# Patient Record
Sex: Male | Born: 1980 | Race: Black or African American | Hispanic: No | Marital: Married | Smoking: Current some day smoker
Health system: Southern US, Community
[De-identification: ages and names within clinical notes are randomized; demographics above are authoritative.]

## PROBLEM LIST (undated history)

## (undated) DIAGNOSIS — F32A Depression, unspecified: Secondary | ICD-10-CM

## (undated) DIAGNOSIS — R55 Syncope and collapse: Secondary | ICD-10-CM

## (undated) DIAGNOSIS — E785 Hyperlipidemia, unspecified: Secondary | ICD-10-CM

## (undated) DIAGNOSIS — D860 Sarcoidosis of lung: Secondary | ICD-10-CM

## (undated) DIAGNOSIS — I10 Essential (primary) hypertension: Secondary | ICD-10-CM

## (undated) DIAGNOSIS — Z9109 Other allergy status, other than to drugs and biological substances: Secondary | ICD-10-CM

## (undated) HISTORY — DX: Depression, unspecified: F32.A

## (undated) HISTORY — DX: Sarcoidosis of lung: D86.0

## (undated) HISTORY — PX: LUNG BIOPSY: SHX232

## (undated) HISTORY — DX: Essential (primary) hypertension: I10

## (undated) HISTORY — DX: Hyperlipidemia, unspecified: E78.5

## (undated) HISTORY — DX: Syncope and collapse: R55

## (undated) HISTORY — PX: WISDOM TOOTH EXTRACTION: SHX21

## (undated) HISTORY — DX: Other allergy status, other than to drugs and biological substances: Z91.09

## (undated) HISTORY — PX: SHOULDER SURGERY: SHX246

---

## 2011-06-10 DIAGNOSIS — M25569 Pain in unspecified knee: Secondary | ICD-10-CM | POA: Insufficient documentation

## 2011-06-10 DIAGNOSIS — M1710 Unilateral primary osteoarthritis, unspecified knee: Secondary | ICD-10-CM | POA: Insufficient documentation

## 2011-07-25 DIAGNOSIS — J45909 Unspecified asthma, uncomplicated: Secondary | ICD-10-CM | POA: Insufficient documentation

## 2011-07-25 DIAGNOSIS — D869 Sarcoidosis, unspecified: Secondary | ICD-10-CM | POA: Insufficient documentation

## 2011-12-23 DIAGNOSIS — M722 Plantar fascial fibromatosis: Secondary | ICD-10-CM | POA: Insufficient documentation

## 2012-01-28 DIAGNOSIS — Z9109 Other allergy status, other than to drugs and biological substances: Secondary | ICD-10-CM | POA: Insufficient documentation

## 2012-01-29 DIAGNOSIS — F17200 Nicotine dependence, unspecified, uncomplicated: Secondary | ICD-10-CM | POA: Insufficient documentation

## 2014-01-01 ENCOUNTER — Emergency Department (HOSPITAL_COMMUNITY)
Admission: EM | Admit: 2014-01-01 | Discharge: 2014-01-02 | Disposition: A | Discharge status: Home / Self Care | Payer: BC Managed Care – PPO | Attending: Emergency Medicine | Admitting: Emergency Medicine

## 2014-01-01 ENCOUNTER — Encounter (HOSPITAL_COMMUNITY): Payer: Self-pay | Admitting: Emergency Medicine

## 2014-01-01 DIAGNOSIS — Z720 Tobacco use: Secondary | ICD-10-CM | POA: Insufficient documentation

## 2014-01-01 DIAGNOSIS — R51 Headache: Secondary | ICD-10-CM | POA: Diagnosis present

## 2014-01-01 DIAGNOSIS — R110 Nausea: Secondary | ICD-10-CM | POA: Diagnosis not present

## 2014-01-01 DIAGNOSIS — R519 Headache, unspecified: Secondary | ICD-10-CM

## 2014-01-01 LAB — CBC WITH DIFFERENTIAL/PLATELET
Basophils Absolute: 0 10*3/uL (ref 0.0–0.1)
Basophils Relative: 0 % (ref 0–1)
EOS ABS: 0.2 10*3/uL (ref 0.0–0.7)
Eosinophils Relative: 4 % (ref 0–5)
HCT: 43.1 % (ref 39.0–52.0)
HEMOGLOBIN: 15.1 g/dL (ref 13.0–17.0)
Lymphocytes Relative: 36 % (ref 12–46)
Lymphs Abs: 1.6 10*3/uL (ref 0.7–4.0)
MCH: 27.9 pg (ref 26.0–34.0)
MCHC: 35 g/dL (ref 30.0–36.0)
MCV: 79.5 fL (ref 78.0–100.0)
MONOS PCT: 8 % (ref 3–12)
Monocytes Absolute: 0.4 10*3/uL (ref 0.1–1.0)
NEUTROS PCT: 52 % (ref 43–77)
Neutro Abs: 2.3 10*3/uL (ref 1.7–7.7)
Platelets: 206 10*3/uL (ref 150–400)
RBC: 5.42 MIL/uL (ref 4.22–5.81)
RDW: 13.2 % (ref 11.5–15.5)
WBC: 4.5 10*3/uL (ref 4.0–10.5)

## 2014-01-01 MED ORDER — METOCLOPRAMIDE HCL 5 MG/ML IJ SOLN
10.0000 mg | Freq: Once | INTRAMUSCULAR | Status: AC
  Administered 2014-01-01: 10 mg via INTRAVENOUS
  Filled 2014-01-01: qty 2

## 2014-01-01 MED ORDER — SODIUM CHLORIDE 0.9 % IV BOLUS (SEPSIS)
1000.0000 mL | Freq: Once | INTRAVENOUS | Status: AC
  Administered 2014-01-01: 1000 mL via INTRAVENOUS

## 2014-01-01 MED ORDER — DIPHENHYDRAMINE HCL 50 MG/ML IJ SOLN
50.0000 mg | Freq: Once | INTRAMUSCULAR | Status: AC
Start: 2014-01-01 — End: 2014-01-01
  Administered 2014-01-01: 50 mg via INTRAVENOUS
  Filled 2014-01-01: qty 1

## 2014-01-01 MED ORDER — KETOROLAC TROMETHAMINE 30 MG/ML IJ SOLN
30.0000 mg | Freq: Once | INTRAMUSCULAR | Status: AC
  Administered 2014-01-01: 30 mg via INTRAVENOUS
  Filled 2014-01-01: qty 1

## 2014-01-01 MED ORDER — OXYCODONE-ACETAMINOPHEN 5-325 MG PO TABS
1.0000 | ORAL_TABLET | Freq: Once | ORAL | Status: AC
  Administered 2014-01-01: 1 via ORAL
  Filled 2014-01-01: qty 1

## 2014-01-01 NOTE — ED Provider Notes (Signed)
CSN: [id number]     Arrival date & time 01/01/14  1820 History   First MD Initiated Contact with Patient 01/01/14 2255     Chief Complaint  Patient presents with  . Headache  . Nausea     (Consider location/radiation/quality/duration/timing/severity/associated sxs/prior Treatment) HPI Dominic Joseph is a 33 y.o. male with no significant past medical history coming in with a headache. Patient states this occurred 4 days ago, and was sudden onset. He does not have a history of headaches and states it was at its worse 4 days ago. He has been off and on since then, feels goal with episodes of sharp pains. He describes left hand numbness and tingling black spots as well. One day while going to work, he states he got lost leaving from his house which never happened to him. He denies fevers but admits to chills. He states he feels weak all over. 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 He also has a positive family history of brain aneurysms. Patient also had lightheadedness when he stands. He denies any chest pain or shortness of breath. Patient used B.C. powder without any relief. Patient has no further complaints.  10 Systems reviewed and are negative for acute change except as noted in the HPI.     History reviewed. No pertinent past medical history. History reviewed. No pertinent past surgical history. No family history on file. History  Substance Use Topics  . Smoking status: Current Every Day Smoker -- 1.00 packs/day    Types: Cigarettes  . Smokeless tobacco: Not on file  . Alcohol Use: No    Review of Systems    Allergies  Review of patient's allergies indicates no known allergies.  Home Medications   Prior to Admission medications   Not on File   BP 123/70  Pulse 53  Temp(Src) 98.5 F (36.9 C) (Oral)  Resp 15  Ht 6\' 3"  (1.905 m)  Wt 253 lb 7 oz (114.958 kg)  BMI 31.68 kg/m2  SpO2 100% Physical Exam  Nursing note and vitals  reviewed. Constitutional: He is oriented to person, place, and time. Vital signs are normal. He appears well-developed and well-nourished.  Non-toxic appearance. He does not appear ill. No distress.  HENT:  Head: Normocephalic and atraumatic.  Nose: Nose normal.  Mouth/Throat: Oropharynx is clear and moist. No oropharyngeal exudate.  Eyes: Conjunctivae and EOM are normal. Pupils are equal, round, and reactive to light. No scleral icterus.  Neck: Normal range of motion. Neck supple. No tracheal deviation, no edema, no erythema and normal range of motion present. No mass and no thyromegaly present.  Cardiovascular: Normal rate, regular rhythm, S1 normal, S2 normal, normal heart sounds, intact distal pulses and normal pulses.  Exam reveals no gallop and no friction rub.   No murmur heard. Pulses:      Radial pulses are 2+ on the right side, and 2+ on the left side.       Dorsalis pedis pulses are 2+ on the right side, and 2+ on the left side.  Pulmonary/Chest: Effort normal and breath sounds normal. No respiratory distress. He has no wheezes. He has no rhonchi. He has no rales.  Abdominal: Soft. Normal appearance and bowel sounds are normal. He exhibits no distension, no ascites and no mass. There is no hepatosplenomegaly. There is no tenderness. There is no rebound, no guarding and no CVA tenderness.  Musculoskeletal: Normal range of motion. He exhibits no edema and no tenderness.  Lymphadenopathy:    He has no cervical adenopathy.  Neurological: He is alert and oriented to person, place, and time. He has normal strength. No cranial nerve deficit or sensory deficit. He exhibits normal muscle tone. Coordination normal. GCS eye subscore is 4. GCS verbal subscore is 5. GCS motor subscore is 6.  Normal sensation and 5 out of 5 strength x4 extremities. Normal cerebellar testing.  Skin: Skin is warm, dry and intact. No petechiae and no rash noted. He is not diaphoretic. No erythema. No pallor.   Psychiatric: He has a normal mood and affect. His behavior is normal. Judgment normal.    ED Course  LUMBAR PUNCTURE Date/Time: 01/02/2014 1:27 AM Performed by: Tomasita CrumbleNI, Annlee Glandon Authorized by: Tomasita CrumbleNI, Arwin Bisceglia Consent: Verbal consent obtained. written consent obtained. Risks and benefits: risks, benefits and alternatives were discussed Consent given by: patient Patient understanding: patient states understanding of the procedure being performed Patient consent: the patient's understanding of the procedure matches consent given Procedure consent: procedure consent matches procedure scheduled Relevant documents: relevant documents present and verified Test results: test results available and properly labeled Site marked: the operative site was marked Imaging studies: imaging studies available Patient identity confirmed: verbally with patient and hospital-assigned identification number Time out: Immediately prior to procedure a "time out" was called to verify the correct patient, procedure, equipment, support staff and site/side marked as required. Indications: evaluation for subarachnoid hemorrhage and evaluation for altered mental status Anesthesia: local infiltration Local anesthetic: lidocaine 1% with epinephrine Anesthetic total: 4 ml Patient sedated: no Preparation: Patient was prepped and draped in the usual sterile fashion. Lumbar space: L3-L4 interspace Patient's position: right lateral decubitus Needle gauge: 22 Needle type: spinal needle - Quincke tip Number of attempts: 2 Opening pressure: 25 cm H2O Fluid appearance: clear Tubes of fluid: 4 Total volume: 4 ml Post-procedure: site cleaned, pressure dressing applied and adhesive bandage applied Patient tolerance: Patient tolerated the procedure well with no immediate complications.   (including critical care time) Labs Review Labs Reviewed  BASIC METABOLIC PANEL - Abnormal; Notable for the following:    GFR calc non Af Amer  82 (*)    All other components within normal limits  CSF CELL COUNT WITH DIFFERENTIAL - Abnormal; Notable for the following:    Appearance, CSF CLEAR (*)    RBC Count, CSF 186 (*)    All other components within normal limits  CSF CELL COUNT WITH DIFFERENTIAL - Abnormal; Notable for the following:    Appearance, CSF CLEAR (*)    RBC Count, CSF 2 (*)    All other components within normal limits  GRAM STAIN  CSF CULTURE  CBC WITH DIFFERENTIAL  GLUCOSE, CSF  PROTEIN, CSF    Imaging Review Ct Head Wo Contrast  01/02/2014   CLINICAL DATA:  Headache on the left for 4 days. Nausea. Initial encounter.  EXAM: CT HEAD WITHOUT CONTRAST  TECHNIQUE: Contiguous axial images were obtained from the base of the skull through the vertex without intravenous contrast.  COMPARISON:  None.  FINDINGS: Skull and Sinuses:Negative for fracture or destructive process. There is patchy inflammatory mucosal thickening in bilateral ethmoid sinuses without sinus effusion.  Orbits: No acute abnormality.  Brain: No evidence of acute abnormality, such as acute infarction, hemorrhage, hydrocephalus, or mass lesion/mass effect.  IMPRESSION: 1. No acute intracranial findings. 2. Mild ethmoid sinusitis.   Electronically Signed   By: Tiburcio PeaJonathan  Watts M.D.   On: 01/02/2014 00:28     EKG Interpretation None  MDM   Final diagnoses:  None    Patient presents emergency department for headache, left hand numbness and seeing black spots.   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 Will obtain CT scan and perform lumbar puncture if negative.  Laboratory values and CT scan of head were negative. Lumbar puncture performed did not reveal any viral meningitis or subarachnoid hemorrhage. Patient's headache is improved after Reglan and Toradol. Will be  sent home with a prescription and advised to followup with primary care physician within 3 days for continued evaluation. His vital signs remain within his normal limits and is safe for discharge.   Tomasita CrumbleAdeleke Ulani Degrasse, MD 01/02/14 0300

## 2014-01-01 NOTE — ED Notes (Signed)
Pt presents for headache to the left side of head for the past 4 days. Woke up with the HA. Has some tingling down his left arm. Nauseated. No vomiting. Seeing spots. Sensitive to light and sounds. No hx of migraines.

## 2014-01-01 NOTE — ED Notes (Signed)
EDP at bedside  

## 2014-01-02 ENCOUNTER — Emergency Department (HOSPITAL_COMMUNITY): Payer: BC Managed Care – PPO

## 2014-01-02 DIAGNOSIS — R51 Headache: Secondary | ICD-10-CM | POA: Diagnosis not present

## 2014-01-02 LAB — GRAM STAIN: Gram Stain: NONE SEEN

## 2014-01-02 LAB — BASIC METABOLIC PANEL
Anion gap: 13 (ref 5–15)
BUN: 17 mg/dL (ref 6–23)
CALCIUM: 9 mg/dL (ref 8.4–10.5)
CO2: 22 mEq/L (ref 19–32)
Chloride: 105 mEq/L (ref 96–112)
Creatinine, Ser: 1.15 mg/dL (ref 0.50–1.35)
GFR calc Af Amer: >90 mL/min (ref >90)
GFR, EST NON AFRICAN AMERICAN: 82 mL/min — AB (ref >90)
GLUCOSE: 93 mg/dL (ref 70–99)
POTASSIUM: 4.1 meq/L (ref 3.7–5.3)
Sodium: 140 mEq/L (ref 137–147)

## 2014-01-02 LAB — CSF CELL COUNT WITH DIFFERENTIAL
RBC COUNT CSF: 186 /mm3 — AB
RBC Count, CSF: 2 /mm3 — ABNORMAL HIGH
TUBE #: 4
Tube #: 1
WBC CSF: 0 /mm3 (ref 0–5)
WBC, CSF: 0 /mm3 (ref 0–5)

## 2014-01-02 LAB — GLUCOSE, CSF: Glucose, CSF: 62 mg/dL (ref 43–76)

## 2014-01-02 LAB — PROTEIN, CSF: TOTAL PROTEIN, CSF: 29 mg/dL (ref 15–45)

## 2014-01-02 MED ORDER — LIDOCAINE-EPINEPHRINE 1 %-1:100000 IJ SOLN
10.0000 mL | Freq: Once | INTRAMUSCULAR | Status: AC
  Administered 2014-01-02: 10 mL via INTRADERMAL
  Filled 2014-01-02: qty 1

## 2014-01-02 MED ORDER — METOCLOPRAMIDE HCL 10 MG PO TABS: 10.0000 mg | ORAL_TABLET | Freq: Two times a day (BID) | ORAL | Status: DC | PRN

## 2014-01-02 NOTE — Discharge Instructions (Signed)
General Headache Without Cause Dominic Joseph, you were seen today for headache. Your labs, CT scan, and spinal tasks were all normal. Continue to take pain medication at home as needed and followup with your regular Dr. within 3 days for continued treatment of your headache. If any of her symptoms worsen or he develop numbness, muscle weakness, come back to the emergency department for repeat evaluation. Thank you. A headache is pain or discomfort felt around the head or neck area. The specific cause of a headache may not be found. There are many causes and types of headaches. A few common ones are:  Tension headaches.  Migraine headaches.  Cluster headaches.  Chronic daily headaches. HOME CARE INSTRUCTIONS   Keep all follow-up appointments with your caregiver or any specialist referral.  Only take over-the-counter or prescription medicines for pain or discomfort as directed by your caregiver.  Lie down in a dark, quiet room when you have a headache.  Keep a headache journal to find out what may trigger your migraine headaches. For example, write down:  What you eat and drink.  How much sleep you get.  Any change to your diet or medicines.  Try massage or other relaxation techniques.  Put ice packs or heat on the head and neck. Use these 3 to 4 times per day for 15 to 20 minutes each time, or as needed.  Limit stress.  Sit up straight, and do not tense your muscles.  Quit smoking if you smoke.  Limit alcohol use.  Decrease the amount of caffeine you drink, or stop drinking caffeine.  Eat and sleep on a regular schedule.  Get 7 to 9 hours of sleep, or as recommended by your caregiver.  Keep lights dim if bright lights bother you and make your headaches worse. SEEK MEDICAL CARE IF:   You have problems with the medicines you were prescribed.  Your medicines are not working.  You have a change from the usual headache.  You have nausea or vomiting. SEEK IMMEDIATE MEDICAL  CARE IF:   Your headache becomes severe.  You have a fever.  You have a stiff neck.  You have loss of vision.  You have muscular weakness or loss of muscle control.  You start losing your balance or have trouble walking.  You feel faint or pass out.  You have severe symptoms that are different from your first symptoms. MAKE SURE YOU:   Understand these instructions.  Will watch your condition.  Will get help right away if you are not doing well or get worse. Document Released: 03/10/2005 Document Revised: 06/02/2011 Document Reviewed: 03/26/2011 [facility] Patient Information 2015 [place], [place]. This information is not intended to replace advice given to you by your health care provider. Make sure you discuss any questions you have with your health care provider.  Emergency Department Resource Guide 1) Find a Doctor and Pay Out of Pocket Although you won't have to find out who is covered by your insurance plan, it is a good idea to ask around and get recommendations. You will then need to call the office and see if the doctor you have chosen will accept you as a new patient and what types of options they offer for patients who are self-pay. Some doctors offer discounts or will set up payment plans for their patients who do not have insurance, but you will need to ask so you aren't surprised when you get to your appointment.  2) Contact Your Local Health Department Not  all health departments have doctors that can see patients for sick visits, but many do, so it is worth a call to see if yours does. If you don't know where your local health department is, you can check in your phone book. The CDC also has a tool to help you locate your state's health department, and many state websites also have listings of all of their local health departments.  3) Find a Walk-in Clinic If your illness is not likely to be very severe or complicated, you may want to try a walk in clinic. These are  popping up all over the country in pharmacies, drugstores, and shopping centers. They're usually staffed by nurse practitioners or physician assistants that have been trained to treat common illnesses and complaints. They're usually fairly quick and inexpensive. However, if you have serious medical issues or chronic medical problems, these are probably not your best option.  No Primary Care Doctor: - Call Health Connect at  [phone] - they can help you locate a primary care doctor that  accepts your insurance, provides certain services, etc. - Physician Referral Service- [phone]  Chronic Pain Problems: Organization         Address  Phone   Notes  [facility]  [phone] Patients need to be referred by their primary care doctor.   Medication Assistance: Organization         Address  Phone   Notes  [facility] Medication [facility] [address] [place] [place], [place] [address] [phone] --Must be a resident of [facility] -- Must have NO insurance coverage whatsoever (no Medicaid/ Medicare, etc.) -- The pt. MUST have a primary care doctor that directs their care regularly and follows them in the community   [facility]ist  [phone]   [employer]  [phone]    Agencies that provide inexpensive medical care: Organization         Address  Phone   Notes  [facility]  [phone]   [facility]    [phone]   [facility] [address] [place], [place] [address] [phone]   [facility] [address] [place], [place] [phone]   Planned Parenthood    [phone]   [facility]    [phone]   Community Health and [facility]  [address], [place] Phone:  [phone], Fax:  [phone] Hours of Operation:  9 am - 6 pm, M-F.  Also accepts Medicaid/Medicare and self-pay.  [facility]  [address], [place] Phone: [phone], Fax: [phone]. Hours of Operation:  8:30 am - 5:30 pm, M-F.  Also accepts Medicaid and self-pay.  [facility] [place] [address], [place] Phone: [phone]   [facility] [address] Natasha Bence [place], [place] [phone], Ext. [phone] Mondays & Thursdays: 7-9 AM.  First 15 patients are seen on a first come, first serve basis.    Medicaid-accepting [facility] Providers:  Organization         Address  Phone   Notes  [facility] [address], [place] [phone] Also accepts self-pay patients.  [facility] [address] Laurell Josephs [place], [place]  [phone]   [facility] [address], [place] [phone]   [facility] [address], [place] [phone]   Renaye Rakers [address], [place]   [phone] Only accepts [place] patients after they have their name applied to their card.   Self-Pay (no insurance) in [facility]:  Organization         Address  Phone   Notes  Sickle Cell Patients, [facility] [address] [place], [place] [phone]   [facility] [address] [place], [place] [phone]   [facility]  [address], [place] [phone]   [facility]/Dr. Osei-Bonsu  [address] [place] or [address], [place] [phone] Phone number for both [place] and [place] locations is the same.  [facility] [address] [place] [phone]   [facility] [address], [place] or [address] [phone]   [facility] [address], [place] [phone], phone; [phone], fax Sees patients 1st and 3rd Saturday  of every month.  Must not qualify for public or private insurance (i.e. Medicaid, Medicare, [place] Health Choice, Veterans' Benefits)  Household income should be no more than 200% of the poverty level The clinic cannot treat you if you are pregnant or think you are pregnant  Sexually transmitted diseases are not treated at the clinic.    Dental Care: Organization         Address  Phone  Notes  [facility] Department of [facility] [address] [place], [place] [phone] Accepts children up to age 61 who are enrolled in [place] or [place] Health Choice; pregnant women with a Medicaid card; and children who have applied for Medicaid or [place] Health Choice, but were declined, whose parents can pay a reduced fee at time of service.  [facility] Department of [facility]  [address], [place] [phone] Accepts children up to age 67 who are enrolled in [place] or [place] Health Choice; pregnant women with a Medicaid card; and children who have applied for Medicaid or [place] Health Choice, but were declined, whose parents can pay a reduced fee at time of service.  [facility]  [address] [place], [place] [phone] Patients are seen by appointment only. Walk-ins are not accepted. [facility] will see patients 22 years of age and older. Monday - Tuesday (8am-5pm) Most Wednesdays (8:30-5pm) $30 per visit, cash only  [facility]  [address], [facility] [phone] Patients are seen by appointment only. Walk-ins are not accepted. [facility] will see patients 9 years of age and older. One Wednesday Evening (Monthly: Volunteer Based).  $30 per visit, cash only  [employer]  [phone] for adults; Children under age 60, call Graduate Pediatric Dentistry at [phone]. Children aged 63-14, please call [phone] to request a pediatric application.   Dental services are provided in all areas of dental care including fillings, crowns and bridges, complete and partial dentures, implants, gum treatment, root canals, and extractions. Preventive care is also provided. Treatment is provided to both adults and children. Patients are selected via a lottery and there is often a waiting list.   [facility] [address] [place]  [phone] [email or web address]  [facility] [address], [place], [place] [phone], Ext. [phone] Second and Fourth Thursday of each month, opens at 6:30 AM; Clinic ends at 9 AM.  Patients are seen on a first-come first-served basis, and a limited number are seen during each clinic.   [facility]  [address], [place], [place] [phone]) [phone]   Eligibility Requirements You must have lived in [place], [place], or [place] counties for at least the last three months.   You cannot be eligible for state or federal sponsored National Cityhealthcare insurance, including CIG[employer]eterans Administration, [place]icaid, or [employer]icare.   You generally cannot be eligible for healthcare insurance through your employer.    How to apply: Eligibility screenings are held every Tuesday and Wednesday afternoon from 1:00 pm until 4:00 pm. You do not need an appointment for the interview!  [facility] [address], [place], [place] [phone]   [facility]  [phone]   [facility]  [phone]   [facility]  [phone]    Behavioral Health Resources in the Community: Intensive Outpatient Programs Organization         Address  Phone  Notes  [facility] [address], [place], [place] [phone]   [facility] [address], [place], [place] [phone]   [facility] [address], [place], [place]  [phone]   [facility] [address],  [place], [place] [phone] or [phone]   Substance Abuse Resources Organization         Address  Phone  Notes  [facility]  [phone]   [facility]  [phone]   [facility]  [phone]   [facility]  [phone]   Residential & Outpatient Substance Abuse Program  [phone]   Psychological Services Organization         Address  Phone  Notes  [facility]  [phone]- [phone]   [facility]  [phone]- [phone]   [facility] [address], [place] [phone] or [phone]    Mobile Crisis Teams Organization         Address  Phone  Notes  [facility], Mobile Crisis Care Unit  [phone]   [facility]  [address] [place], [place] [phone]   Doristine LocksSharon DeEsch [address] [place] [phone]    Self-Help/Support Groups Organization         Address  Phone             Notes  Mental Health Assoc. of Agency - variety of support groups  336- [phone] Call for more information  Narcotics Anonymous (NA), [facility] [address], [place]  2 meetings at this location   Statisticianesidential Treatment Programs Organization         Address  Phone  Notes  [facility] [address],    [place]  [phone]   [facility]  [address], [place] [phone], [place], [place] [phone]   [facility] [address], [place] [phone] Admissions: 8am-3pm M-F  [facility] [address]    [place], [place] [phone]   [facility] [address], [place], [place] [phone]   [facility] [address]  [place], [place] [phone]   [facility] - Intensive Outpatient [phone]  [address], [place], [place] [phone]   [facility] ([facility] [address]  [place], [place]  [phone] or [phone]   [facility] ([facility]) [address] [place], [place] [phone] Accepts Medicaid  [facility] [address]  [place] [phone] Substance Abuse/Addiction Treatment   [facility] Resources Organization         Address  Phone  Notes  [facility]  [phone]   Angie FavaJulie Brannon, PhD [address], [address] [place], [place]   [phone] or [phone]   [facility]   [address] [place], [place] [phone](336) [phone]   [facility] [address], [place], [place] [phone] Insurance/Medicaid/sponsorship through [facility]  [facility] [address]                                    [place], [place] [phone] Therapy/tele-psych/case  [facility] [address]   [place], [place] [phone]) [phone]    Dr. Lolly MustacheArfeen  [phone](336) [phone]   [facility]  [employer] [facility] 1) [address], [place] 2) [address], [place] 3)  [address] [address], [place] [phone]  [phone]   [facility] Child Abuse Hotline [phone]) [phone] or [phone] (After Hours)

## 2014-01-02 NOTE — ED Notes (Signed)
Patient transported to CT 

## 2014-01-05 LAB — CSF CULTURE
CULTURE: NO GROWTH
GRAM STAIN: NONE SEEN

## 2014-01-05 LAB — CSF CULTURE W GRAM STAIN

## 2014-10-24 ENCOUNTER — Other Ambulatory Visit: Payer: Self-pay | Admitting: Physician Assistant

## 2014-10-24 DIAGNOSIS — M25562 Pain in left knee: Secondary | ICD-10-CM

## 2014-10-26 ENCOUNTER — Ambulatory Visit
Admission: RE | Admit: 2014-10-26 | Discharge: 2014-10-26 | Disposition: A | Discharge status: Home / Self Care | Payer: BLUE CROSS/BLUE SHIELD | Source: Ambulatory Visit | Attending: Physician Assistant | Admitting: Physician Assistant

## 2014-10-26 DIAGNOSIS — M25562 Pain in left knee: Secondary | ICD-10-CM

## 2015-03-02 ENCOUNTER — Other Ambulatory Visit: Payer: Self-pay | Admitting: Family Medicine

## 2015-03-02 ENCOUNTER — Ambulatory Visit
Admission: RE | Admit: 2015-03-02 | Discharge: 2015-03-02 | Disposition: A | Discharge status: Home / Self Care | Payer: BLUE CROSS/BLUE SHIELD | Source: Ambulatory Visit | Attending: Family Medicine | Admitting: Family Medicine

## 2015-03-02 DIAGNOSIS — R05 Cough: Secondary | ICD-10-CM

## 2015-03-02 DIAGNOSIS — R059 Cough, unspecified: Secondary | ICD-10-CM

## 2015-09-03 ENCOUNTER — Other Ambulatory Visit: Payer: Self-pay | Admitting: Family Medicine

## 2015-09-03 DIAGNOSIS — R519 Headache, unspecified: Secondary | ICD-10-CM

## 2015-09-03 DIAGNOSIS — R51 Headache: Principal | ICD-10-CM

## 2015-09-10 ENCOUNTER — Ambulatory Visit (HOSPITAL_COMMUNITY)
Admission: RE | Admit: 2015-09-10 | Discharge: 2015-09-10 | Disposition: A | Discharge status: Home / Self Care | Payer: BLUE CROSS/BLUE SHIELD | Source: Ambulatory Visit | Attending: Family Medicine | Admitting: Family Medicine

## 2015-09-10 DIAGNOSIS — R51 Headache: Secondary | ICD-10-CM | POA: Insufficient documentation

## 2015-09-10 DIAGNOSIS — R519 Headache, unspecified: Secondary | ICD-10-CM

## 2016-08-13 ENCOUNTER — Emergency Department (HOSPITAL_COMMUNITY): Payer: BLUE CROSS/BLUE SHIELD

## 2016-08-13 ENCOUNTER — Encounter (HOSPITAL_COMMUNITY): Payer: Self-pay | Admitting: Emergency Medicine

## 2016-08-13 ENCOUNTER — Emergency Department (HOSPITAL_COMMUNITY)
Admission: EM | Admit: 2016-08-13 | Discharge: 2016-08-13 | Disposition: A | Discharge status: Home / Self Care | Payer: BLUE CROSS/BLUE SHIELD | Attending: Emergency Medicine | Admitting: Emergency Medicine

## 2016-08-13 DIAGNOSIS — Y93B9 Activity, other involving muscle strengthening exercises: Secondary | ICD-10-CM | POA: Diagnosis not present

## 2016-08-13 DIAGNOSIS — S2222XA Fracture of body of sternum, initial encounter for closed fracture: Secondary | ICD-10-CM

## 2016-08-13 DIAGNOSIS — S298XXA Other specified injuries of thorax, initial encounter: Secondary | ICD-10-CM

## 2016-08-13 DIAGNOSIS — W228XXA Striking against or struck by other objects, initial encounter: Secondary | ICD-10-CM | POA: Insufficient documentation

## 2016-08-13 DIAGNOSIS — Y999 Unspecified external cause status: Secondary | ICD-10-CM | POA: Insufficient documentation

## 2016-08-13 DIAGNOSIS — F17210 Nicotine dependence, cigarettes, uncomplicated: Secondary | ICD-10-CM | POA: Insufficient documentation

## 2016-08-13 DIAGNOSIS — Y9239 Other specified sports and athletic area as the place of occurrence of the external cause: Secondary | ICD-10-CM | POA: Diagnosis not present

## 2016-08-13 DIAGNOSIS — S299XXA Unspecified injury of thorax, initial encounter: Secondary | ICD-10-CM | POA: Diagnosis present

## 2016-08-13 LAB — I-STAT CHEM 8, ED
BUN: 10 mg/dL (ref 6–20)
CALCIUM ION: 1.18 mmol/L (ref 1.15–1.40)
CREATININE: 1.1 mg/dL (ref 0.61–1.24)
Chloride: 104 mmol/L (ref 101–111)
GLUCOSE: 99 mg/dL (ref 65–99)
HCT: 47 % (ref 39.0–52.0)
Hemoglobin: 16 g/dL (ref 13.0–17.0)
POTASSIUM: 4 mmol/L (ref 3.5–5.1)
Sodium: 142 mmol/L (ref 135–145)
TCO2: 28 mmol/L (ref 0–100)

## 2016-08-13 MED ORDER — HYDROMORPHONE HCL 1 MG/ML IJ SOLN
1.0000 mg | Freq: Once | INTRAMUSCULAR | Status: AC
  Administered 2016-08-13: 1 mg via INTRAVENOUS
  Filled 2016-08-13: qty 1

## 2016-08-13 MED ORDER — KETOROLAC TROMETHAMINE 30 MG/ML IJ SOLN
30.0000 mg | Freq: Once | INTRAMUSCULAR | Status: AC
  Administered 2016-08-13: 30 mg via INTRAVENOUS
  Filled 2016-08-13: qty 1

## 2016-08-13 MED ORDER — OXYCODONE-ACETAMINOPHEN 5-325 MG PO TABS
2.0000 | ORAL_TABLET | Freq: Once | ORAL | Status: AC
  Administered 2016-08-13: 2 via ORAL
  Filled 2016-08-13: qty 2

## 2016-08-13 MED ORDER — IOPAMIDOL (ISOVUE-300) INJECTION 61%
INTRAVENOUS | Status: AC
  Administered 2016-08-13: 75 mL via INTRAVENOUS
  Filled 2016-08-13: qty 75

## 2016-08-13 MED ORDER — IBUPROFEN 800 MG PO TABS: 800.0000 mg | ORAL_TABLET | Freq: Three times a day (TID) | ORAL | 0 refills | Status: DC

## 2016-08-13 MED ORDER — OXYCODONE-ACETAMINOPHEN 5-325 MG PO TABS: 1.0000 | ORAL_TABLET | ORAL | 0 refills | Status: DC | PRN

## 2016-08-13 MED ORDER — MORPHINE SULFATE (PF) 4 MG/ML IV SOLN
2.0000 mg | Freq: Once | INTRAVENOUS | Status: AC
Start: 2016-08-13 — End: 2016-08-13
  Administered 2016-08-13: 2 mg via INTRAVENOUS
  Filled 2016-08-13: qty 1

## 2016-08-13 NOTE — ED Triage Notes (Signed)
Pt. reported that a heavy barbell slipped from his left hand and hit his mid chest while at the gym this morning , respirations unlabored , pain increases with movement/palpation .

## 2016-08-13 NOTE — ED Provider Notes (Signed)
[facility] Provider Note   CSN: [id number] Arrival date & time: 08/13/16  0500    History   Chief Complaint Chief Complaint  Patient presents with  . Chest Injury    HPI Dominic Joseph is a 36 y.o. male.  36 year old male presents to the emergency department for evaluation of chest pain which began 30 minutes prior to arrival. Pain began after patient dropped a 225 pound barbell on his chest while bench pressing. Patient denies loss of consciousness. He has had constant chest pain which is nonradiating and worse with movement as well as deep breathing. No medications taken prior to arrival.   The history is provided by the patient. No language interpreter was used.    History reviewed. No pertinent past medical history.  There are no active problems to display for this patient.   History reviewed. No pertinent surgical history.     Home Medications    Prior to Admission medications   Medication Sig Start Date End Date Taking? Authorizing Provider  metoCLOPramide (REGLAN) 10 MG tablet Take 1 tablet (10 mg total) by mouth 2 (two) times daily as needed (headache). Patient not taking: Reported on 08/13/2016 01/02/14   Tomasita Crumble, MD    Family History No family history on file.  Social History Social History  Substance Use Topics  . Smoking status: Current Every Day Smoker    Packs/day: 1.00    Types: Cigarettes  . Smokeless tobacco: Never Used  . Alcohol use No     Allergies   Patient has no known allergies.   Review of Systems Review of Systems Ten systems reviewed and are negative for acute change, except as noted in the HPI.    Physical Exam Updated Vital Signs BP 124/79   Pulse 64   Temp 98.1 F (36.7 C) (Oral)   Resp 14   SpO2 96%   Physical Exam  Constitutional: He is oriented to person, place, and time. He appears well-developed and well-nourished. No distress.  Nontoxic and in NAD  HENT:  Head: Normocephalic and atraumatic.    Eyes: Conjunctivae and EOM are normal. No scleral icterus.  Neck: Normal range of motion.  Cardiovascular: Normal rate, regular rhythm and intact distal pulses.   Pulmonary/Chest: Effort normal. No respiratory distress. He has no wheezes. He has no rales.  Lungs grossly clear. Chest expansion symmetric. Diffuse tenderness to palpation across the anterior chest. No crepitus or deformity.  Musculoskeletal: Normal range of motion.  Neurological: He is alert and oriented to person, place, and time. He exhibits normal muscle tone. Coordination normal.  Ambulatory with steady gait.  Skin: Skin is warm and dry. No rash noted. He is not diaphoretic. No erythema. No pallor.  Psychiatric: He has a normal mood and affect. His behavior is normal.  Nursing note and vitals reviewed.    ED Treatments / Results  Labs (all labs ordered are listed, but only abnormal results are displayed) Labs Reviewed  I-STAT CHEM 8, ED    EKG  EKG Interpretation None       Radiology Dg Chest 2 View  Result Date: 08/13/2016 CLINICAL DATA:  Sternal chest pain and shortness of breath this morning. EXAM: CHEST  2 VIEW COMPARISON:  03/02/2015 FINDINGS: Shallow inspiration. Cardiac enlargement without vascular congestion. Probable atelectasis in the lung bases. No focal consolidation. No blunting of costophrenic angles. No pneumothorax. Mediastinal contours appear intact. IMPRESSION: Shallow inspiration. Cardiac enlargement. Atelectasis in the lung bases. No focal consolidation. Electronically Signed  By: Burman NievesWilliam  Stevens M.D.   On: 08/13/2016 06:32    Procedures Procedures (including critical care time)  Medications Ordered in ED Medications  ketorolac (TORADOL) 30 MG/ML injection 30 mg (not administered)  morphine 4 MG/ML injection 2 mg (not administered)  oxyCODONE-acetaminophen (PERCOCET/ROXICET) 5-325 MG per tablet 2 tablet (2 tablets Oral Given 08/13/16 [id number])     Initial Impression / Assessment and  Plan / ED Course  I have reviewed the triage vital signs and the nursing notes.  Pertinent labs & imaging results that were available during my care of the patient were reviewed by me and considered in my medical decision making (see chart for details).     36 year old male presents to the emergency department for evaluation of chest pain after he dropped a 225 pound barbell on his chest while then she pressing 30 minutes PTA. Chest x-ray suboptimal given difficulty with deep breathing. No obvious deformities such as pneumothorax, sternal fracture, or rib fracture. Plan to better characterized with CT chest.  Patient signed out to Sharilyn SitesLisa Sanders, PA-C at change of shift with imaging pending. Allyne GeeSanders, PA-C to disposition appropriately. Toradol and morphine ordered for pain control.   Final Clinical Impressions(s) / ED Diagnoses   Final diagnoses:  Blunt chest trauma, initial encounter    New Prescriptions New Prescriptions   No medications on file     Antony MaduraHumes, Dorismar Chay, Cordelia Poche-C 08/13/16 [id number]    Ward, Layla MawKristen N, DO 08/13/16 [phone]

## 2016-08-13 NOTE — ED Provider Notes (Signed)
Assumed care from PA Humes at shift change.  See her note for full H&P.  Briefly, 36 y.o. M here with chest wall injury after dropping barbell on his chest early this morning.  CXR suboptimal study.  Has been given pain control.  Plan:  FU chest CT.  If negative, can discharge home with outpatient follow-up.  Results for orders placed or performed during the hospital encounter of 08/13/16  I-stat chem 8, ed  Result Value Ref Range   Sodium 142 135 - 145 mmol/L   Potassium 4.0 3.5 - 5.1 mmol/L   Chloride 104 101 - 111 mmol/L   BUN 10 6 - 20 mg/dL   Creatinine, Ser 8.651.10 0.61 - 1.24 mg/dL   Glucose, Bld 99 65 - 99 mg/dL   Calcium, Ion 7.841.18 6.961.15 - 1.40 mmol/L   TCO2 28 0 - 100 mmol/L   Hemoglobin 16.0 13.0 - 17.0 g/dL   HCT 29.547.0 28.439.0 - 13.252.0 %   Dg Chest 2 View  Result Date: 08/13/2016 CLINICAL DATA:  Sternal chest pain and shortness of breath this morning. EXAM: CHEST  2 VIEW COMPARISON:  03/02/2015 FINDINGS: Shallow inspiration. Cardiac enlargement without vascular congestion. Probable atelectasis in the lung bases. No focal consolidation. No blunting of costophrenic angles. No pneumothorax. Mediastinal contours appear intact. IMPRESSION: Shallow inspiration. Cardiac enlargement. Atelectasis in the lung bases. No focal consolidation. Electronically Signed   By: Burman NievesWilliam  Stevens M.D.   On: 08/13/2016 06:32   Ct Chest W Contrast  Result Date: 08/13/2016 CLINICAL DATA:  Bar fell onto chest this morning while lifting weights. Shortness of breath when moving. Initial encounter. EXAM: CT CHEST WITH CONTRAST TECHNIQUE: Multidetector CT imaging of the chest was performed during intravenous contrast administration. CONTRAST:  75 cc Isovue 300 intravenous COMPARISON:  None. FINDINGS: Cardiovascular: Normal heart size. No pericardial effusion. No evidence of great vessel injury. Mediastinum/Nodes: Negative for hematoma or pneumomediastinum. Lungs/Pleura: Low volume chest with atelectasis. No hemothorax,  pneumothorax, or lung contusion. Upper Abdomen: No evidence of injury. Musculoskeletal: Nondisplaced mid sternal body fracture. No noted rib fracture. No detectable muscular injury. IMPRESSION: 1. Nondisplaced sternal body fracture. 2. No evidence of intrathoracic injury. 3. Low volume chest with subsegmental atelectasis. Electronically Signed   By: Marnee SpringJonathon  Watts M.D.   On: 08/13/2016 09:22    CT scan chest with nondisplaced sternal body fracture. No signs of pneumothorax or other pulmonary injuries. Patient's vitals remained stable on room air. Oxygen saturations are 96% during exam. After additional Dilaudid, pain has improved. At this time given isolated sternal fracture without complicating features, feel patient is stable for discharge. We'll have him follow up with CT surgery. Will discharge home on pain medication, incentive spirometer. I personally discussed his CT results and plan of care with his boss as he will need some time off from work. Have also provided with a work note.  Discussed plan with patient, he acknowledged understanding and agreed with plan of care.  Return precautions given for new or worsening symptoms.  Case discussed with attending physician, Dr. Freida BusmanAllen, who agrees with care plan.   Garlon HatchetSanders, Delmi Fulfer M, PA-C 08/13/16 1049    Lorre NickAllen, Anthony, MD 08/13/16 [phone]

## 2016-08-13 NOTE — ED Notes (Signed)
Pt in CT.

## 2016-08-13 NOTE — Discharge Instructions (Signed)
Take the prescribed medication as directed for pain.  Make sure you are taking good, deep breaths to prevent formation of pneumonia or other pulmonary issues.  Do not drive while taking pain medication, it can make you drowsy. Follow-up with the cardiothoracic group-- call to make an appt. Return to the ED for new or worsening symptoms.

## 2016-08-15 ENCOUNTER — Other Ambulatory Visit: Payer: Self-pay | Admitting: Cardiothoracic Surgery

## 2016-08-15 DIAGNOSIS — S2220XK Unspecified fracture of sternum, subsequent encounter for fracture with nonunion: Secondary | ICD-10-CM

## 2016-08-19 ENCOUNTER — Encounter: Payer: Self-pay | Admitting: Cardiothoracic Surgery

## 2016-08-19 ENCOUNTER — Institutional Professional Consult (permissible substitution) (INDEPENDENT_AMBULATORY_CARE_PROVIDER_SITE_OTHER): Payer: BLUE CROSS/BLUE SHIELD | Admitting: Cardiothoracic Surgery

## 2016-08-19 VITALS — BP 153/100 | HR 58 | Resp 20 | Ht 75.0 in | Wt 270.0 lb

## 2016-08-19 DIAGNOSIS — S2220XA Unspecified fracture of sternum, initial encounter for closed fracture: Secondary | ICD-10-CM

## 2016-08-19 NOTE — Progress Notes (Signed)
PCP is Gilles Chiquitoabinowitz, Joseph H, MD Referring Provider is Lorre NickAllen, Anthony, MD  Chief Complaint  Patient presents with  . 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 This occurred a week ago when he was lifting weights-bench press and the 225 pound bar slipped out of his left hand and fell on his chest. He was evaluated in the ED. CT scan of the chest showed a non-displaced transverse fracture of the mid sternum. There is no vascular or soft tissue injury. The patient was given a prescription for oxycodone and told to use that with ibuprofen. The patient still has considerable pain. He is not driving. He has difficulty getting up from a supine position. He is almost out of his oxycodone prescription.  The patient denies shortness of breath Patient denies hemoptysis The patient denies difficulty swallowing He does complain of symptoms probably related to constipation with abdominal bloating. The patient took a dose of magnesium citrate with positive results.  Patient is currently on leave. 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  At this point I think he should stay out of work, avoid driving if possible and avoid lifting pushing pulling any significant weight.  The sternum is well aligned and I see no indication for plating or fixation.  Past Medical History:  Diagnosis Date  . Environmental allergies     No past surgical history on file.  Family History  Problem Relation Age of Onset  . Hypertension Mother   . Hyperlipidemia Maternal Grandmother   . Hypertension Maternal Grandmother   . Stroke Maternal Grandmother   . Stroke Maternal Grandfather     Social History Social History  Substance Use Topics  . Smoking status: Former Smoker    Packs/day: 1.00   Types: Cigarettes    Quit date: 04/21/2013  . Smokeless tobacco: Never Used  . Alcohol use No    Current Outpatient Prescriptions  Medication Sig Dispense Refill  . ibuprofen (ADVIL,MOTRIN) 800 MG tablet Take 1 tablet (800 mg total) by mouth 3 (three) times daily. 21 tablet 0  . metoCLOPramide (REGLAN) 10 MG tablet Take 1 tablet (10 mg total) by mouth 2 (two) times daily as needed (headache). 12 tablet 0  . oxyCODONE-acetaminophen (PERCOCET) 5-325 MG tablet Take 1 tablet by mouth every 4 (four) hours as needed. 20 tablet 0   No current facility-administered medications for this visit.     No Known Allergies  Review of Systems         Review of Systems :  [ y ] = yes, [  ] = no        General :  Weight gain [   ]    Weight loss  [   ]  Fatigue [  ]  Fever [  ]  Chills  [  ]                                Weakness  [ upper body weakness after injury ]           HEENT    Headache [  ]  Dizziness [  ]  Blurred vision [  ] Glaucoma  [  ]  Nosebleeds [  ] Painful or loose teeth [  ]        Cardiac :  Chest pain/ pressure [ point tenderness over the sternal fracture ]  Resting SOB [  ] exertional SOB [  ]                        Orthopnea [  ]  Pedal edema  [  ]  Palpitations [  ] Syncope/presyncope [ ]                         Paroxysmal nocturnal dyspnea [  ]         Pulmonary : cough [  ]  wheezing [  ]  Hemoptysis [  ] Sputum [  ] Snoring [  ]                              Pneumothorax [  ]  Sleep apnea [  ]        GI : Vomiting [  ]  Dysphagia [  ]  Melena  [  ]  Abdominal pain [  ] BRBPR [  ]              Heart burn [  ]  Constipation Mahler.Beck ] Diarrhea  [  ] Colonoscopy [   ]        GU : Hematuria [  ]  Dysuria [  ]  Nocturia [  ] UTI's [  ]        Vascular : Claudication [  ]  Rest pain [  ]  DVT [  ] Vein stripping [  ] leg ulcers [  ]                          TIA [  ] Stroke [  ]  Varicose veins [  ]        NEURO :  Headaches  [  ] Seizures [  ] Vision  changes [  ] Paresthesias [  ]                                       Seizures [  ]        Musculoskeletal :  Arthritis [  ] Gout  [  ]  Back pain [  ]  Joint pain [  ] pain of midsternum, some clicking sensation when he uses his arms to support his body        Skin :  Rash [  ]  Melanoma [  ] Sores [  ]        Heme : Bleeding problems [  ]Clotting Disorders [  ] Anemia [  ]Blood Transfusion [ ]         Endocrine : Diabetes [  ] Heat or Cold intolerance [  ] Polyuria [  ]excessive thirst [ ]         Psych : Depression [  ]  Anxiety [  ]  Psych hospitalizations [  ] Memory change [  ]  BP (!) 153/100   Pulse (!) 58   Resp 20   Ht 6\' 3"  (1.905 m)   Wt 270 lb (122.5 kg)   SpO2 98%   BMI 33.75 kg/m  Physical Exam      Physical Exam  General: Well-developed 36 year old AA male  In no distress  HEENT: Normocephalic pupils equal , dentition adequate Neck: Supple without JVD, adenopathy, or bruit Chest: Clear to auscultation, symmetrical breath sounds, no rhonchi, point tenderness  without  instability             or deformity of the sternum  Cardiovascular: Regular rate and rhythm, no murmur, no gallop, peripheral pulses             palpable in all extremities Abdomen:  Soft, nontender, no palpable mass or organomegaly Extremities: Warm, well-perfused, no clubbing cyanosis edema or tenderness,              no venous stasis changes of the legs Rectal/GU: Deferred Neuro: Grossly non--focal and symmetrical throughout Skin: Clean and dry without rash or ulceration   Diagnostic Tests: Chest CT scan images personally reviewed and discussed with patient Nondisplaced transverse midsternal fracture without fragmentation  Impression: Pain control and rest are indicated to allow healing I would not recommend sternal plating as there is no displacement or fragmentation  Plan:Refill for oxycodone 5mg -325 tablets #40 was provided. He  understands that his next prescription will be tramadol. The patient should not return to work until he is examined again on June 13 with chest x-ray. He was given a note of disability from work until his next exam. He was given strategies about how to do his activities of daily living without putting stress on his sternum. To my knowledge there is no external compression device that would be helpful to this patient. It should be completely healed in early August.  Mikey Bussing, MD [facility] [phone]

## 2016-09-03 ENCOUNTER — Ambulatory Visit (INDEPENDENT_AMBULATORY_CARE_PROVIDER_SITE_OTHER): Payer: BLUE CROSS/BLUE SHIELD | Admitting: Surgical

## 2016-09-03 ENCOUNTER — Ambulatory Visit
Admission: RE | Admit: 2016-09-03 | Discharge: 2016-09-03 | Disposition: A | Discharge status: Home / Self Care | Payer: BLUE CROSS/BLUE SHIELD | Source: Ambulatory Visit | Attending: Cardiothoracic Surgery | Admitting: Cardiothoracic Surgery

## 2016-09-03 ENCOUNTER — Encounter: Payer: BLUE CROSS/BLUE SHIELD | Admitting: Cardiothoracic Surgery

## 2016-09-03 VITALS — BP 138/90 | HR 63 | Resp 16 | Ht 75.0 in | Wt 270.0 lb

## 2016-09-03 DIAGNOSIS — S2220XD Unspecified fracture of sternum, subsequent encounter for fracture with routine healing: Secondary | ICD-10-CM | POA: Diagnosis not present

## 2016-09-03 DIAGNOSIS — S2220XK Unspecified fracture of sternum, subsequent encounter for fracture with nonunion: Secondary | ICD-10-CM

## 2016-09-03 NOTE — Patient Instructions (Signed)
F/u prn Gradual increase in activities at gym

## 2016-09-03 NOTE — Progress Notes (Signed)
[address]       [place] [id number]             [phone]                  Helane RimaSteven L [facility] Medical Record #[id number] Date of Birth: Aug 09, 1980  Referring OZ:HYQMVD:Allen, Ethelene BrownsAnthony, MD Primary Cardiology: Primary Care:Rabinowitz, Zeb ComfortJoseph H, MD  Chief Complaint:  Follow Up Visit  Follow-up sternal fracture  History of Present Illness:    The patient is a 36 year old male seen in the office on today's date in follow-up. He was recently referred from the emergency department for a sternal fracture. He was last seen on 08/19/2016 by Dr. Donata ClayVan Trigt. The fracture occurred a week previous to this visit when he was lifting weights and the bar slipped out of his left hand and fell onto his chest. CT scan showed a nondisplaced transverse fracture of the mid sternum.         Zubrod Score: At the time of surgery this patient's most appropriate activity status/level should be described as: []     0    Normal activity, no symptoms []     1    Restricted in physical strenuous activity but ambulatory, able to do out light work []     2    Ambulatory and capable of self care, unable to do work activities, up and about                 >50 % of waking hours                                                                                   []     3    Only limited self care, in bed greater than 50% of waking hours []     4    Completely disabled, no self care, confined to bed or chair []     5    Moribund  History  Smoking Status  . Former Smoker  . Packs/day: 1.00  . Types: Cigarettes  . Quit date: 04/21/2013  Smokeless Tobacco  . Never Used       No Known Allergies  Current Outpatient Prescriptions  Medication Sig Dispense Refill  . ibuprofen (ADVIL,MOTRIN) 800 MG tablet Take 1 tablet (800 mg total) by mouth 3 (three) times daily. 21 tablet 0  . metoCLOPramide (REGLAN) 10 MG tablet Take 1 tablet (10 mg total) by mouth 2 (two) times daily as needed (headache). 12  tablet 0  . oxyCODONE-acetaminophen (PERCOCET) 5-325 MG tablet Take 1 tablet by mouth every 4 (four) hours as needed. 20 tablet 0   No current facility-administered medications for this visit.        Physical Exam: Ht 6\' 3"  (1.905 m)   Wt 270 lb (122.5 kg)   BMI 33.75 kg/m   General appearance: alert, cooperative and no distress Heart: regular rate and rhythm Lungs: clear to auscultation bilaterally minor sternal discomfort to palpation , no click or movement Wounds:  Diagnostic Studies & Laboratory data:         Recent Radiology Findings: Dg Chest 2 View  Result  Date: 09/03/2016 CLINICAL DATA:  Close sternal fracture 2 weeks ago.  Sternal pain. EXAM: CHEST  2 VIEW COMPARISON:  CT scan 08/13/2016 FINDINGS: Nondisplaced sternal fracture seen on previous CT scan is not evident on today's x-ray. No evidence for retrosternal hematoma or fluid collection. The lungs are clear without focal pneumonia, edema, pneumothorax or pleural effusion. The cardiopericardial silhouette is within normal limits for size. The visualized bony structures of the thorax are intact. IMPRESSION: No acute cardiopulmonary findings. Nondisplaced sternal fracture seen on previous CT scan not evident by x-ray. Electronically Signed   By: Kennith Center M.D.   On: 09/03/2016 11:35      I have independently reviewed the above radiology findings and reviewed findings  with the patient.  Recent Labs: Lab Results  Component Value Date   WBC 4.5 01/01/2014   HGB 16.0 08/13/2016   HCT 47.0 08/13/2016   PLT 206 01/01/2014   GLUCOSE 99 08/13/2016   NA 142 08/13/2016   K 4.0 08/13/2016   CL 104 08/13/2016   CREATININE 1.10 08/13/2016   BUN 10 08/13/2016   CO2 22 01/01/2014      Assessment / Plan:  He is doing well. CXR looks fine. He can return to work- full duty F/U prn NSAIDS prn        GOLD,WAYNE E 09/03/2016 11:38 AM

## 2016-09-12 ENCOUNTER — Ambulatory Visit (INDEPENDENT_AMBULATORY_CARE_PROVIDER_SITE_OTHER): Payer: BLUE CROSS/BLUE SHIELD | Admitting: Cardiothoracic Surgery

## 2016-09-12 ENCOUNTER — Encounter: Payer: Self-pay | Admitting: Cardiothoracic Surgery

## 2016-09-12 VITALS — BP 151/91 | HR 68 | Resp 20 | Ht 75.0 in | Wt 270.0 lb

## 2016-09-12 DIAGNOSIS — S2220XD Unspecified fracture of sternum, subsequent encounter for fracture with routine healing: Secondary | ICD-10-CM | POA: Diagnosis not present

## 2016-09-12 NOTE — Progress Notes (Signed)
PCP is Gilles Chiquito, MD Referring Provider is Lorre Nick, MD  Chief Complaint  Patient presents with  . Chest Pain    f/u sternal fracture, patient does not feel like he is able to return to work full time yet    Dominic Joseph:ZDGUYQI returns for follow-up of his sternal fracture sustained approximately a month ago. He still has significant pain when he reaches out extending his arms and is unable to lift or push well without pain. He is not taking oxycodone because of constipation. 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 I feel he remains disabled from full job duties including physical pushing pulling holding restraining or lifting. I reviewed his most recent chest x-rays which shows the fracture to be healing and it remains nondisplaced.  Sternal fractures are extremely painful and it usually requires at least 2 months before normal activities and up to months before exertional upper body activities are tolerated.   Past Medical History:  Diagnosis Date  . Environmental allergies     No past surgical history on file.  Family History  Problem Relation Age of Onset  . Hypertension Mother   . Hyperlipidemia Maternal Grandmother   . Hypertension Maternal Grandmother   . Stroke Maternal Grandmother   . Stroke Maternal Grandfather     Social History Social History  Substance Use Topics  . Smoking status: Former Smoker    Packs/day: 1.00    Types: Cigarettes    Quit date: 04/21/2013  . Smokeless tobacco: Never Used  . Alcohol use No    Current Outpatient Prescriptions  Medication Sig Dispense Refill  . metoCLOPramide (REGLAN) 10 MG tablet Take 1 tablet (10 mg total) by mouth 2 (two) times daily as needed (headache). 12 tablet 0  . oxyCODONE-acetaminophen (PERCOCET) 5-325 MG tablet Take 1 tablet by mouth every 4 (four) hours as needed. 20 tablet 0  . ibuprofen (ADVIL,MOTRIN) 800 MG tablet Take 1 tablet (800 mg total) by mouth 3 (three) times daily. (Patient not taking:  Reported on 09/12/2016) 21 tablet 0   No current facility-administered medications for this visit.     No Known Allergies  Review of Systems  Patient complains of constipation from the pain medication No sternal click or motion detected  BP (!) 151/91   Pulse 68   Resp 20   Ht 6\' 3"  (1.905 m)   Wt 270 lb (122.5 kg)   SpO2 98% Comment: RA  BMI 33.75 kg/m  Physical Exam      Exam    General- alert and comfortable    Thorax and  Lungs- clear without rales, wheezes, tenderness over the mid sternum without click or movement   Cor- regular rate and rhythm, no murmur , gallop   Abdomen- soft, non-tender   Extremities - warm, non-tender, minimal edema   Neuro- oriented, appropriate, no focal weakness   Diagnostic Tests: Last chest x-ray shows no displacement of the sternum clear lung fields  Impression: Sternal fracture, now approximately one month post injury. He remains disabled from exertional upper body lifting pulling pushing restraining or rapid body movement. 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 I will see him back in 4 weeks to assess his sternal fracture status. He was given a prescription for tramadol for pain which should not cause much problems with constipation. Plan: Return in 4 weeks for review of progress and assessment and to discuss returning to full work duties.Marland Kitchen He is disabled other than desk work  until that time.   Mikey BussingPeter Van Trigt III, MD [facility] [phone]

## 2016-10-03 ENCOUNTER — Encounter: Payer: BLUE CROSS/BLUE SHIELD | Admitting: Cardiothoracic Surgery

## 2016-10-07 ENCOUNTER — Encounter: Payer: Self-pay | Admitting: Cardiothoracic Surgery

## 2016-10-07 ENCOUNTER — Ambulatory Visit (INDEPENDENT_AMBULATORY_CARE_PROVIDER_SITE_OTHER): Payer: BLUE CROSS/BLUE SHIELD | Admitting: Cardiothoracic Surgery

## 2016-10-07 VITALS — BP 145/88 | HR 65 | Resp 20 | Ht 75.0 in | Wt 268.0 lb

## 2016-10-07 DIAGNOSIS — S2220XD Unspecified fracture of sternum, subsequent encounter for fracture with routine healing: Secondary | ICD-10-CM | POA: Diagnosis not present

## 2016-10-07 NOTE — Progress Notes (Signed)
PCP is Gilles Chiquitoabinowitz, Joseph H, MD Referring Provider is Lorre NickAllen, Anthony, MD  Chief Complaint  Patient presents with  . Rib Fracture    3 week f/u, discuss RTW date    HPI: Final office visit 2 weeks after nondisplaced sternal fracture. The patient is here to discuss return to work. 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 On his last visit chest x-ray showed sternal fracture be healing. Symptomatically the patient has significantly improved since last visit. He complains now mainly of stiffness when he wakes up in the morning and minimal pain in his sternal area when using his upper extremities. He has been working at a desk job for the past month and feels ready to return to work without restriction.  On exam his sternal area is minimally tender. There is no structural abnormality or deformity. Heart rhythm is regular breath sounds are clear. Pulses are normal in the neck and upper extremities. Past Medical History:  Diagnosis Date  . Environmental allergies     No past surgical history on file.  Family History  Problem Relation Age of Onset  . Hypertension Mother   . Hyperlipidemia Maternal Grandmother   . Hypertension Maternal Grandmother   . Stroke Maternal Grandmother   . Stroke Maternal Grandfather     Social History Social History  Substance Use Topics  . Smoking status: Former Smoker    Packs/day: 1.00    Types: Cigarettes    Quit date: 04/21/2013  . Smokeless tobacco: Never Used  . Alcohol use No    Current Outpatient Prescriptions  Medication Sig Dispense Refill  . ibuprofen (ADVIL,MOTRIN) 800 MG tablet Take 1 tablet (800 mg total) by mouth 3 (three) times daily. (Patient not taking: Reported on 09/12/2016) 21 tablet 0  . metoCLOPramide (REGLAN) 10 MG tablet Take 1 tablet (10 mg total) by mouth 2 (two) times daily as needed (headache). (Patient not taking: Reported on 10/07/2016) 12 tablet 0  . oxyCODONE-acetaminophen (PERCOCET) 5-325 MG tablet Take  1 tablet by mouth every 4 (four) hours as needed. (Patient not taking: Reported on 10/07/2016) 20 tablet 0  . traMADol (ULTRAM) 50 MG tablet Take by mouth every 6 (six) hours as needed.     No current facility-administered medications for this visit.     No Known Allergies  Review of Systems   No numbness in the upper extremities No shortness of breath No productive cough  BP (!) 145/88   Pulse 65   Resp 20   Ht 6\' 3"  (1.905 m)   Wt 268 lb (121.6 kg)   SpO2 98%   BMI 33.50 kg/m  Physical Exam      Exam    General- alert and comfortable   Lungs- clear without rales, wheezes. No deformity of the sternum with minimal tenderness   Cor- regular rate and rhythm, no murmur , gallop   Abdomen- soft, non-tender   Extremities - warm, non-tender, minimal edema   Neuro- oriented, appropriate, no focal weakness   Neck- no JVD or tenderness  Diagnostic Tests: None  Impression: Healed sternal fracture, nondisplaced  Plan: Patient is ready to return to work without restriction. Return to work document provided patient starting July 18. Patient will return as needed   Mikey BussingPeter Van Trigt III, MD [facility] [phone]

## 2017-01-19 ENCOUNTER — Other Ambulatory Visit: Payer: Self-pay | Admitting: Family Medicine

## 2017-01-19 DIAGNOSIS — R079 Chest pain, unspecified: Secondary | ICD-10-CM

## 2017-01-23 ENCOUNTER — Other Ambulatory Visit: Payer: Self-pay | Admitting: Family Medicine

## 2017-01-23 DIAGNOSIS — R079 Chest pain, unspecified: Secondary | ICD-10-CM

## 2017-01-28 ENCOUNTER — Ambulatory Visit
Admission: RE | Admit: 2017-01-28 | Discharge: 2017-01-28 | Disposition: A | Discharge status: Home / Self Care | Payer: BLUE CROSS/BLUE SHIELD | Source: Ambulatory Visit | Attending: Family Medicine | Admitting: Family Medicine

## 2017-01-28 DIAGNOSIS — R079 Chest pain, unspecified: Secondary | ICD-10-CM | POA: Insufficient documentation

## 2017-01-28 LAB — EXERCISE TOLERANCE TEST
CHL CUP MPHR: 184 {beats}/min
CSEPEW: 13 METS
Exercise duration (min): 12 min
Exercise duration (sec): 19 s
Peak HR: 156 {beats}/min
Percent HR: 85 %
Rest HR: 60 {beats}/min

## 2018-02-15 ENCOUNTER — Ambulatory Visit (INDEPENDENT_AMBULATORY_CARE_PROVIDER_SITE_OTHER): Payer: BLUE CROSS/BLUE SHIELD

## 2018-02-15 ENCOUNTER — Ambulatory Visit (HOSPITAL_COMMUNITY)
Admission: EM | Admit: 2018-02-15 | Discharge: 2018-02-15 | Disposition: A | Discharge status: Home / Self Care | Payer: BLUE CROSS/BLUE SHIELD | Attending: Family Medicine | Admitting: Family Medicine

## 2018-02-15 ENCOUNTER — Encounter (HOSPITAL_COMMUNITY): Payer: Self-pay

## 2018-02-15 ENCOUNTER — Other Ambulatory Visit: Payer: Self-pay

## 2018-02-15 DIAGNOSIS — R51 Headache: Secondary | ICD-10-CM | POA: Diagnosis not present

## 2018-02-15 DIAGNOSIS — S39012A Strain of muscle, fascia and tendon of lower back, initial encounter: Secondary | ICD-10-CM

## 2018-02-15 DIAGNOSIS — S161XXA Strain of muscle, fascia and tendon at neck level, initial encounter: Secondary | ICD-10-CM

## 2018-02-15 MED ORDER — KETOROLAC TROMETHAMINE 60 MG/2ML IM SOLN
60.0000 mg | Freq: Once | INTRAMUSCULAR | Status: AC
  Administered 2018-02-15: 60 mg via INTRAMUSCULAR

## 2018-02-15 MED ORDER — MELOXICAM 15 MG PO TABS: 15.0000 mg | ORAL_TABLET | Freq: Every day | ORAL | 0 refills | Status: DC

## 2018-02-15 MED ORDER — CYCLOBENZAPRINE HCL 10 MG PO TABS: 10.0000 mg | ORAL_TABLET | Freq: Two times a day (BID) | ORAL | 0 refills | Status: DC | PRN

## 2018-02-15 MED ORDER — KETOROLAC TROMETHAMINE 60 MG/2ML IM SOLN
INTRAMUSCULAR | Status: AC
  Filled 2018-02-15: qty 2

## 2018-02-15 NOTE — Discharge Instructions (Addendum)
Your x-rays were negative for any bony abnormalities. We will do muscle relaxants and meloxicam for pain inflammation Toradol injection given here in clinic For continued or worsening symptoms please follow-up with your primary care provider
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
## 2018-06-10 IMAGING — CR DG CHEST 2V
2 series · 2 of 2 positions shown · non-contrast
Comparison: 03/02/2015

CLINICAL DATA: Sternal chest pain and shortness of breath this
morning.

EXAM:
CHEST  2 VIEW

[chest lat]
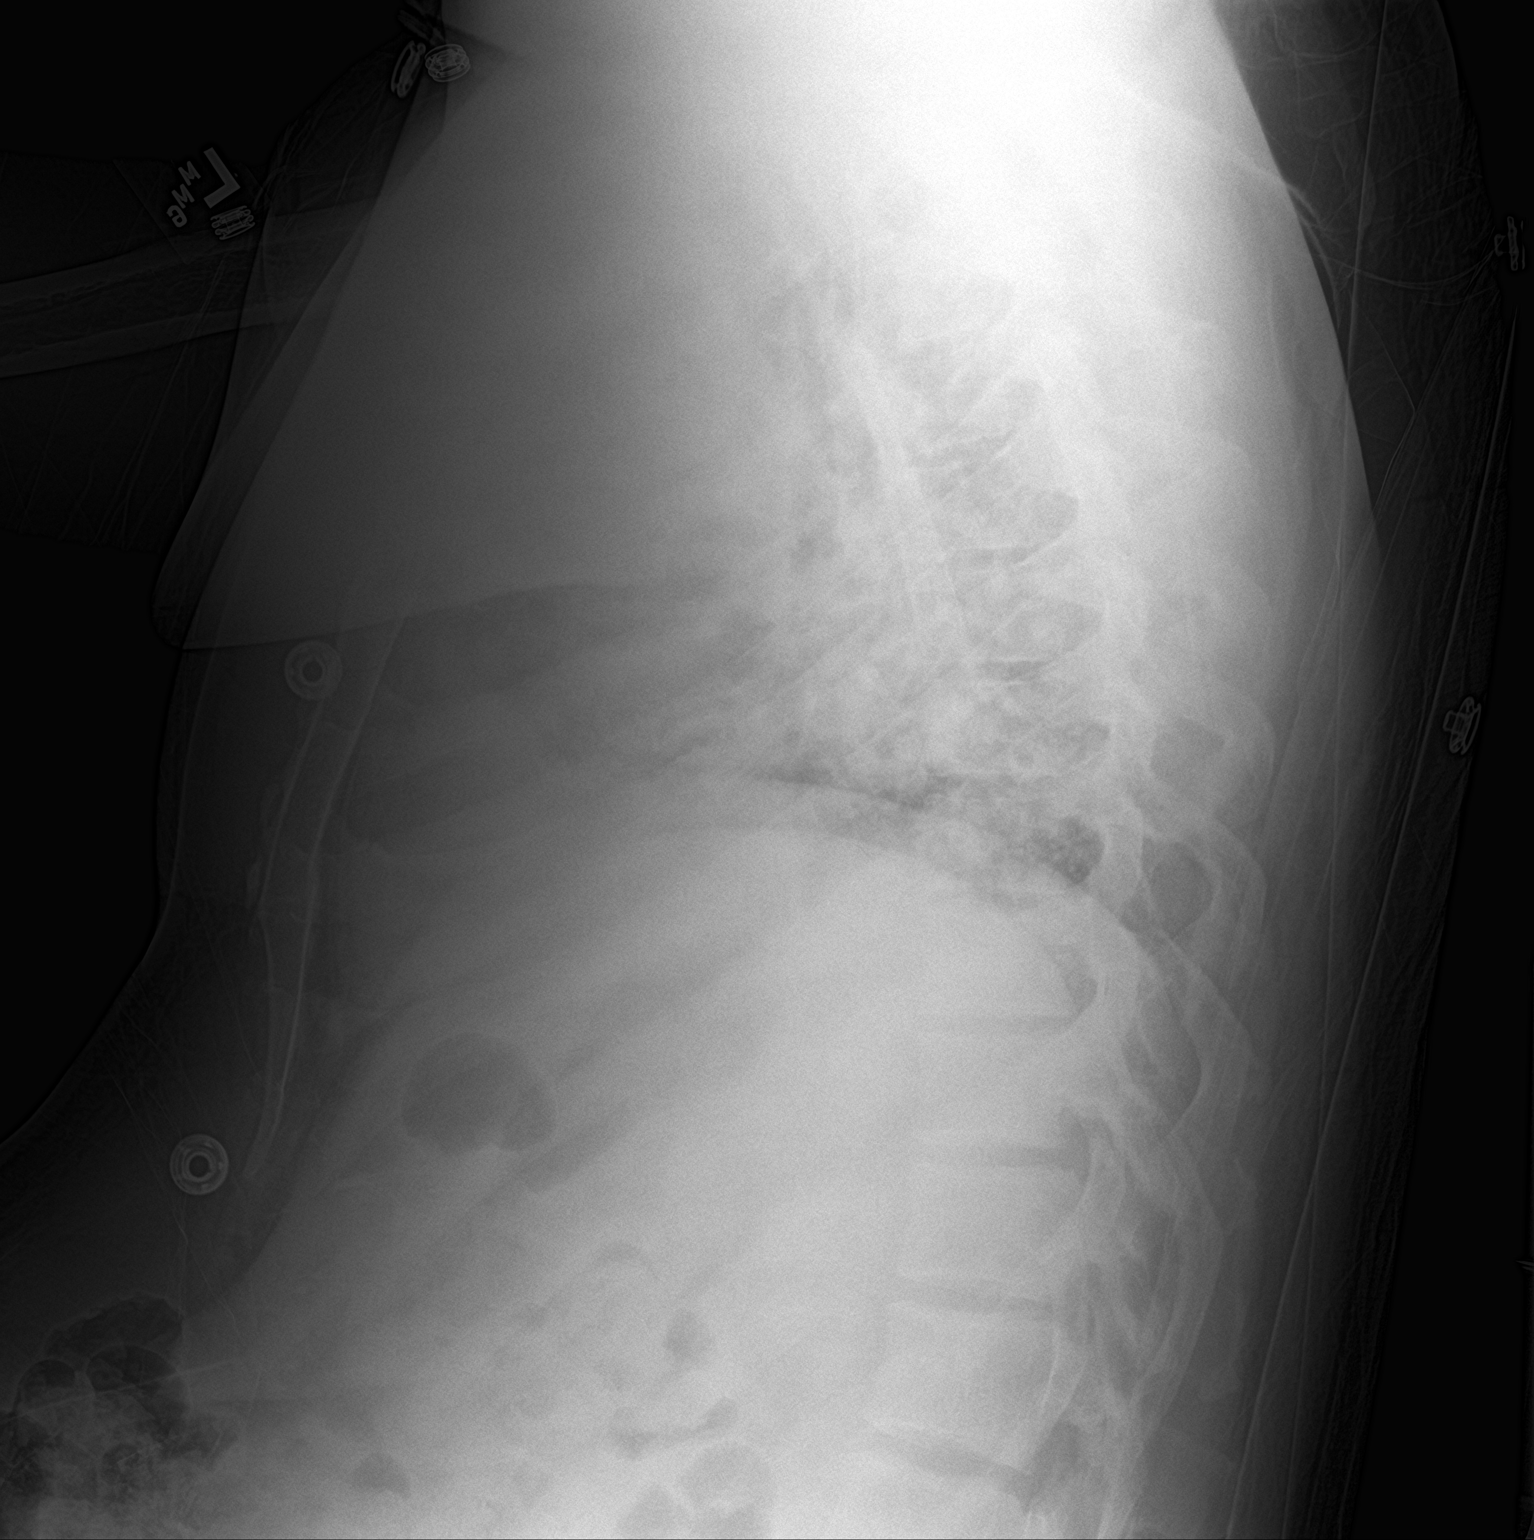

[chest ap]
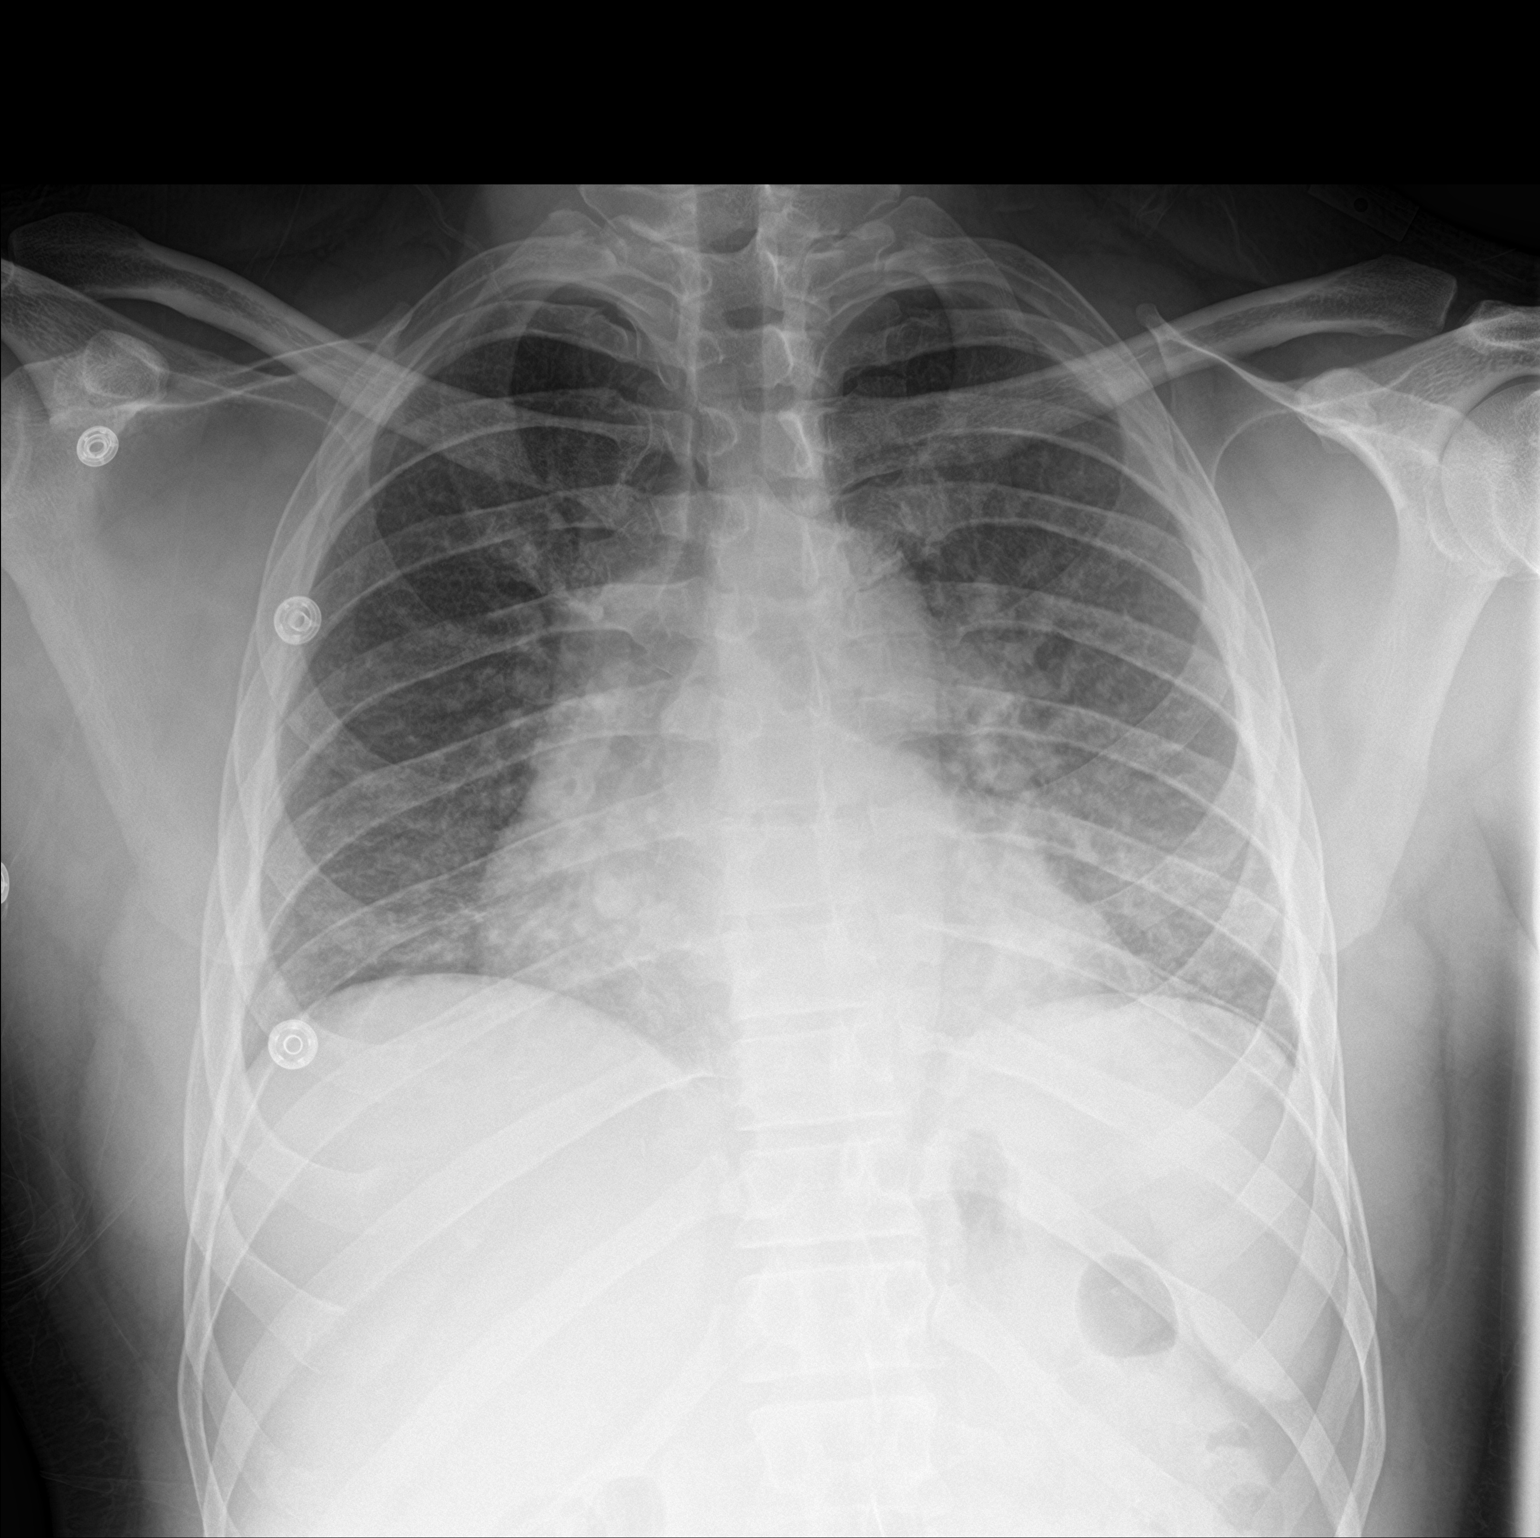

[2 of 2 positions shown; findings below may reference images not displayed]

FINDINGS: Shallow inspiration. Cardiac enlargement without vascular
congestion. Probable atelectasis in the lung bases. No focal
consolidation. No blunting of costophrenic angles. No pneumothorax.
Mediastinal contours appear intact.
IMPRESSION: Shallow inspiration. Cardiac enlargement. Atelectasis in the lung
bases. No focal consolidation.

## 2018-06-10 IMAGING — CT CT CHEST W/ CM
2 of 4 series · 15 of 36 positions shown, 18 images · IV contrast (isovue)
Comparison: None.

CLINICAL DATA: Bar fell onto chest this morning while lifting
weights. Shortness of breath when moving. Initial encounter.

EXAM:
CT CHEST WITH CONTRAST
TECHNIQUE: Multidetector CT imaging of the chest was performed during
intravenous contrast administration.
CONTRAST:  75 cc Isovue 300 intravenous

[Series 3: thorax 2.0 i31f 2 · axial · 0.78mm/px · z∈[+1254,+1494]mm · 12 of 141 slices shown, 15 images]
[im 11/141  mediastinal]
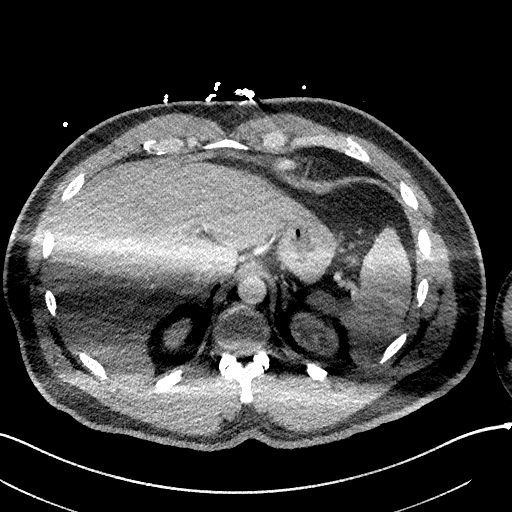
[im 11/141  lung]
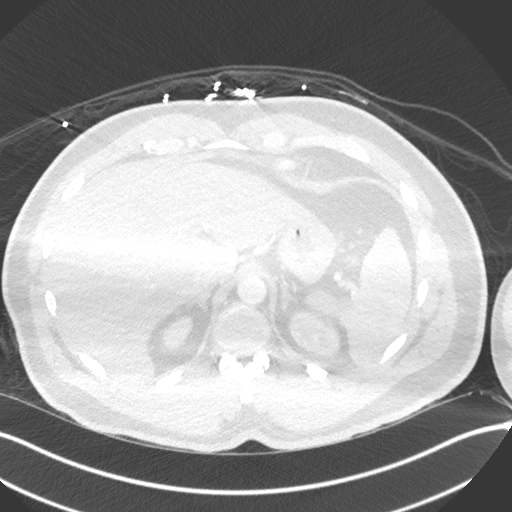
[im 21/141  lung]
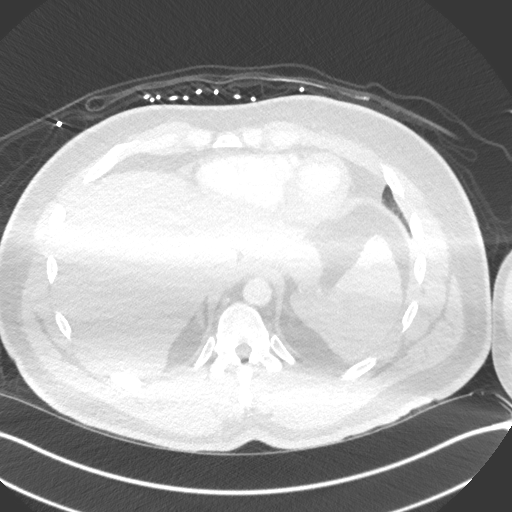
[im 31/141  lung]
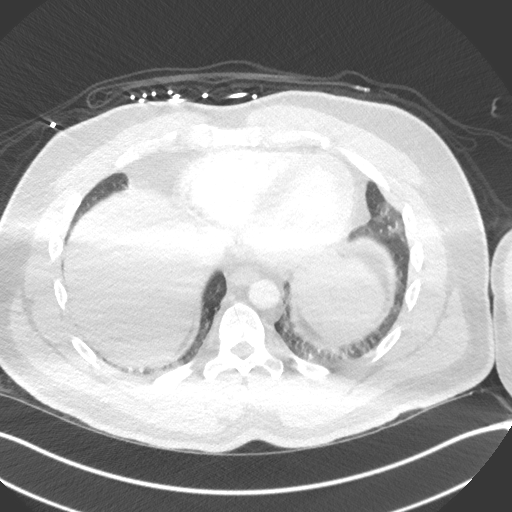
[im 41/141  lung]
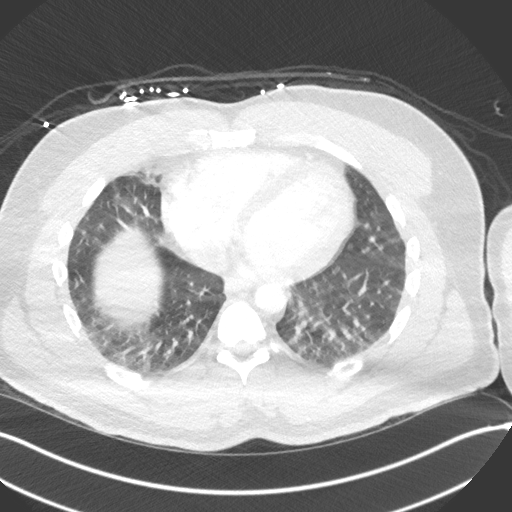
[im 51/141  mediastinal]
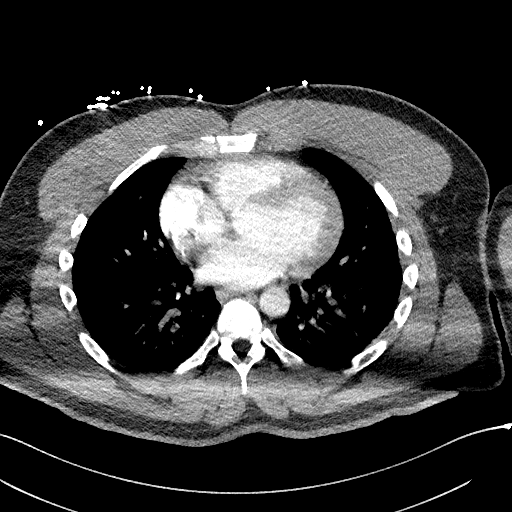
[im 51/141  lung]
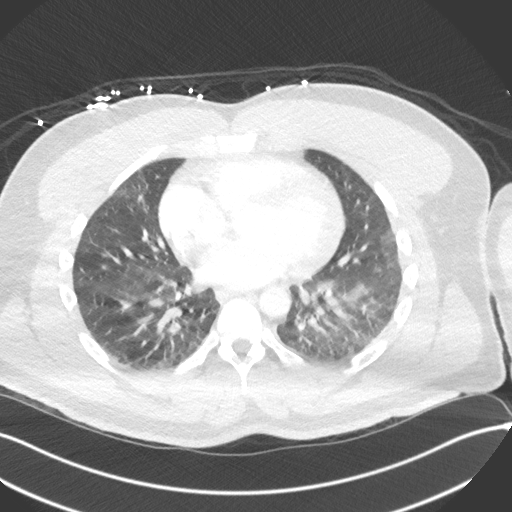
[im 61/141  lung]
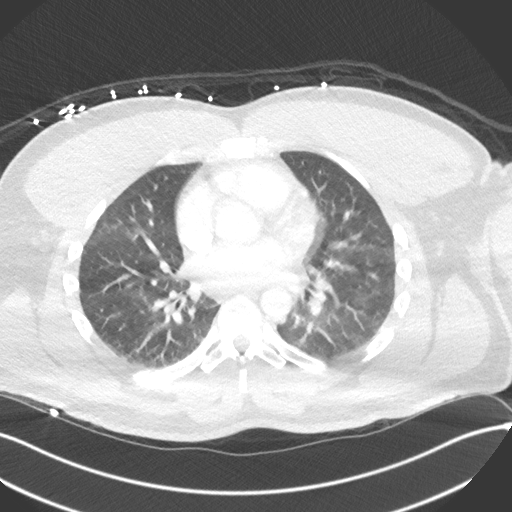
[im 81/141  lung]
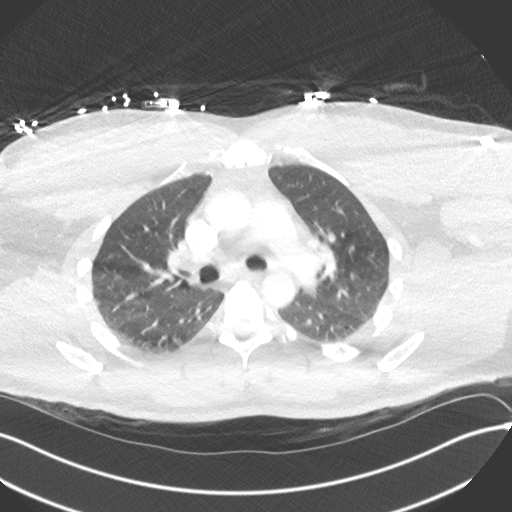
[im 91/141  lung]
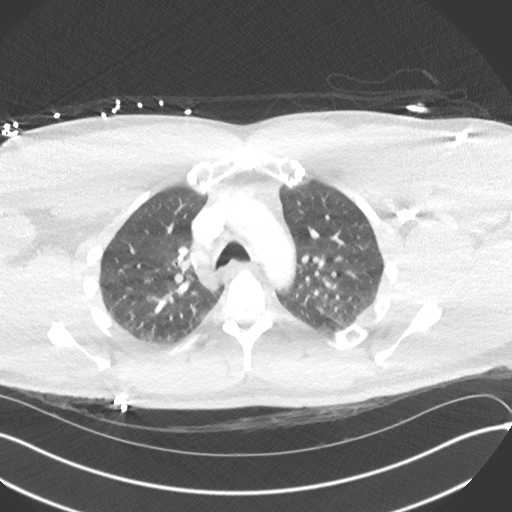
[im 101/141  mediastinal]
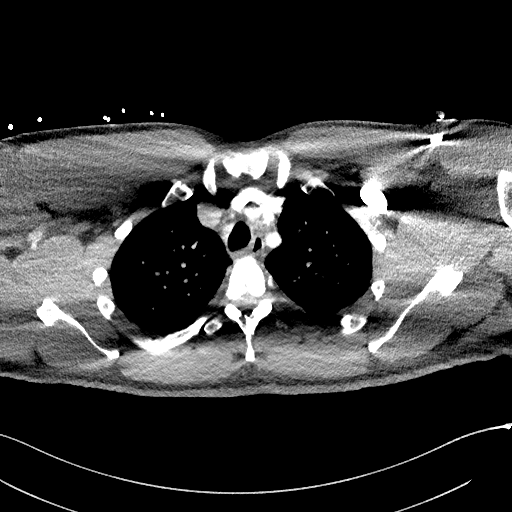
[im 101/141  lung]
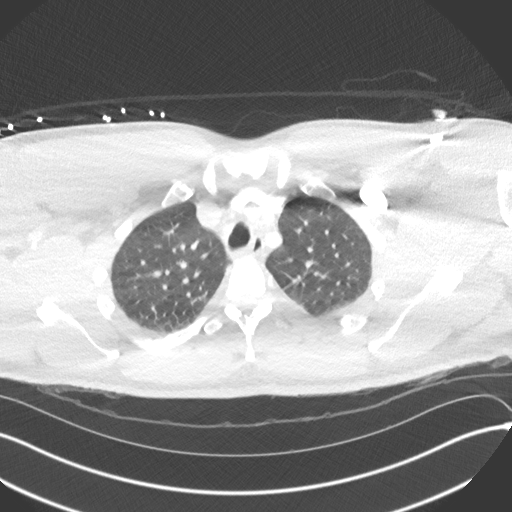
[im 111/141  lung]
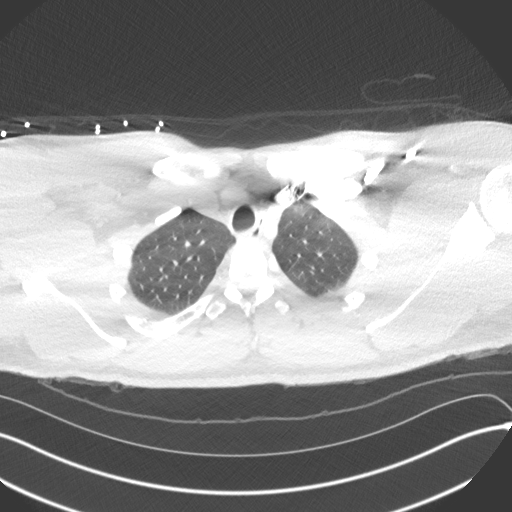
[im 121/141  lung]
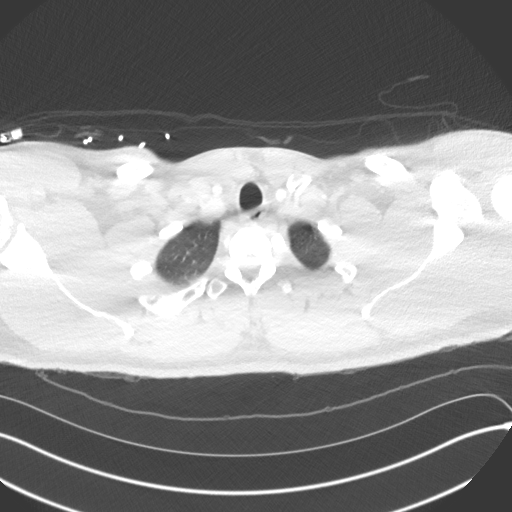
[im 131/141  lung]
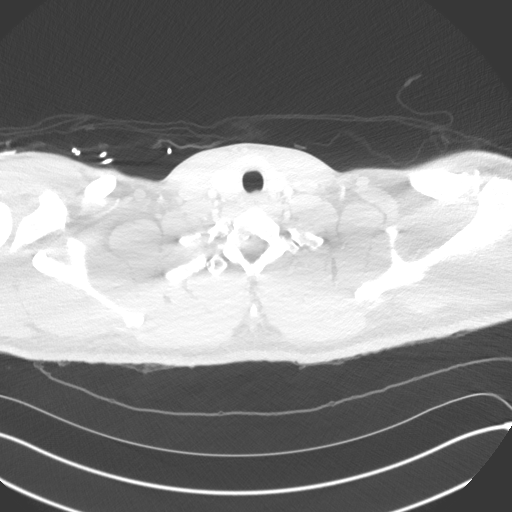

[Series 5: coronal · coronal · 0.58mm/px · 3 of 151 slices shown]
[im 31/151  lung]
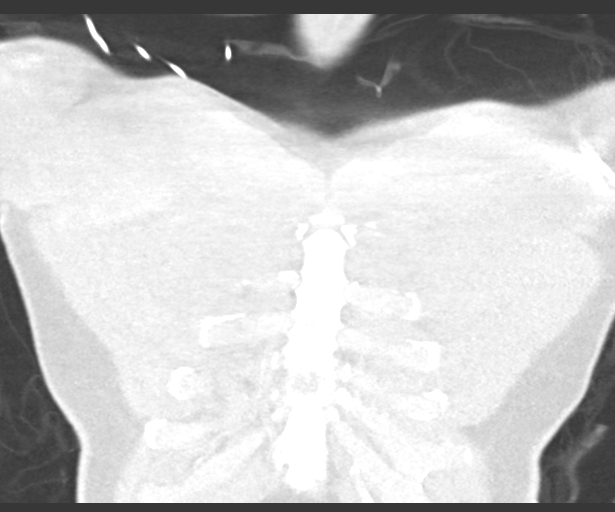
[im 61/151  lung]
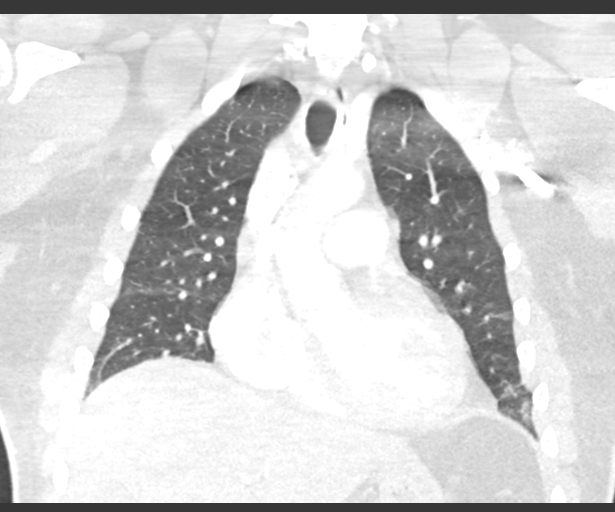
[im 91/151  lung]
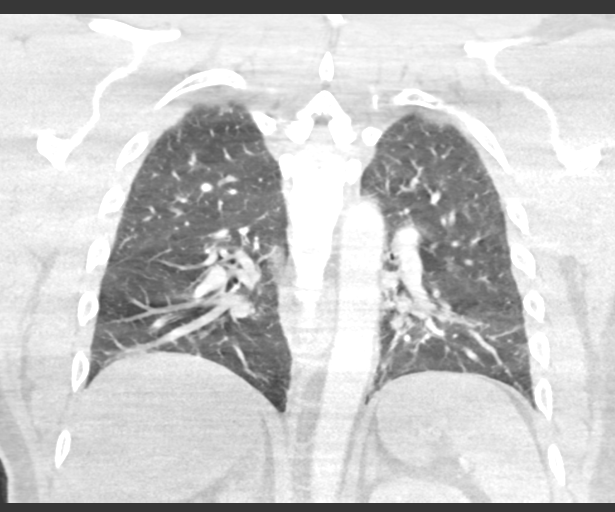

[15 of 36 positions shown; findings below may reference images not displayed]

FINDINGS: Cardiovascular: Normal heart size. No pericardial effusion. No
evidence of great vessel injury.

Mediastinum/Nodes: Negative for hematoma or pneumomediastinum.

Lungs/Pleura: Low volume chest with atelectasis. No hemothorax,
pneumothorax, or lung contusion.

Upper Abdomen: No evidence of injury.

Musculoskeletal: Nondisplaced mid sternal body fracture. No noted
rib fracture. No detectable muscular injury.
IMPRESSION: 1. Nondisplaced sternal body fracture.
2. No evidence of intrathoracic injury.
3. Low volume chest with subsegmental atelectasis.

## 2018-11-30 DIAGNOSIS — J3089 Other allergic rhinitis: Secondary | ICD-10-CM | POA: Diagnosis not present

## 2018-11-30 DIAGNOSIS — J324 Chronic pansinusitis: Secondary | ICD-10-CM | POA: Diagnosis not present

## 2018-12-24 ENCOUNTER — Ambulatory Visit: Payer: Self-pay

## 2018-12-24 DIAGNOSIS — Z23 Encounter for immunization: Secondary | ICD-10-CM

## 2018-12-25 DIAGNOSIS — Z1159 Encounter for screening for other viral diseases: Secondary | ICD-10-CM | POA: Diagnosis not present

## 2019-01-04 DIAGNOSIS — Z20828 Contact with and (suspected) exposure to other viral communicable diseases: Secondary | ICD-10-CM | POA: Diagnosis not present

## 2019-03-12 DIAGNOSIS — Z20828 Contact with and (suspected) exposure to other viral communicable diseases: Secondary | ICD-10-CM | POA: Diagnosis not present

## 2019-06-04 ENCOUNTER — Ambulatory Visit (HOSPITAL_COMMUNITY)
Admission: EM | Admit: 2019-06-04 | Discharge: 2019-06-04 | Disposition: A | Discharge status: Home / Self Care | Payer: 59 | Attending: Emergency Medicine | Admitting: Emergency Medicine

## 2019-06-04 ENCOUNTER — Ambulatory Visit (INDEPENDENT_AMBULATORY_CARE_PROVIDER_SITE_OTHER): Payer: 59

## 2019-06-04 ENCOUNTER — Encounter (HOSPITAL_COMMUNITY): Payer: Self-pay | Admitting: *Deleted

## 2019-06-04 ENCOUNTER — Other Ambulatory Visit: Payer: Self-pay

## 2019-06-04 DIAGNOSIS — M7989 Other specified soft tissue disorders: Secondary | ICD-10-CM | POA: Diagnosis not present

## 2019-06-04 DIAGNOSIS — M25562 Pain in left knee: Secondary | ICD-10-CM | POA: Diagnosis not present

## 2019-06-04 MED ORDER — ACETAMINOPHEN 325 MG PO TABS
ORAL_TABLET | ORAL | Status: AC
  Filled 2019-06-04: qty 3

## 2019-06-04 MED ORDER — METHYLPREDNISOLONE 4 MG PO TBPK: ORAL_TABLET | Freq: Every day | ORAL | 0 refills | Status: DC

## 2019-06-04 MED ORDER — KETOROLAC TROMETHAMINE 30 MG/ML IJ SOLN
INTRAMUSCULAR | Status: AC
  Filled 2019-06-04: qty 1

## 2019-06-04 MED ORDER — IBUPROFEN 600 MG PO TABS: 600.0000 mg | ORAL_TABLET | Freq: Four times a day (QID) | ORAL | 0 refills | Status: DC | PRN

## 2019-06-04 MED ORDER — HYDROCODONE-ACETAMINOPHEN 5-325 MG PO TABS: 1.0000 | ORAL_TABLET | Freq: Four times a day (QID) | ORAL | 0 refills | Status: DC | PRN

## 2019-06-04 MED ORDER — KETOROLAC TROMETHAMINE 30 MG/ML IJ SOLN
30.0000 mg | Freq: Once | INTRAMUSCULAR | Status: AC
  Administered 2019-06-04: 30 mg via INTRAMUSCULAR

## 2019-06-04 MED ORDER — ACETAMINOPHEN 325 MG PO TABS
975.0000 mg | ORAL_TABLET | Freq: Once | ORAL | Status: AC
  Administered 2019-06-04: 975 mg via ORAL

## 2019-06-04 NOTE — Discharge Instructions (Addendum)
Your x-ray was normal.  There is no evidence of fracture, foreign body, dislocation, or fluid in your knee.  Wear the knee immobilizer as needed for comfort.  Make sure you take your knee out moving around several times a day. ibuprofen 600 mg of the Tylenol containing product 3 or 4 times a day as needed for pain, Tylenol/ibuprofen for mild to moderate pain, Norco/ibuprofen for severe pain.  Try the Medrol dose pack.  Ice your knee for 20 minutes at a time.  Follow-up with sports medicine or orthopedics as soon as you possibly can.  Your next steps will be an ultrasound, CT or MRI of your knee.

## 2019-06-04 NOTE — ED Provider Notes (Signed)
HPI  SUBJECTIVE:  Dominic Joseph is a 39 y.o. male who presents with anterior left knee pain described as constant, sharp, located behind his kneecap.  States it woke him up in the middle of the night.  States it feels like "bone is rubbing against bone".  States yesterday was a leg day at the gym and he was able to work out without any problem.  He does really heavy weights with high repetitions.  He reports states that his knee feels unstable, reports popping and clicking, swelling and some numbness in his toes.  No trauma, increasing weights while weightlifting, increasing reps.  No hip or ankle pain.  No erythema fevers.  His knee has not given way.  He has tried Tylenol 1600 mg once and BC powder.  Symptoms are better with fully extending his leg, worse with weightbearing, all movement, and applying torque to it.  He has a past medical history of left ACL tear which was treated with physical therapy.  He states this feels similar to that.  He has a history of hypertension.  No history of gout, diabetes.  PMD: Dr. Madilyn Hook.  Orthopedics: None.    Past Medical History:  Diagnosis Date  . Environmental allergies     Past Surgical History:  Procedure Laterality Date  . SHOULDER SURGERY      Family History  Problem Relation Age of Onset  . Hypertension Mother   . Hyperlipidemia Maternal Grandmother   . Hypertension Maternal Grandmother   . Stroke Maternal Grandmother   . Stroke Maternal Grandfather     Social History   Tobacco Use  . Smoking status: Former Smoker    Packs/day: 1.00    Types: Cigarettes    Quit date: 04/21/2013    Years since quitting: 6.1  . Smokeless tobacco: Never Used  Substance Use Topics  . Alcohol use: No  . Drug use: Yes    Types: Marijuana    Comment: in high school    No current facility-administered medications for this encounter.  Current Outpatient Medications:  .  AMLODIPINE BENZOATE PO, Take by mouth., Disp: , Rfl:  .  Fexofenadine HCl (ALLEGRA  PO), Take by mouth., Disp: , Rfl:  .  Multiple Vitamin (MULTIVITAMIN) capsule, Take 1 capsule by mouth daily., Disp: , Rfl:  .  Triamcinolone Acetonide (NASACORT AQ NA), Place into the nose., Disp: , Rfl:  .  Vortioxetine HBr (TRINTELLIX PO), Take by mouth., Disp: , Rfl:  .  HYDROcodone-acetaminophen (NORCO/VICODIN) 5-325 MG tablet, Take 1-2 tablets by mouth every 6 (six) hours as needed for moderate pain or severe pain., Disp: 12 tablet, Rfl: 0 .  ibuprofen (ADVIL) 600 MG tablet, Take 1 tablet (600 mg total) by mouth every 6 (six) hours as needed., Disp: 30 tablet, Rfl: 0 .  methylPREDNISolone (MEDROL DOSEPAK) 4 MG TBPK tablet, Take by mouth daily. Follow package instructions, Disp: 21 tablet, Rfl: 0  No Known Allergies   ROS  As noted in HPI.   Physical Exam  BP (!) 151/92 (BP Location: Left Arm) Comment: reported BP to Nurse Christina Soto  Pulse 61   Temp 99.1 F (37.3 C) (Oral)   SpO2 100%   Constitutional: Well developed, well nourished, no acute distress Eyes:  EOMI, conjunctiva normal bilaterally HENT: Normocephalic, atraumatic,mucus membranes moist Respiratory: Normal inspiratory effort Cardiovascular: Normal rate GI: nondistended skin: No rash, skin intact Musculoskeletal: Knees symmetric.  left knee ROM  decreased due to pain, Flexion  intact, Patella NT, Patellar tendon  Tender, Medial joint tender, Lateral joint  tender, Popliteal region NT, Varus MCL stress testing stable but painful, Valgus LCL stress testing stable, McMurray's testing abnormal, Lachman's negative. Distal NVI with intact baseline sensation / motor / pulse distal to knee.  No effusion. No erythema. No increased temperature.  Positive crepitus.  Neurologic: Alert & oriented x 3, no focal neuro deficits Psychiatric: Speech and behavior appropriate   ED Course   Medications  acetaminophen (TYLENOL) tablet 975 mg (975 mg Oral Given 06/04/19 1326)  ketorolac (TORADOL) 30 MG/ML injection 30 mg (30 mg  Intramuscular Given 06/04/19 1324)    Orders Placed This Encounter  Procedures  . DG Knee Complete 4 Views Left    Standing Status:   Standing    Number of Occurrences:   1    Order Specific Question:   Reason for Exam (SYMPTOM  OR DIAGNOSIS REQUIRED)    Answer:   Anterior, medial, lateral knee pain rule out effusion, fracture  . Apply knee immobilizer    Standing Status:   Standing    Number of Occurrences:   1    Order Specific Question:   Laterality    Answer:   Left    No results found for this or any previous visit (from the past 24 hour(s)). DG Knee Complete 4 Views Left  Result Date: 06/04/2019 CLINICAL DATA:  Knee pain and swelling after working out EXAM: LEFT KNEE - COMPLETE 4+ VIEW COMPARISON:  None. FINDINGS: No evidence of fracture, dislocation, or joint effusion. No evidence of arthropathy or other focal bone abnormality. Soft tissues are unremarkable. IMPRESSION: No fracture or dislocation of the left knee. Joint spaces are preserved. No knee joint effusion. Electronically Signed   By: Eddie Candle M.D.   On: 06/04/2019 14:01    ED Clinical Impression  1. Acute pain of left knee      Suspect ligamentous or meniscal injury.    will get left knee x-ray to rule out effusion, avulsion fracture.  He has tenderness over the patellar tendon in addition to the medial lateral joint.  Wonder if he has injured his patellar tendon.  He has pain with stressing the meniscus and medial collateral ligaments as well.  His ACL PCL are stable.  There is no appreciable effusion.  No evidence of infection, gout.    Will give Toradol 30 mg IM and Tylenol 975 mg p.o. here as patient appears uncomfortable.  If x-ray is normal, plan to send home with a knee immobilizer, ibuprofen 600 mg of the Tylenol containing product 3 or 4 times a day as needed for pain, Tylenol/ibuprofen for mild to moderate pain, Norco/ibuprofen for severe pain.   Discussed with patient do not take both Tylenol and Norco at  the same time.  Medrol Dosepak. He is to follow-up with orthopedics, Dr. Erlinda Hong on call, or sports medicine ASAP for further evaluation and management.  Will give him several different options to choose from.  [place] Narcotic database reviewed for this patient, and feel that the risk/benefit ratio today is favorable for proceeding with a prescription for controlled substance.  No opiate prescriptions in 2 years.  Reviewed imaging independently.  Normal knee.  No effusion.  See radiology report for full details.  Plan as above.  Discussed imaging, MDM, treatment plan, and plan for follow-up with patient.  patient agrees with plan.   Meds ordered this encounter  Medications  . acetaminophen (TYLENOL) tablet 975 mg  . ketorolac (TORADOL) 30 MG/ML injection 30  mg  . HYDROcodone-acetaminophen (NORCO/VICODIN) 5-325 MG tablet    Sig: Take 1-2 tablets by mouth every 6 (six) hours as needed for moderate pain or severe pain.    Dispense:  12 tablet    Refill:  0  . ibuprofen (ADVIL) 600 MG tablet    Sig: Take 1 tablet (600 mg total) by mouth every 6 (six) hours as needed.    Dispense:  30 tablet    Refill:  0  . methylPREDNISolone (MEDROL DOSEPAK) 4 MG TBPK tablet    Sig: Take by mouth daily. Follow package instructions    Dispense:  21 tablet    Refill:  0    *This clinic note was created using Dragon dictation software. Therefore, there may be occasional mistakes despite careful proofreading.   ?   Domenick Gong, MD 06/06/19 [phone]

## 2019-06-04 NOTE — ED Triage Notes (Signed)
Pt reports going to gym yesterday without any problems.  States woke up this AM with constant left knee pain.  States initially had some numbness in left toes, which has resolved.  Pain significantly worse when trying to ambulate - "I can't bear weight at all".

## 2019-06-06 DIAGNOSIS — M25562 Pain in left knee: Secondary | ICD-10-CM | POA: Diagnosis not present

## 2019-06-16 DIAGNOSIS — M25562 Pain in left knee: Secondary | ICD-10-CM | POA: Diagnosis not present

## 2019-06-20 DIAGNOSIS — M25562 Pain in left knee: Secondary | ICD-10-CM | POA: Diagnosis not present

## 2019-06-29 NOTE — Progress Notes (Signed)
Scheduled to complete physical 07/07/19 with Durward Parcel, PA-C.  AMD

## 2019-06-30 ENCOUNTER — Ambulatory Visit: Payer: Self-pay

## 2019-06-30 ENCOUNTER — Other Ambulatory Visit: Payer: Self-pay

## 2019-06-30 DIAGNOSIS — Z0000 Encounter for general adult medical examination without abnormal findings: Secondary | ICD-10-CM

## 2019-06-30 LAB — POCT URINALYSIS DIPSTICK
Bilirubin, UA: NEGATIVE
Blood, UA: NEGATIVE
Glucose, UA: NEGATIVE
Ketones, UA: NEGATIVE
Leukocytes, UA: NEGATIVE
Nitrite, UA: NEGATIVE
Protein, UA: NEGATIVE
Spec Grav, UA: 1.015 (ref 1.010–1.025)
Urobilinogen, UA: 0.2 E.U./dL
pH, UA: 6.5 (ref 5.0–8.0)

## 2019-07-01 LAB — CMP12+LP+TP+TSH+6AC+PSA+CBC…
ALT: 41 IU/L (ref 0–44)
Albumin/Globulin Ratio: 2.1 (ref 1.2–2.2)
Albumin: 4.7 g/dL (ref 4.0–5.0)
Alkaline Phosphatase: 51 IU/L (ref 39–117)
BUN/Creatinine Ratio: 9 (ref 9–20)
BUN: 12 mg/dL (ref 6–20)
Basophils Absolute: 0 10*3/uL (ref 0.0–0.2)
Bilirubin Total: 0.5 mg/dL (ref 0.0–1.2)
Calcium: 9.4 mg/dL (ref 8.7–10.2)
Chloride: 106 mmol/L (ref 96–106)
Chol/HDL Ratio: 3.5 ratio (ref 0.0–5.0)
Cholesterol, Total: 187 mg/dL (ref 100–199)
Creatinine, Ser: 1.33 mg/dL — ABNORMAL HIGH (ref 0.76–1.27)
Estimated CHD Risk: 0.6 times avg. (ref 0.0–1.0)
GFR calc non Af Amer: 67 mL/min/{1.73_m2} (ref >59)
GGT: 40 IU/L (ref 0–65)
Globulin, Total: 2.2 g/dL (ref 1.5–4.5)
Immature Grans (Abs): 0 10*3/uL (ref 0.0–0.1)
Immature Granulocytes: 1 %
Iron: 128 ug/dL (ref 38–169)
LDH: 265 IU/L — ABNORMAL HIGH (ref 121–224)
Lymphocytes Absolute: 1.1 10*3/uL (ref 0.7–3.1)
MCV: 79 fL (ref 79–97)
Monocytes: 11 %
Neutrophils Absolute: 2.2 10*3/uL (ref 1.4–7.0)
Neutrophils: 55 %
Potassium: 4.3 mmol/L (ref 3.5–5.2)
Prostate Specific Ag, Serum: 0.4 ng/mL (ref 0.0–4.0)
Sodium: 145 mmol/L — ABNORMAL HIGH (ref 134–144)
T3 Uptake Ratio: 25 % (ref 24–39)
TSH: 0.906 u[IU]/mL (ref 0.450–4.500)

## 2019-07-01 LAB — CMP12+LP+TP+TSH+6AC+PSA+CBC?
AST: 33 IU/L (ref 0–40)
Basos: 1 %
EOS (ABSOLUTE): 0.2 10*3/uL (ref 0.0–0.4)
Eos: 5 %
Free Thyroxine Index: 1.9 (ref 1.2–4.9)
GFR calc Af Amer: 77 mL/min/{1.73_m2} (ref >59)
Glucose: 93 mg/dL (ref 65–99)
HDL: 53 mg/dL (ref >39)
Hematocrit: 49 % (ref 37.5–51.0)
Hemoglobin: 16.4 g/dL (ref 13.0–17.7)
LDL Chol Calc (NIH): 121 mg/dL — ABNORMAL HIGH (ref 0–99)
Lymphs: 27 %
MCH: 26.6 pg (ref 26.6–33.0)
MCHC: 33.5 g/dL (ref 31.5–35.7)
Monocytes Absolute: 0.4 10*3/uL (ref 0.1–0.9)
Phosphorus: 3.6 mg/dL (ref 2.8–4.1)
Platelets: 255 10*3/uL (ref 150–450)
RBC: 6.17 x10E6/uL — ABNORMAL HIGH (ref 4.14–5.80)
RDW: 14.7 % (ref 11.6–15.4)
T4, Total: 7.6 ug/dL (ref 4.5–12.0)
Total Protein: 6.9 g/dL (ref 6.0–8.5)
Triglycerides: 71 mg/dL (ref 0–149)
Uric Acid: 7.4 mg/dL (ref 3.8–8.4)
VLDL Cholesterol Cal: 13 mg/dL (ref 5–40)
WBC: 3.9 10*3/uL (ref 3.4–10.8)

## 2019-07-07 ENCOUNTER — Ambulatory Visit: Payer: Self-pay | Admitting: Physician Assistant

## 2019-07-07 ENCOUNTER — Other Ambulatory Visit: Payer: Self-pay

## 2019-07-07 ENCOUNTER — Encounter: Payer: Self-pay | Admitting: Physician Assistant

## 2019-07-07 VITALS — BP 144/94 | HR 62 | Temp 98.1°F | Resp 16 | Ht 75.0 in | Wt 281.0 lb

## 2019-07-07 DIAGNOSIS — F32A Depression, unspecified: Secondary | ICD-10-CM | POA: Insufficient documentation

## 2019-07-07 DIAGNOSIS — R5383 Other fatigue: Secondary | ICD-10-CM | POA: Insufficient documentation

## 2019-07-07 DIAGNOSIS — G44001 Cluster headache syndrome, unspecified, intractable: Secondary | ICD-10-CM

## 2019-07-07 DIAGNOSIS — K589 Irritable bowel syndrome without diarrhea: Secondary | ICD-10-CM | POA: Insufficient documentation

## 2019-07-07 DIAGNOSIS — F988 Other specified behavioral and emotional disorders with onset usually occurring in childhood and adolescence: Secondary | ICD-10-CM | POA: Insufficient documentation

## 2019-07-07 DIAGNOSIS — R7989 Other specified abnormal findings of blood chemistry: Secondary | ICD-10-CM

## 2019-07-07 DIAGNOSIS — G44009 Cluster headache syndrome, unspecified, not intractable: Secondary | ICD-10-CM | POA: Insufficient documentation

## 2019-07-07 DIAGNOSIS — F322 Major depressive disorder, single episode, severe without psychotic features: Secondary | ICD-10-CM | POA: Insufficient documentation

## 2019-07-07 DIAGNOSIS — Z0000 Encounter for general adult medical examination without abnormal findings: Secondary | ICD-10-CM

## 2019-07-07 DIAGNOSIS — I10 Essential (primary) hypertension: Secondary | ICD-10-CM | POA: Insufficient documentation

## 2019-07-07 DIAGNOSIS — F329 Major depressive disorder, single episode, unspecified: Secondary | ICD-10-CM | POA: Insufficient documentation

## 2019-07-07 NOTE — Progress Notes (Signed)
EKG completed today.  AMD 

## 2019-07-07 NOTE — Progress Notes (Signed)
   Subjective: Physical exam    Patient ID: Dominic Joseph, male    DOB: October 10, 1980, 39 y.o.   MRN: [id number]  HPI Patient presents for physical exam voices no concerns at this time.   Review of Systems    Negative Objective:   Physical Exam No acute distress.  HEENT is unremarkable.  Neck is supple for adenopathy or bruits.  Lungs are clear to auscultation.  Heart has bradycardic rate and rhythm.  Abdomen negative chest pain, normoactive bowel sounds, soft nontender palpation.  No obvious deformity to the upper or lower extremities.  Patient is full neck range of motion upper lower extremities.  No obvious deformity to cervical lumbar spine.  Patient is full equal range of motion cervical lumbar spine.  Cranial nerves II through XII grossly intact.       Assessment & Plan: Well exam  No acute follow-up exam.  Follow-up as needed.

## 2019-07-12 DIAGNOSIS — M6281 Muscle weakness (generalized): Secondary | ICD-10-CM | POA: Diagnosis not present

## 2019-07-12 DIAGNOSIS — M25562 Pain in left knee: Secondary | ICD-10-CM | POA: Diagnosis not present

## 2019-07-12 DIAGNOSIS — S76112D Strain of left quadriceps muscle, fascia and tendon, subsequent encounter: Secondary | ICD-10-CM | POA: Diagnosis not present

## 2019-08-30 ENCOUNTER — Other Ambulatory Visit: Payer: Self-pay

## 2019-08-30 ENCOUNTER — Ambulatory Visit: Payer: Self-pay

## 2019-08-30 VITALS — BP 134/82 | HR 64

## 2019-08-30 DIAGNOSIS — Z0130 Encounter for examination of blood pressure without abnormal findings: Secondary | ICD-10-CM

## 2019-08-30 NOTE — Progress Notes (Signed)
Presents requesting BP check.  States this past weekend it was elevated 160ish/116-118.  States he experience some dizziness & just not feeling well.  Does take Amlodipine for BP & said he's been taking it.  Denies chest pain, SOB, headache, ear discomfort, teeth hurting, pain in neck/shoulder area.  York Spaniel he feels some better today than the past few days.  Encouraged to seek treatment at Urgent Care or ED, if BP goes back up and having any other symptoms such as chest pain/discomfort.  Said he's made changes in diet.  Cautioned about hidden salts in diet & other risk factors such as race, age & being male.  States he's exercising.  Encouraged to schedule an appointment with a provider.  States he will monitor BP (has a cuff at home) & if it's going back up, he will call to schedule an appointment.  AMD

## 2019-10-06 ENCOUNTER — Other Ambulatory Visit
Admission: RE | Admit: 2019-10-06 | Discharge: 2019-10-06 | Disposition: A | Discharge status: Home / Self Care | Payer: 59 | Attending: Family Medicine | Admitting: Family Medicine

## 2019-10-06 NOTE — ED Notes (Signed)
Pt arrived with employer rep requesting a UDS.  Consent signed and specimen obtained from pt.  Specimen released to lab via chain of custody protocol.  Soecimen ID no [id number].

## 2020-01-23 DIAGNOSIS — Z20822 Contact with and (suspected) exposure to covid-19: Secondary | ICD-10-CM | POA: Diagnosis not present

## 2020-03-12 DIAGNOSIS — R69 Illness, unspecified: Secondary | ICD-10-CM | POA: Diagnosis not present

## 2020-04-02 DIAGNOSIS — H52223 Regular astigmatism, bilateral: Secondary | ICD-10-CM | POA: Diagnosis not present

## 2020-05-01 DIAGNOSIS — Z0000 Encounter for general adult medical examination without abnormal findings: Secondary | ICD-10-CM | POA: Diagnosis not present

## 2020-05-02 ENCOUNTER — Other Ambulatory Visit: Payer: Self-pay

## 2020-05-02 ENCOUNTER — Ambulatory Visit: Payer: Self-pay

## 2020-05-02 VITALS — BP 142/92 | HR 61 | Resp 14 | Ht 75.0 in | Wt 280.0 lb

## 2020-05-02 DIAGNOSIS — Z0130 Encounter for examination of blood pressure without abnormal findings: Secondary | ICD-10-CM

## 2020-05-02 NOTE — Progress Notes (Signed)
Pt was advised to follow up with PCP. Per patient he had a physical yesterday and bp was 190/110 and was advised to take his bp and pt did not and still hasnt due to forgetting. Pt stated he did have some chest pains and dizziness yesterday but so far not today. Pt was also advised to do something to remind himself to take his bp meds in the morning. If he experience any of those symptoms again he is to go to the closest ER. Gretel Acre

## 2020-09-04 DIAGNOSIS — R55 Syncope and collapse: Secondary | ICD-10-CM | POA: Diagnosis not present

## 2020-09-04 DIAGNOSIS — I10 Essential (primary) hypertension: Secondary | ICD-10-CM | POA: Diagnosis not present

## 2020-09-04 DIAGNOSIS — R635 Abnormal weight gain: Secondary | ICD-10-CM | POA: Diagnosis not present

## 2020-10-03 NOTE — Progress Notes (Signed)
Date:  10/04/2020   ID:  Dominic Joseph, DOB 10/11/1980, MRN [id number]  PCP:  Lin Landsman, MD  Cardiologist:  Rex Kras, DO, [facility]  (established care 10/04/2020)  REASON FOR CONSULT: Syncope and uncontrolled hypertension  REQUESTING PHYSICIAN:  Lin Landsman, [address] [place],  [place] [address]  Chief Complaint  Patient presents with   Loss of Consciousness   Hypertension    uncontrolled   New Patient (Initial Visit)    Referred by Lin Landsman    HPI  Dominic Joseph is a 40 y.o. male who presents to the office with a chief complaint of " history of hypertension, recently passed out." Patient's past medical history and cardiovascular risk factors include: hypertension ,sickle cell trait, lung sarcoidosis (MRI and biopsy diagnosed at age 57), hyperlipidemia, obesity due to excess calories.  He is referred to the office at the request of Lin Landsman, MD for evaluation of syncope and uncontrolled hypertension.  Patient states that he has had benign essential hypertension for the last several years and he is noted his blood pressure to be ranging between 150-190 mmHg.  He used to check his blood pressures only once a month but recently has been doing so more frequently.  When the blood pressures are not well controlled he has symptoms of headaches, vision changes.  No prior history of strokes due to uncontrolled hypertension.  On September 02, 2020 patient woke up and had a bad headache took his medications including his antihypertensive medications.  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  This was a witnessed event.  The by standards available at that time checked his blood sugar, blood pressure, and given cold compressions.  He has not had any reoccurrence of symptoms.  No family history of premature CAD.  However his brother has a history of  cardiomyopathy status post AICD.  FUNCTIONAL STATUS: 1 hour at the gym including cardiovascular workout and resistance training.  ALLERGIES: No Known Allergies  MEDICATION LIST PRIOR TO VISIT: Current Meds  Medication Sig   amLODipine (NORVASC) 10 MG tablet Take 10 mg by mouth every evening.   EPINEPHrine 0.3 mg/0.3 mL IJ SOAJ injection Inject into the muscle.   hydrochlorothiazide (HYDRODIURIL) 25 MG tablet Take 1 tablet (25 mg total) by mouth every morning.   rosuvastatin (CRESTOR) 20 MG tablet Take 20 mg by mouth daily.   Vortioxetine HBr (TRINTELLIX PO) Take by mouth.   [DISCONTINUED] cloNIDine (CATAPRES) 0.2 MG tablet Take 0.2 mg by mouth daily.     PAST MEDICAL HISTORY: Past Medical History:  Diagnosis Date   Depression    Environmental allergies    Hyperlipidemia    Hypertension    Sarcoidosis of lung (HCC)    Syncope and collapse     PAST SURGICAL HISTORY: Past Surgical History:  Procedure Laterality Date   LUNG BIOPSY Left    SHOULDER SURGERY Right    WISDOM TOOTH EXTRACTION      FAMILY HISTORY: The patient family history includes Cardiomyopathy in his brother and father; Hyperlipidemia in his maternal grandmother; Hypertension in his maternal grandmother and mother; Stroke in his maternal grandfather and maternal grandmother.  SOCIAL HISTORY:  The patient  reports that he has been smoking cigars. He has never used smokeless tobacco. He reports current alcohol use. He reports previous drug use. Drug: Marijuana.  REVIEW OF SYSTEMS: Review of Systems  Constitutional: Negative for chills and fever.  HENT:  Negative for hoarse voice and nosebleeds.   Eyes:  Negative for discharge, double vision and pain.  Cardiovascular:  Positive for syncope. Negative for chest pain, claudication, dyspnea on exertion, leg swelling, near-syncope, orthopnea, palpitations and paroxysmal nocturnal dyspnea.  Respiratory:  Negative for hemoptysis and shortness of breath.    Musculoskeletal:  Negative for muscle cramps and myalgias.  Gastrointestinal:  Negative for abdominal pain, constipation, diarrhea, hematemesis, hematochezia, melena, nausea and vomiting.  Neurological:  Positive for dizziness (improving), headaches (improving) and light-headedness (improving).   PHYSICAL EXAM: Vitals with BMI 10/04/2020 05/02/2020 05/02/2020  Height 6' 3" - 6' 3"  Weight 285 lbs - 280 lbs  BMI 28.76 - 35  Systolic 811 572 620  Diastolic 89 92 99  Pulse 61 - 61   Orthostatic VS for the past 72 hrs (Last 3 readings):  Orthostatic BP Patient Position BP Location Cuff Size Orthostatic Pulse  10/04/20 1250 155/90 Standing Left Arm Large 66  10/04/20 1249 (!) 150/94 Sitting Left Arm Large 61  10/04/20 1247 148/89 Supine Left Arm Large 60   CONSTITUTIONAL: Well-developed and well-nourished. No acute distress.  SKIN: Skin is warm and dry. No rash noted. No cyanosis. No pallor. No jaundice HEAD: Normocephalic and atraumatic.  EYES: No scleral icterus MOUTH/THROAT: Moist oral membranes.  NECK: No JVD present. No thyromegaly noted. No carotid bruits  LYMPHATIC: No visible cervical adenopathy.  CHEST Normal respiratory effort. No intercostal retractions  LUNGS: Clear to auscultation bilaterally.  No stridor. No wheezes. No rales.  CARDIOVASCULAR: Regular rate and rhythm, positive S1-S2, no murmurs rubs or gallops appreciated. ABDOMINAL: Obese, soft, nontender, nondistended, positive bowel sounds in all 4 quadrants no apparent ascites.  EXTREMITIES: No peripheral edema, warm to touch bilaterally, 2+ dorsalis pedis and posterior tibial pulses HEMATOLOGIC: No significant bruising NEUROLOGIC: Oriented to person, place, and time. Nonfocal. Normal muscle tone.  PSYCHIATRIC: Normal mood and affect. Normal behavior. Cooperative  CARDIAC DATABASE: EKG: 10/04/2020: Sinus bradycardia, 59 bpm, normal axis, without underlying ischemia or injury pattern.  Echocardiogram: No results found  for this or any previous visit from the past 1095 days.   Stress Testing: No results found for this or any previous visit from the past 1095 days.   Heart Catheterization: None  LABORATORY DATA: CBC Latest Ref Rng & Units 06/30/2019 08/13/2016 01/01/2014  WBC 3.4 - 10.8 x10E3/uL 3.9 - 4.5  Hemoglobin 13.0 - 17.7 g/dL 16.4 16.0 15.1  Hematocrit 37.5 - 51.0 % 49.0 47.0 43.1  Platelets 150 - 450 x10E3/uL 255 - 206    CMP Latest Ref Rng & Units 06/30/2019 08/13/2016 01/01/2014  Glucose 65 - 99 mg/dL 93 99 93  BUN 6 - 20 mg/dL _0 Creatinine 0.76 - 1.27 mg/dL 1.33(H) 1.10 1.15  Sodium 134 - 144 mmol/L 145(H) 142 140  Potassium 3.5 - 5.2 mmol/L 4.3 4.0 4.1  Chloride 96 - 106 mmol/L 106 104 105  CO2 19 - 32 mEq/L - - 22  Calcium 8.7 - 10.2 mg/dL 9.4 - 9.0  Total Protein 6.0 - 8.5 g/dL 6.9 - -  Total Bilirubin 0.0 - 1.2 mg/dL 0.5 - -  Alkaline Phos 39 - 117 IU/L 51 - -  AST 0 - 40 IU/L 33 - -  ALT 0 - 44 IU/L 41 - -    Lipid Panel     Component Value Date/Time   CHOL 187 06/30/2019 0830   TRIG 71 06/30/2019 0830  HDL 53 06/30/2019 0830   CHOLHDL 3.5 06/30/2019 0830   LDLCALC 121 (H) 06/30/2019 0830   LABVLDL 13 06/30/2019 0830    No components found for: NTPROBNP No results for input(s): PROBNP in the last 8760 hours. No results for input(s): TSH in the last 8760 hours.  BMP No results for input(s): NA, K, CL, CO2, GLUCOSE, BUN, CREATININE, CALCIUM, GFRNONAA, GFRAA in the last 8760 hours.  HEMOGLOBIN A1C No results found for: HGBA1C, MPG  External Labs:  Date Collected: 05/02/2020 , information obtained by primary care provider Potassium: 4.1 Creatinine 1.39 mg/dL. eGFR: 73 mL/min per 1.73 m Hemoglobin: 15.2 g/dL and hematocrit: 45.2 % Lipid profile: Total cholesterol 186 , triglycerides 78 , HDL 41 , LDL 128 non-HDL 145 AST: 21 , ALT: 19 , alkaline phosphatase: 50  TSH: 1.30    IMPRESSION:    ICD-10-CM   1. Syncope and collapse  R55 PCV ECHOCARDIOGRAM  COMPLETE    PCV CARDIAC STRESS TEST    LONG TERM MONITOR (3-14 DAYS)    2. Benign hypertension  I10 EKG 12-Lead    hydrochlorothiazide (HYDRODIURIL) 25 MG tablet    Basic metabolic panel    Magnesium    3. Mixed hyperlipidemia  E78.2        RECOMMENDATIONS: JAESHAWN SILVIO is a 40 y.o. male whose past medical history and cardiac risk factors include:  hypertension ,sickle cell trait, lung sarcoidosis (MRI and biopsy diagnosed at age 62), hyperlipidemia, obesity due to excess calories.  Syncope: Most likely secondary to vasovagal etiology. Orthostatic vital signs negative. Echocardiogram will be ordered to evaluate for structural heart disease and left ventricular systolic function. Exercise treadmill stress test to evaluate for functional status and exercise induced ischemia. 14-day extended Holter monitor to evaluate for dysrhythmias. Driving restrictions discussed Most recent labs provided by PCP independently reviewed and noted above  Benign essential hypertension: Medications reconciled Office blood pressures are currently not at goal Recommend weaning off of clonidine.  He has been on the medication for at least 1 month.  Recommended taking clonidine as prescribed every other day for 1 week and then stop Will start hydrochlorothiazide 25 mg p.o. every morning Advised to take amlodipine at night. Recommended to keep a log of his blood pressures and to bring it in at the next office visit  Hyperlipidemia: Currently started on statin therapy by his PCP. Tolerating the statin therapy well without any side effects or intolerances. Currently managed by primary care provider.  FINAL MEDICATION LIST END OF ENCOUNTER: Meds ordered this encounter  Medications   hydrochlorothiazide (HYDRODIURIL) 25 MG tablet    Sig: Take 1 tablet (25 mg total) by mouth every morning.    Dispense:  90 tablet    Refill:  0      Current Outpatient Medications:    amLODipine (NORVASC) 10 MG  tablet, Take 10 mg by mouth every evening., Disp: , Rfl:    EPINEPHrine 0.3 mg/0.3 mL IJ SOAJ injection, Inject into the muscle., Disp: , Rfl:    hydrochlorothiazide (HYDRODIURIL) 25 MG tablet, Take 1 tablet (25 mg total) by mouth every morning., Disp: 90 tablet, Rfl: 0   rosuvastatin (CRESTOR) 20 MG tablet, Take 20 mg by mouth daily., Disp: , Rfl:    Vortioxetine HBr (TRINTELLIX PO), Take by mouth., Disp: , Rfl:   Orders Placed This Encounter  Procedures   Basic metabolic panel   Magnesium   PCV CARDIAC STRESS TEST   LONG TERM MONITOR (3-14 DAYS)   EKG  12-Lead   PCV ECHOCARDIOGRAM COMPLETE    There are no Patient Instructions on file for this visit.   --Continue cardiac medications as reconciled in final medication list. --Return in about 6 weeks (around 11/15/2020) for Follow up, BP, Review test results. Or sooner if needed. --Continue follow-up with your primary care physician regarding the management of your other chronic comorbid conditions.  Patient's questions and concerns were addressed to his satisfaction. He voices understanding of the instructions provided during this encounter.   This note was created using a voice recognition software as a result there may be grammatical errors inadvertently enclosed that do not reflect the nature of this encounter. Every attempt is made to correct such errors.  Rex Kras, [place], [facility]  Pager: [phone] Office: [phone]

## 2020-10-04 ENCOUNTER — Encounter: Payer: Self-pay | Admitting: Cardiology

## 2020-10-04 ENCOUNTER — Other Ambulatory Visit: Payer: Self-pay

## 2020-10-04 ENCOUNTER — Ambulatory Visit: Payer: 59 | Admitting: Cardiology

## 2020-10-04 VITALS — BP 157/89 | HR 61 | Temp 98.3°F | Resp 16 | Ht 75.0 in | Wt 285.0 lb

## 2020-10-04 DIAGNOSIS — R55 Syncope and collapse: Secondary | ICD-10-CM | POA: Diagnosis not present

## 2020-10-04 DIAGNOSIS — I10 Essential (primary) hypertension: Secondary | ICD-10-CM | POA: Diagnosis not present

## 2020-10-04 DIAGNOSIS — E782 Mixed hyperlipidemia: Secondary | ICD-10-CM | POA: Diagnosis not present

## 2020-10-04 MED ORDER — HYDROCHLOROTHIAZIDE 25 MG PO TABS: 25.0000 mg | ORAL_TABLET | Freq: Every morning | ORAL | 0 refills | Status: DC

## 2020-10-09 ENCOUNTER — Other Ambulatory Visit: Payer: Self-pay

## 2020-10-09 DIAGNOSIS — I10 Essential (primary) hypertension: Secondary | ICD-10-CM

## 2020-10-16 DIAGNOSIS — I10 Essential (primary) hypertension: Secondary | ICD-10-CM | POA: Diagnosis not present

## 2020-10-17 LAB — BASIC METABOLIC PANEL
BUN/Creatinine Ratio: 11 (ref 9–20)
BUN: 13 mg/dL (ref 6–24)
CO2: 26 mmol/L (ref 20–29)
Calcium: 9.5 mg/dL (ref 8.7–10.2)
Chloride: 103 mmol/L (ref 96–106)
Creatinine, Ser: 1.22 mg/dL (ref 0.76–1.27)
Glucose: 97 mg/dL (ref 65–99)
Potassium: 3.8 mmol/L (ref 3.5–5.2)
Sodium: 143 mmol/L (ref 134–144)
eGFR: 77 mL/min/{1.73_m2} (ref >59)

## 2020-10-17 LAB — MAGNESIUM: Magnesium: 2.2 mg/dL (ref 1.6–2.3)

## 2020-10-18 ENCOUNTER — Ambulatory Visit: Payer: 59

## 2020-10-18 ENCOUNTER — Other Ambulatory Visit: Payer: Self-pay

## 2020-10-18 DIAGNOSIS — R55 Syncope and collapse: Secondary | ICD-10-CM | POA: Diagnosis not present

## 2020-10-22 NOTE — Progress Notes (Signed)
Called and spoke with patient regarding his recent lab results.

## 2020-10-26 ENCOUNTER — Inpatient Hospital Stay: Payer: 59

## 2020-10-26 ENCOUNTER — Other Ambulatory Visit: Payer: Self-pay

## 2020-10-26 ENCOUNTER — Ambulatory Visit: Payer: 59

## 2020-10-26 DIAGNOSIS — R55 Syncope and collapse: Secondary | ICD-10-CM

## 2020-11-02 NOTE — Progress Notes (Signed)
Spoke to patient he is aware

## 2020-11-09 DIAGNOSIS — Z20822 Contact with and (suspected) exposure to covid-19: Secondary | ICD-10-CM | POA: Diagnosis not present

## 2020-11-12 DIAGNOSIS — R55 Syncope and collapse: Secondary | ICD-10-CM | POA: Diagnosis not present

## 2020-11-13 ENCOUNTER — Ambulatory Visit: Payer: 59 | Admitting: Cardiology

## 2020-11-15 DIAGNOSIS — U071 COVID-19: Secondary | ICD-10-CM | POA: Diagnosis not present

## 2020-11-15 DIAGNOSIS — I10 Essential (primary) hypertension: Secondary | ICD-10-CM | POA: Diagnosis not present

## 2020-11-26 DIAGNOSIS — R55 Syncope and collapse: Secondary | ICD-10-CM | POA: Diagnosis not present

## 2020-11-27 ENCOUNTER — Encounter: Payer: Self-pay | Admitting: Cardiology

## 2020-11-27 ENCOUNTER — Ambulatory Visit: Payer: 59 | Admitting: Cardiology

## 2020-11-27 ENCOUNTER — Other Ambulatory Visit: Payer: Self-pay

## 2020-11-27 VITALS — BP 147/82 | HR 67 | Temp 98.4°F | Resp 16 | Ht 75.0 in | Wt 295.0 lb

## 2020-11-27 DIAGNOSIS — E782 Mixed hyperlipidemia: Secondary | ICD-10-CM

## 2020-11-27 DIAGNOSIS — I10 Essential (primary) hypertension: Secondary | ICD-10-CM | POA: Diagnosis not present

## 2020-11-27 DIAGNOSIS — R55 Syncope and collapse: Secondary | ICD-10-CM | POA: Diagnosis not present

## 2020-11-27 MED ORDER — METOPROLOL TARTRATE 25 MG PO TABS: 25.0000 mg | ORAL_TABLET | Freq: Two times a day (BID) | ORAL | 0 refills | Status: DC

## 2020-11-27 NOTE — Progress Notes (Signed)
ID:  Dominic Joseph, DOB [birth year], MRN [id number]  PCP:  Lin Landsman, MD  Cardiologist:  Rex Kras, DO, [facility]  (established care 10/04/2020)  Date: 11/27/20 Last Office Visit: 10/04/2020  Chief Complaint  Patient presents with   Hypertension   Results   Follow-up    HPI  Dominic Joseph is a 40 y.o. male who presents to the office with a chief complaint of " follow-up for blood pressure management and discuss test results. " Patient's past medical history and cardiovascular risk factors include: hypertension ,sickle cell trait, lung sarcoidosis (MRI and biopsy diagnosed at age 76), hyperlipidemia, obesity due to excess calories.  He is referred to the office at the request of Lin Landsman, MD for evaluation of syncope and uncontrolled hypertension.  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  The event was most likely secondary to vasovagal etiology.  At the last office visit his orthostatic vital signs were negative, EKG overall nonischemic.  This year decision was to proceed with echocardiogram, exercise treadmill stress test and a 14-day extended Holter monitor.  Results of the echo and Holter monitor discussed with him in great detail and noted below for further reference.  With regards to stress test stress ECG was negative for ischemia as patient did not achieve 85% of maximum predicted heart rate, noted to have poor functional capacity for age, hypertensive response to exercise, and upsloping ST depressions lasted up to few minutes into recovery.  Since last office visit patient has not had any reoccurrence of syncope.  His blood pressures at home range between 120-140 mmHg.  He tolerated the initiation of hydrochlorothiazide well without any side effects or intolerances.  He has weaned himself off of clonidine and takes his amlodipine at night.  No  family history of premature CAD.  However his brother has a history of cardiomyopathy status post AICD.  FUNCTIONAL STATUS: 1 hour at the gym including cardiovascular workout and resistance training.  ALLERGIES: No Known Allergies  MEDICATION LIST PRIOR TO VISIT: Current Meds  Medication Sig   amLODipine (NORVASC) 10 MG tablet Take 10 mg by mouth every evening.   EPINEPHrine 0.3 mg/0.3 mL IJ SOAJ injection Inject into the muscle.   fexofenadine (ALLEGRA) 180 MG tablet Take 180 mg by mouth daily.   hydrochlorothiazide (HYDRODIURIL) 25 MG tablet Take 1 tablet (25 mg total) by mouth every morning.   metoprolol tartrate (LOPRESSOR) 25 MG tablet Take 1 tablet (25 mg total) by mouth 2 (two) times daily for 14 days.   promethazine (PHENERGAN) 6.25 MG/5ML syrup Take 6.25 mg by mouth every 4 (four) hours as needed.   rosuvastatin (CRESTOR) 20 MG tablet Take 20 mg by mouth daily.   Vortioxetine HBr (TRINTELLIX PO) Take by mouth.     PAST MEDICAL HISTORY: Past Medical History:  Diagnosis Date   Depression    Environmental allergies    Hyperlipidemia    Hypertension    Sarcoidosis of lung (HCC)    Syncope and collapse     PAST SURGICAL HISTORY: Past Surgical History:  Procedure Laterality Date   LUNG BIOPSY Left    SHOULDER SURGERY Right    WISDOM TOOTH EXTRACTION      FAMILY HISTORY: The patient family history includes Cardiomyopathy in his brother and father; Hyperlipidemia in his maternal grandmother; Hypertension in his maternal grandmother and mother; Stroke in his maternal grandfather  and maternal grandmother.  SOCIAL HISTORY:  The patient  reports that he has been smoking cigars. He has never used smokeless tobacco. He reports current alcohol use. He reports that he does not currently use drugs after having used the following drugs: Marijuana.  REVIEW OF SYSTEMS: Review of Systems  Constitutional: Positive for malaise/fatigue. Negative for chills and fever.  HENT:   Negative for hoarse voice and nosebleeds.   Eyes:  Negative for discharge, double vision and pain.  Cardiovascular:  Negative for chest pain, claudication, dyspnea on exertion, leg swelling, near-syncope, orthopnea, palpitations, paroxysmal nocturnal dyspnea and syncope.  Respiratory:  Negative for hemoptysis and shortness of breath.   Musculoskeletal:  Negative for muscle cramps and myalgias.  Gastrointestinal:  Negative for abdominal pain, constipation, diarrhea, hematemesis, hematochezia, melena, nausea and vomiting.  Neurological:  Positive for dizziness (improving), headaches (improving) and light-headedness (improving).   PHYSICAL EXAM: Vitals with BMI 11/27/2020 10/04/2020 05/02/2020  Height 6' 3" 6' 3" -  Weight 295 lbs 285 lbs -  BMI 62.37 62.83 -  Systolic 151 761 607  Diastolic 82 89 92  Pulse 67 61 -   CONSTITUTIONAL: Well-developed and well-nourished. No acute distress.  SKIN: Skin is warm and dry. No rash noted. No cyanosis. No pallor. No jaundice HEAD: Normocephalic and atraumatic.  EYES: No scleral icterus MOUTH/THROAT: Moist oral membranes.  NECK: No JVD present. No thyromegaly noted. No carotid bruits  LYMPHATIC: No visible cervical adenopathy.  CHEST Normal respiratory effort. No intercostal retractions  LUNGS: Clear to auscultation bilaterally.  No stridor. No wheezes. No rales.  CARDIOVASCULAR: Regular rate and rhythm, positive S1-S2, no murmurs rubs or gallops appreciated. ABDOMINAL: Obese, soft, nontender, nondistended, positive bowel sounds in all 4 quadrants no apparent ascites.  EXTREMITIES: No peripheral edema, warm to touch bilaterally, 2+ dorsalis pedis and posterior tibial pulses HEMATOLOGIC: No significant bruising NEUROLOGIC: Oriented to person, place, and time. Nonfocal. Normal muscle tone.  PSYCHIATRIC: Normal mood and affect. Normal behavior. Cooperative  CARDIAC DATABASE: EKG: 10/04/2020: Sinus bradycardia, 59 bpm, normal axis, without underlying  ischemia or injury pattern.  Echocardiogram: 10/18/2020: Left ventricle cavity is normal in size. Mild concentric hypertrophy of the left ventricle. Normal global wall motion. Normal LV systolic function with EF 56%. Normal diastolic filling pattern. Mild tricuspid regurgitation. No evidence of pulmonary hypertension.   Stress Testing: Exercise treadmill stress test 10/26/2020: Functional status: Poor. Chest pain: None.  Reason for stopping exercise: Weakness and fatigue. Hypertensive response to exercise: Yes, peak blood pressure 200/80 mmHg. Exercise time 6 minutes 45 seconds on Bruce protocol, achieved 8.19 METS, and 84% of age-predicted maximum heart rate. Stress ECG non-diagnostic for ischemia - did not achieve 85%APMHR. Clinical correlation is required due to submaximal workload, poor functional capacity for age, hypertensive response to exercise, upsloping ST depressions noted on stress ECG recovered after 3 minutes into recovery. Consider further cardiac workup.  Heart Catheterization: None  14 day extended Holter monitor: Patch Wear Time: 11 days and 02 hours  Dominant rhythm normal sinus. Heart rate 49-192 bpm. Avg HR 72 bpm. No atrial fibrillation, supraventricular or ventricular tachycardia, high grade AV block, pauses (3 seconds or longer). Total ventricular ectopic burden <1%. Total supraventricular ectopic burden <1%. Patient triggered events: 8. These did not correlate with any significant dysrhythmias.  LABORATORY DATA: CBC Latest Ref Rng & Units 06/30/2019 08/13/2016 01/01/2014  WBC 3.4 - 10.8 x10E3/uL 3.9 - 4.5  Hemoglobin 13.0 - 17.7 g/dL 16.4 16.0 15.1  Hematocrit 37.5 - 51.0 % 49.0 47.0  43.1  Platelets 150 - 450 x10E3/uL 255 - 206    CMP Latest Ref Rng & Units 10/16/2020 06/30/2019 08/13/2016  Glucose 65 - 99 mg/dL 97 93 99  BUN 6 - 24 mg/dL _0 Creatinine 0.76 - 1.27 mg/dL 1.22 1.33(H) 1.10  Sodium 134 - 144 mmol/L 143 145(H) 142  Potassium 3.5 - 5.2  mmol/L 3.8 4.3 4.0  Chloride 96 - 106 mmol/L 103 106 104  CO2 20 - 29 mmol/L 26 - -  Calcium 8.7 - 10.2 mg/dL 9.5 9.4 -  Total Protein 6.0 - 8.5 g/dL - 6.9 -  Total Bilirubin 0.0 - 1.2 mg/dL - 0.5 -  Alkaline Phos 39 - 117 IU/L - 51 -  AST 0 - 40 IU/L - 33 -  ALT 0 - 44 IU/L - 41 -    Lipid Panel     Component Value Date/Time   CHOL 187 06/30/2019 0830   TRIG 71 06/30/2019 0830   HDL 53 06/30/2019 0830   CHOLHDL 3.5 06/30/2019 0830   LDLCALC 121 (H) 06/30/2019 0830   LABVLDL 13 06/30/2019 0830    No components found for: NTPROBNP No results for input(s): PROBNP in the last 8760 hours. No results for input(s): TSH in the last 8760 hours.  BMP Recent Labs    10/16/20 0723  NA 143  K 3.8  CL 103  CO2 26  GLUCOSE 97  BUN 13  CREATININE 1.22  CALCIUM 9.5    HEMOGLOBIN A1C No results found for: HGBA1C, MPG  External Labs:  Date Collected: 05/02/2020 , information obtained by primary care provider Potassium: 4.1 Creatinine 1.39 mg/dL. eGFR: 73 mL/min per 1.73 m Hemoglobin: 15.2 g/dL and hematocrit: 45.2 % Lipid profile: Total cholesterol 186 , triglycerides 78 , HDL 41 , LDL 128 non-HDL 145 AST: 21 , ALT: 19 , alkaline phosphatase: 50  TSH: 1.30    IMPRESSION:    ICD-10-CM   1. Syncope and collapse  R55 CT CORONARY MORPH W/CTA COR W/SCORE W/CA W/CM &/OR WO/CM    metoprolol tartrate (LOPRESSOR) 25 MG tablet    Basic metabolic panel    2. Benign hypertension  I10     3. Mixed hyperlipidemia  E78.2        RECOMMENDATIONS: Dominic Joseph is a 40 y.o. male whose past medical history and cardiac risk factors include:  hypertension ,sickle cell trait, lung sarcoidosis (MRI and biopsy diagnosed at age 58), hyperlipidemia, obesity due to excess calories.  Syncope: Most likely secondary to vasovagal etiology. Orthostatic vital signs negative. EKG negative for ischemia. Echo cardiogram notes preserved LVEF, normal diastolic, no significant valvular heart  disease Exercise treadmill stress test: Submaximal workload, poor functional capacity for age, hypertensive response to exercise 14-day extended Holter monitor: Overall normal sinus rhythm without dysrhythmias. Given the recent history of syncope and submaximal GXT would recommended further testing.  Discussed exercise nuclear stress test vs. CCTA and patient would like to proceed w/ CCTA.  Check BMP. Lopressor 25 mg p.o. twice daily.  Benign essential hypertension: Improving. Prior to establishing care his SBP would range between 150-190 mmHg. Home blood pressures are improving but currently not at goal. Will start Lopressor 25 mg p.o. twice daily as noted above. Recommended to keep a log of his blood pressures and to bring it in at the next office visit  Hyperlipidemia: Currently started on statin therapy by his PCP. Tolerating the statin therapy well without any side effects or intolerances. Currently managed by primary  care provider.  FINAL MEDICATION LIST END OF ENCOUNTER: Meds ordered this encounter  Medications   metoprolol tartrate (LOPRESSOR) 25 MG tablet    Sig: Take 1 tablet (25 mg total) by mouth 2 (two) times daily for 14 days.    Dispense:  28 tablet    Refill:  0      Current Outpatient Medications:    amLODipine (NORVASC) 10 MG tablet, Take 10 mg by mouth every evening., Disp: , Rfl:    EPINEPHrine 0.3 mg/0.3 mL IJ SOAJ injection, Inject into the muscle., Disp: , Rfl:    fexofenadine (ALLEGRA) 180 MG tablet, Take 180 mg by mouth daily., Disp: , Rfl:    hydrochlorothiazide (HYDRODIURIL) 25 MG tablet, Take 1 tablet (25 mg total) by mouth every morning., Disp: 90 tablet, Rfl: 0   metoprolol tartrate (LOPRESSOR) 25 MG tablet, Take 1 tablet (25 mg total) by mouth 2 (two) times daily for 14 days., Disp: 28 tablet, Rfl: 0   promethazine (PHENERGAN) 6.25 MG/5ML syrup, Take 6.25 mg by mouth every 4 (four) hours as needed., Disp: , Rfl:    rosuvastatin (CRESTOR) 20 MG  tablet, Take 20 mg by mouth daily., Disp: , Rfl:    Vortioxetine HBr (TRINTELLIX PO), Take by mouth., Disp: , Rfl:   Orders Placed This Encounter  Procedures   CT CORONARY MORPH W/CTA COR W/SCORE W/CA W/CM &/OR WO/CM   Basic metabolic panel    There are no Patient Instructions on file for this visit.   --Continue cardiac medications as reconciled in final medication list. --Return in about 4 weeks (around 12/25/2020) for Follow up, Review test results. Or sooner if needed. --Continue follow-up with your primary care physician regarding the management of your other chronic comorbid conditions.  Patient's questions and concerns were addressed to his satisfaction. He voices understanding of the instructions provided during this encounter.   This note was created using a voice recognition software as a result there may be grammatical errors inadvertently enclosed that do not reflect the nature of this encounter. Every attempt is made to correct such errors.  Rex Kras, [place], [facility]  Pager: [phone] Office: [phone]

## 2020-12-07 ENCOUNTER — Telehealth (HOSPITAL_COMMUNITY): Payer: Self-pay | Admitting: *Deleted

## 2020-12-07 DIAGNOSIS — R55 Syncope and collapse: Secondary | ICD-10-CM | POA: Diagnosis not present

## 2020-12-07 NOTE — Telephone Encounter (Signed)
Attempted to call patient regarding upcoming cardiac CT appointment. °Left message on voicemail with name and callback number ° °Chas Axel RN Navigator Cardiac Imaging °[facility] °[phone] Office °[phone] Cell ° °

## 2020-12-08 LAB — BASIC METABOLIC PANEL
BUN/Creatinine Ratio: 13 (ref 9–20)
BUN: 18 mg/dL (ref 6–24)
CO2: 25 mmol/L (ref 20–29)
Calcium: 9.7 mg/dL (ref 8.7–10.2)
Chloride: 102 mmol/L (ref 96–106)
Creatinine, Ser: 1.43 mg/dL — ABNORMAL HIGH (ref 0.76–1.27)
Glucose: 99 mg/dL (ref 65–99)
Potassium: 3.9 mmol/L (ref 3.5–5.2)
Sodium: 143 mmol/L (ref 134–144)
eGFR: 64 mL/min/{1.73_m2} (ref >59)

## 2020-12-10 ENCOUNTER — Telehealth (HOSPITAL_COMMUNITY): Payer: Self-pay | Admitting: *Deleted

## 2020-12-10 NOTE — Telephone Encounter (Signed)
Reaching out to patient to offer assistance regarding upcoming cardiac imaging study; pt verbalizes understanding of appt date/time, parking situation and where to check in, pre-test NPO status and medications ordered, and verified current allergies; name and call back number provided for further questions should they arise  Enrique Weiss RN Navigator Cardiac Imaging [facility] [phone] office [phone] cell  Patient to take 25mg metoprolol tartrate two hours prior to cardiac CT scan. 

## 2020-12-11 ENCOUNTER — Encounter (HOSPITAL_COMMUNITY): Payer: Self-pay

## 2020-12-11 ENCOUNTER — Ambulatory Visit (HOSPITAL_COMMUNITY)
Admission: RE | Admit: 2020-12-11 | Discharge: 2020-12-11 | Disposition: A | Discharge status: Home / Self Care | Payer: 59 | Source: Ambulatory Visit | Attending: Cardiology | Admitting: Cardiology

## 2020-12-11 ENCOUNTER — Other Ambulatory Visit: Payer: Self-pay

## 2020-12-11 DIAGNOSIS — R55 Syncope and collapse: Secondary | ICD-10-CM

## 2020-12-11 DIAGNOSIS — I517 Cardiomegaly: Secondary | ICD-10-CM | POA: Insufficient documentation

## 2020-12-11 MED ORDER — NITROGLYCERIN 0.4 MG SL SUBL
SUBLINGUAL_TABLET | SUBLINGUAL | Status: AC
  Filled 2020-12-11: qty 2

## 2020-12-11 MED ORDER — NITROGLYCERIN 0.4 MG SL SUBL
0.8000 mg | SUBLINGUAL_TABLET | Freq: Once | SUBLINGUAL | Status: AC
  Administered 2020-12-11: 0.8 mg via SUBLINGUAL

## 2020-12-11 MED ORDER — IOHEXOL 350 MG/ML SOLN
95.0000 mL | Freq: Once | INTRAVENOUS | Status: AC | PRN
  Administered 2020-12-11: 95 mL via INTRAVENOUS

## 2020-12-12 DIAGNOSIS — R55 Syncope and collapse: Secondary | ICD-10-CM | POA: Insufficient documentation

## 2020-12-13 NOTE — Progress Notes (Signed)
Spoke to patient - he is aware of his results. 

## 2020-12-28 ENCOUNTER — Encounter: Payer: Self-pay | Admitting: Cardiology

## 2020-12-28 ENCOUNTER — Ambulatory Visit: Payer: 59 | Admitting: Cardiology

## 2020-12-28 ENCOUNTER — Other Ambulatory Visit: Payer: Self-pay

## 2020-12-28 VITALS — BP 133/79 | HR 64 | Temp 97.9°F | Resp 17 | Ht 75.0 in | Wt 290.8 lb

## 2020-12-28 DIAGNOSIS — R55 Syncope and collapse: Secondary | ICD-10-CM | POA: Diagnosis not present

## 2020-12-28 DIAGNOSIS — Z712 Person consulting for explanation of examination or test findings: Secondary | ICD-10-CM

## 2020-12-28 DIAGNOSIS — E782 Mixed hyperlipidemia: Secondary | ICD-10-CM

## 2020-12-28 DIAGNOSIS — I10 Essential (primary) hypertension: Secondary | ICD-10-CM

## 2020-12-28 NOTE — Progress Notes (Signed)
ID:  Dominic Joseph, DOB 03-28-1980, MRN [id number]  PCP:  Lin Landsman, MD  Cardiologist:  Rex Kras, DO, [facility]  (established care 10/04/2020)  Date: 12/28/20 Last Office Visit: 11/27/2020  Chief Complaint  Patient presents with   Follow-up    4 WEEKS   Results    HPI Dominic Joseph is a 40 y.o. male who presents to the office with a chief complaint of " 4-week follow-up for reevaluation syncope and review CCTA results. " Patient's past medical history and cardiovascular risk factors include: hypertension ,sickle cell trait, lung sarcoidosis (MRI and biopsy diagnosed at age 27), hyperlipidemia, obesity due to excess calories.  He is referred to the office at the request of Lin Landsman, MD for evaluation of syncope and uncontrolled hypertension.  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  His syncopal event was most likely secondary to vasovagal etiology but was referred to cardiology for further evaluation and management. In the past his orthostatic vital signs have been negative, ECG overall nonischemic, he has undergone an extended Holter monitor, echocardiogram, and GXT.  Results of which were reviewed with him in great detail during prior office visits.  However, his GXT was nondiagnostic as he did not achieve 85% of maximum predicted heart rate.  In addition, he had poor functional capacity for age, hypertensive response to exercise, and upsloping ST depressions noted 3 minutes into recovery.  Given the results of the GXT we discussed either undergoing stress test or coronary CTA.  Patient decided to undergo coronary CTA and is here to discuss test results.  In addition, since last office visit patient states that his blood pressures are very well controlled with SBP ranging between 120-130 mmHg.  And has not had any reoccurrence of syncopal event.  No family  history of premature CAD.  However his brother has a history of cardiomyopathy status post AICD.  FUNCTIONAL STATUS: 1 hour at the gym including cardiovascular workout and resistance training.  ALLERGIES: No Known Allergies  MEDICATION LIST PRIOR TO VISIT: Current Meds  Medication Sig   amLODipine (NORVASC) 10 MG tablet Take 10 mg by mouth every evening.   EPINEPHrine 0.3 mg/0.3 mL IJ SOAJ injection Inject into the muscle.   fexofenadine (ALLEGRA) 180 MG tablet Take 180 mg by mouth daily.   hydrochlorothiazide (HYDRODIURIL) 25 MG tablet Take 1 tablet (25 mg total) by mouth every morning.   rosuvastatin (CRESTOR) 20 MG tablet Take 20 mg by mouth daily.   Vortioxetine HBr (TRINTELLIX PO) Take 5 mg by mouth daily.     PAST MEDICAL HISTORY: Past Medical History:  Diagnosis Date   Depression    Environmental allergies    Hyperlipidemia    Hypertension    Sarcoidosis of lung (HCC)    Syncope and collapse     PAST SURGICAL HISTORY: Past Surgical History:  Procedure Laterality Date   LUNG BIOPSY Left    SHOULDER SURGERY Right    WISDOM TOOTH EXTRACTION      FAMILY HISTORY: The patient family history includes Cardiomyopathy in his brother and father; Hyperlipidemia in his maternal grandmother; Hypertension in his brother, maternal grandmother, mother, sister, and sister; Stroke in his maternal grandfather and maternal grandmother.  SOCIAL HISTORY:  The patient  reports that he has been smoking cigars. He has never used smokeless tobacco. He reports current alcohol use. He reports that he does not currently  use drugs after having used the following drugs: Marijuana.  REVIEW OF SYSTEMS: Review of Systems  Constitutional: Negative for chills, fever and malaise/fatigue.  HENT:  Negative for hoarse voice and nosebleeds.   Eyes:  Negative for discharge, double vision and pain.  Cardiovascular:  Negative for chest pain, claudication, dyspnea on exertion, leg swelling, near-syncope,  orthopnea, palpitations, paroxysmal nocturnal dyspnea and syncope.  Respiratory:  Negative for hemoptysis and shortness of breath.   Musculoskeletal:  Negative for muscle cramps and myalgias.  Gastrointestinal:  Negative for abdominal pain, constipation, diarrhea, hematemesis, hematochezia, melena, nausea and vomiting.  Neurological:  Negative for dizziness, headaches, light-headedness and numbness.   PHYSICAL EXAM: Vitals with BMI 12/28/2020 12/28/2020 12/11/2020  Height - 6' 3"  -  Weight - 290 lbs 13 oz -  BMI - 16.55 -  Systolic 374 827 078  Diastolic 79 70 74  Pulse 64 66 68   CONSTITUTIONAL: Well-developed and well-nourished. No acute distress.  SKIN: Skin is warm and dry. No rash noted. No cyanosis. No pallor. No jaundice HEAD: Normocephalic and atraumatic.  EYES: No scleral icterus MOUTH/THROAT: Moist oral membranes.  NECK: No JVD present. No thyromegaly noted. No carotid bruits  LYMPHATIC: No visible cervical adenopathy.  CHEST Normal respiratory effort. No intercostal retractions  LUNGS: Clear to auscultation bilaterally.  No stridor. No wheezes. No rales.  CARDIOVASCULAR: Regular rate and rhythm, positive S1-S2, no murmurs rubs or gallops appreciated. ABDOMINAL: Obese, soft, nontender, nondistended, positive bowel sounds in all 4 quadrants no apparent ascites.  EXTREMITIES: No peripheral edema, warm to touch bilaterally, 2+ dorsalis pedis and posterior tibial pulses HEMATOLOGIC: No significant bruising NEUROLOGIC: Oriented to person, place, and time. Nonfocal. Normal muscle tone.  PSYCHIATRIC: Normal mood and affect. Normal behavior. Cooperative  CARDIAC DATABASE: EKG: 10/04/2020: Sinus bradycardia, 59 bpm, normal axis, without underlying ischemia or injury pattern.  Echocardiogram: 10/18/2020: Left ventricle cavity is normal in size. Mild concentric hypertrophy of the left ventricle. Normal global wall motion. Normal LV systolic function with EF 56%. Normal diastolic  filling pattern. Mild tricuspid regurgitation. No evidence of pulmonary hypertension.   Stress Testing: Exercise treadmill stress test 10/26/2020: Functional status: Poor. Chest pain: None.  Reason for stopping exercise: Weakness and fatigue. Hypertensive response to exercise: Yes, peak blood pressure 200/80 mmHg. Exercise time 6 minutes 45 seconds on Bruce protocol, achieved 8.19 METS, and 84% of age-predicted maximum heart rate. Stress ECG non-diagnostic for ischemia - did not achieve 85%APMHR. Clinical correlation is required due to submaximal workload, poor functional capacity for age, hypertensive response to exercise, upsloping ST depressions noted on stress ECG recovered after 3 minutes into recovery. Consider further cardiac workup.  Heart Catheterization: None  14 day extended Holter monitor: Patch Wear Time: 11 days and 02 hours  Dominant rhythm normal sinus. Heart rate 49-192 bpm. Avg HR 72 bpm. No atrial fibrillation, supraventricular or ventricular tachycardia, high grade AV block, pauses (3 seconds or longer). Total ventricular ectopic burden <1%. Total supraventricular ectopic burden <1%. Patient triggered events: 8. These did not correlate with any significant dysrhythmias.  Coronary CTA 12/11/2020: 1. Total coronary calcium score of 0. 2. Normal coronary origin with right dominance. 3. Main pulmonary artery is dilated (32.6 mm) without obvious proximal filling defect. 4. CAD-RADS = 0. 5. No significant incidental noncardiac finding RECOMMENDATIONS: No evidence of CAD (0%). Consider non-atherosclerotic causes of chest pain.  LABORATORY DATA: CBC Latest Ref Rng & Units 06/30/2019 08/13/2016 01/01/2014  WBC 3.4 - 10.8 x10E3/uL 3.9 - 4.5  Hemoglobin 13.0 -  17.7 g/dL 16.4 16.0 15.1  Hematocrit 37.5 - 51.0 % 49.0 47.0 43.1  Platelets 150 - 450 x10E3/uL 255 - 206    CMP Latest Ref Rng & Units 12/07/2020 10/16/2020 06/30/2019  Glucose 65 - 99 mg/dL 99 97 93  BUN 6 - 24  mg/dL 18 13 12   Creatinine 0.76 - 1.27 mg/dL 1.43(H) 1.22 1.33(H)  Sodium 134 - 144 mmol/L 143 143 145(H)  Potassium 3.5 - 5.2 mmol/L 3.9 3.8 4.3  Chloride 96 - 106 mmol/L 102 103 106  CO2 20 - 29 mmol/L 25 26 -  Calcium 8.7 - 10.2 mg/dL 9.7 9.5 9.4  Total Protein 6.0 - 8.5 g/dL - - 6.9  Total Bilirubin 0.0 - 1.2 mg/dL - - 0.5  Alkaline Phos 39 - 117 IU/L - - 51  AST 0 - 40 IU/L - - 33  ALT 0 - 44 IU/L - - 41    Lipid Panel     Component Value Date/Time   CHOL 187 06/30/2019 0830   TRIG 71 06/30/2019 0830   HDL 53 06/30/2019 0830   CHOLHDL 3.5 06/30/2019 0830   LDLCALC 121 (H) 06/30/2019 0830   LABVLDL 13 06/30/2019 0830    No components found for: NTPROBNP No results for input(s): PROBNP in the last 8760 hours. No results for input(s): TSH in the last 8760 hours.  BMP Recent Labs    10/16/20 0723 12/07/20 0718  NA 143 143  K 3.8 3.9  CL 103 102  CO2 26 25  GLUCOSE 97 99  BUN 13 18  CREATININE 1.22 1.43*  CALCIUM 9.5 9.7    HEMOGLOBIN A1C No results found for: HGBA1C, MPG  External Labs:  Date Collected: 05/02/2020 , information obtained by primary care provider Potassium: 4.1 Creatinine 1.39 mg/dL. eGFR: 73 mL/min per 1.73 m Hemoglobin: 15.2 g/dL and hematocrit: 45.2 % Lipid profile: Total cholesterol 186 , triglycerides 78 , HDL 41 , LDL 128 non-HDL 145 AST: 21 , ALT: 19 , alkaline phosphatase: 50  TSH: 1.30    IMPRESSION:    ICD-10-CM   1. Syncope and collapse  R55     2. Benign hypertension  I10     3. Mixed hyperlipidemia  E78.2     4. Encounter to discuss test results  Z71.2         RECOMMENDATIONS: Dominic Joseph is a 40 y.o. male whose past medical history and cardiac risk factors include:  hypertension ,sickle cell trait, lung sarcoidosis (MRI and biopsy diagnosed at age 32), hyperlipidemia, obesity due to excess calories.  Syncope: Most likely secondary to vasovagal etiology. Orthostatic vital signs negative. EKG negative for  ischemia. Echo cardiogram notes preserved LVEF, normal diastolic, no significant valvular heart disease Exercise treadmill stress test: Submaximal workload, poor functional capacity for age, hypertensive response to exercise 14-day extended Holter monitor: Overall normal sinus rhythm without dysrhythmias. Coronary CTA: CAC 0, no evidence of CAD.  Benign essential hypertension: Improving. Prior to establishing care his SBP would range between 150-190 mmHg. Home blood pressures are improving patient states at home SBP range between 120-130 mmHg.  Hyperlipidemia: Currently started on statin therapy by his PCP. Tolerating the statin therapy well without any side effects or intolerances. Currently managed by primary care provider.  FINAL MEDICATION LIST END OF ENCOUNTER: No orders of the defined types were placed in this encounter.     Current Outpatient Medications:    amLODipine (NORVASC) 10 MG tablet, Take 10 mg by mouth every evening., Disp: ,  Rfl:    EPINEPHrine 0.3 mg/0.3 mL IJ SOAJ injection, Inject into the muscle., Disp: , Rfl:    fexofenadine (ALLEGRA) 180 MG tablet, Take 180 mg by mouth daily., Disp: , Rfl:    hydrochlorothiazide (HYDRODIURIL) 25 MG tablet, Take 1 tablet (25 mg total) by mouth every morning., Disp: 90 tablet, Rfl: 0   rosuvastatin (CRESTOR) 20 MG tablet, Take 20 mg by mouth daily., Disp: , Rfl:    Vortioxetine HBr (TRINTELLIX PO), Take 5 mg by mouth daily., Disp: , Rfl:   No orders of the defined types were placed in this encounter.   There are no Patient Instructions on file for this visit.   --Continue cardiac medications as reconciled in final medication list. --Return in about 1 year (around 12/28/2021) for Follow up, BP. Or sooner if needed. --Continue follow-up with your primary care physician regarding the management of your other chronic comorbid conditions.  Patient's questions and concerns were addressed to his satisfaction. He voices  understanding of the instructions provided during this encounter.   This note was created using a voice recognition software as a result there may be grammatical errors inadvertently enclosed that do not reflect the nature of this encounter. Every attempt is made to correct such errors.  Rex Kras, [place], [facility]  Pager: [phone] Office: [phone]

## 2021-01-03 ENCOUNTER — Other Ambulatory Visit: Payer: Self-pay | Admitting: Cardiology

## 2021-01-03 DIAGNOSIS — R69 Illness, unspecified: Secondary | ICD-10-CM | POA: Diagnosis not present

## 2021-01-03 DIAGNOSIS — I10 Essential (primary) hypertension: Secondary | ICD-10-CM

## 2021-01-10 ENCOUNTER — Other Ambulatory Visit: Payer: Self-pay

## 2021-01-10 ENCOUNTER — Ambulatory Visit
Admission: EM | Admit: 2021-01-10 | Discharge: 2021-01-10 | Disposition: A | Discharge status: Home / Self Care | Payer: 59 | Attending: Internal Medicine | Admitting: Internal Medicine

## 2021-01-10 ENCOUNTER — Ambulatory Visit (INDEPENDENT_AMBULATORY_CARE_PROVIDER_SITE_OTHER): Payer: 59

## 2021-01-10 ENCOUNTER — Encounter: Payer: Self-pay | Admitting: Emergency Medicine

## 2021-01-10 DIAGNOSIS — M25552 Pain in left hip: Secondary | ICD-10-CM | POA: Diagnosis not present

## 2021-01-10 DIAGNOSIS — M7062 Trochanteric bursitis, left hip: Secondary | ICD-10-CM

## 2021-01-10 DIAGNOSIS — M1612 Unilateral primary osteoarthritis, left hip: Secondary | ICD-10-CM | POA: Diagnosis not present

## 2021-01-10 MED ORDER — METHYLPREDNISOLONE 4 MG PO TBPK: ORAL_TABLET | ORAL | 0 refills | Status: DC

## 2021-01-10 MED ORDER — MELOXICAM 7.5 MG PO TABS: 7.5000 mg | ORAL_TABLET | Freq: Two times a day (BID) | ORAL | 0 refills | Status: DC

## 2021-01-10 NOTE — ED Provider Notes (Signed)
[facility]    CSN: [id number] Arrival date & time: 01/10/21  1051      History   Chief Complaint Chief Complaint  Patient presents with   Hip Pain    HPI Dominic Joseph is a 40 y.o. male who presents with L lateral hip pain x 6 weeks off and on but is getting worse. Has not injured himself at all. Has started doing his walking exercises around the time this started, but has not stepped into a hole or fallen. At times when he turns he feels his L gets weak and wants to give away and like he is almost going to fall. Sometimes feels L groin pain when he walks and his L leg gets weak. Denies back pain or past hx of sciatica. Denies paresthesia down his L leg.  He has not slept on his L side or laid on hard surface on his L side.     Past Medical History:  Diagnosis Date   Depression    Environmental allergies    Hyperlipidemia    Hypertension    Sarcoidosis of lung (HCC)    Syncope and collapse     Patient Active Problem List   Diagnosis Date Noted   Syncope and collapse    Cluster headaches 07/07/2019   HTN (hypertension) 07/07/2019   Fatigue 07/07/2019   ADD (attention deficit disorder) 07/07/2019   Depression 07/07/2019   IBS (irritable colon syndrome) 07/07/2019   Elevated serum creatinine 07/07/2019   Chronic pansinusitis 11/30/2018   Perennial allergic rhinitis 11/30/2018   Tobacco use disorder 01/29/2012   Environmental allergies 01/28/2012   Plantar fascial fibromatosis 12/23/2011   Asthma 07/25/2011   Sarcoidosis 07/25/2011   Pain in joint involving lower leg 06/10/2011   Primary localized osteoarthrosis, lower leg 06/10/2011    Past Surgical History:  Procedure Laterality Date   LUNG BIOPSY Left    SHOULDER SURGERY Right    WISDOM TOOTH EXTRACTION         Home Medications    Prior to Admission medications   Medication Sig Start Date End Date Taking? Authorizing Provider  meloxicam (MOBIC) 7.5 MG tablet Take 1 tablet (7.5 mg total)  by mouth 2 (two) times daily. 01/10/21  Yes Rodriguez-Southworth, Nettie Elm, PA-C  amLODipine (NORVASC) 10 MG tablet Take 10 mg by mouth every evening.    [provider]  EPINEPHrine 0.3 mg/0.3 mL IJ SOAJ injection Inject into the muscle. 07/08/12   [provider]  fexofenadine (ALLEGRA) 180 MG tablet Take 180 mg by mouth daily. 11/15/20   [provider]  hydrochlorothiazide (HYDRODIURIL) 25 MG tablet TAKE 1 TABLET BY MOUTH EVERY DAY IN THE MORNING 01/03/21   Tolia, Sunit, DO  rosuvastatin (CRESTOR) 20 MG tablet Take 20 mg by mouth daily. 10/01/20   [provider]  Vortioxetine HBr (TRINTELLIX PO) Take 5 mg by mouth daily.    [provider]  metoCLOPramide (REGLAN) 10 MG tablet Take 1 tablet (10 mg total) by mouth 2 (two) times daily as needed (headache). Patient not taking: Reported on 10/07/2016 01/02/14 06/04/19  Tomasita Crumble, MD    Family History Family History  Problem Relation Age of Onset   Hypertension Mother    Cardiomyopathy Father    Hypertension Sister    Hypertension Sister    Hypertension Brother    Cardiomyopathy Brother    Hyperlipidemia Maternal Grandmother    Hypertension Maternal Grandmother    Stroke Maternal Grandmother    Stroke Maternal Grandfather  Social History Social History   Tobacco Use   Smoking status: Some Days    Types: Cigars   Smokeless tobacco: Never  Vaping Use   Vaping Use: Never used  Substance Use Topics   Alcohol use: Yes    Comment: occ   Drug use: Not Currently    Types: Marijuana    Comment: in high school     Allergies   Patient has no known allergies.   Review of Systems Review of Systems  Musculoskeletal:  Positive for arthralgias and gait problem. Negative for back pain.  Skin:  Negative for rash.  Neurological:  Positive for weakness. Negative for numbness.       Denies paresthesia    Physical Exam Triage Vital Signs ED Triage Vitals  Enc Vitals Group     BP  01/10/21 1104 137/84     Pulse Rate 01/10/21 1104 67     Resp 01/10/21 1104 20     Temp 01/10/21 1104 98.3 F (36.8 C)     Temp Source 01/10/21 1104 Oral     SpO2 01/10/21 1104 100 %     Weight --      Height --      Head Circumference --      Peak Flow --      Pain Score 01/10/21 1105 8     Pain Loc --      Pain Edu? --      Excl. in GC? --    No data found.  Updated Vital Signs BP 137/84   Pulse 67   Temp 98.3 F (36.8 C) (Oral)   Resp 20   SpO2 100%   Visual Acuity Right Eye Distance:   Left Eye Distance:   Bilateral Distance:    Right Eye Near:   Left Eye Near:    Bilateral Near:     Physical Exam Constitutional:      General: He is not in acute distress.    Appearance: He is not toxic-appearing.  HENT:     Head: Normocephalic.     Right Ear: External ear normal.     Left Ear: External ear normal.  Eyes:     General: No scleral icterus.    Conjunctiva/sclera: Conjunctivae normal.  Pulmonary:     Effort: Pulmonary effort is normal.  Musculoskeletal:        General: Normal range of motion.     Cervical back: Neck supple.     Comments: L HIP- has equal leg length. Has local tenderness on lateral hip along the trochanter area and below iliac crest. ROM is normal, and internal rotation provoked lateral hip pain, but no groin pain with ROM. Strength 4.5/5, R strength 5/5  BACK- no local tenderness. ROM is normal, but trying to flex back or to L lateral area made him feel he may fall and his L leg felt weak.   Skin:    General: Skin is warm and dry.  Neurological:     Mental Status: He is alert and oriented to person, place, and time.     Gait: Gait normal.     Comments: Has negative SLR, and has hyporeflexia but are symmetric  Psychiatric:        Mood and Affect: Mood normal.        Behavior: Behavior normal.        Thought Content: Thought content normal.        Judgment: Judgment normal.     UC Treatments /  Results  Labs (all labs ordered are  listed, but only abnormal results are displayed) Labs Reviewed - No data to display  EKG   Radiology DG Hip Unilat With Pelvis 2-3 Views Left  Result Date: 01/10/2021 CLINICAL DATA:  Pain without known trauma EXAM: DG HIP (WITH OR WITHOUT PELVIS) 2-3V LEFT COMPARISON:  None. FINDINGS: There is no evidence of hip fracture or dislocation. Subchondral sclerosis and cystic change in the superior acetabulum consistent with early DJD. Left pelvic phleboliths. IMPRESSION: Early left hip DJD.  No fracture or dislocation Electronically Signed   By: Corlis Leak M.D.   On: 01/10/2021 12:26    Procedures Procedures (including critical care time)  Medications Ordered in UC Medications - No data to display  Initial Impression / Assessment and Plan / UC Course  I have reviewed the triage vital signs and the nursing notes. Pertinent  imaging results that were available during my care of the patient were reviewed by me and considered in my medical decision making (see chart for details). Has L trochanter bursitis and early L hip DJD. He declined medrol since makes him gain weight. I sent Mobic as noted.  Pt needs to FU with ortho if not improved in one week for work up.    Final Clinical Impressions(s) / UC Diagnoses   Final diagnoses:  Trochanteric bursitis of left hip  Primary osteoarthritis of left hip     Discharge Instructions      Follow up with your primary care or orthopedics if you gets worse, or symptoms do not improve.  Do not take Ibuprofen or Advil while on the prednisone. You may take Tylenol up to 1000 mg every 6 hours as needed for pain.      ED Prescriptions     Medication Sig Dispense Auth. Provider   methylPREDNISolone (MEDROL DOSEPAK) 4 MG TBPK tablet  (Status: Discontinued) Take as directed 21 tablet Rodriguez-Southworth, Nettie Elm, PA-C   meloxicam (MOBIC) 7.5 MG tablet Take 1 tablet (7.5 mg total) by mouth 2 (two) times daily. 14 tablet Rodriguez-Southworth, Nettie Elm,  PA-C      I have reviewed the PDMP during this encounter.   Garey Ham, PA-C 01/10/21 1242

## 2021-01-10 NOTE — ED Triage Notes (Signed)
Pt here with left hip pain x 6-8 weeks that is sharp and getting progressively worse recently.

## 2021-01-10 NOTE — Discharge Instructions (Addendum)
Follow up with your primary care or orthopedics if you gets worse, or symptoms do not improve.  Do not take Ibuprofen or Advil while on the prednisone. You may take Tylenol up to 1000 mg every 6 hours as needed for pain.

## 2021-01-17 DIAGNOSIS — M25552 Pain in left hip: Secondary | ICD-10-CM | POA: Diagnosis not present

## 2021-01-21 DIAGNOSIS — M25552 Pain in left hip: Secondary | ICD-10-CM | POA: Diagnosis not present

## 2021-04-07 ENCOUNTER — Other Ambulatory Visit: Payer: Self-pay | Admitting: Cardiology

## 2021-04-07 DIAGNOSIS — I10 Essential (primary) hypertension: Secondary | ICD-10-CM

## 2021-04-09 ENCOUNTER — Emergency Department (HOSPITAL_BASED_OUTPATIENT_CLINIC_OR_DEPARTMENT_OTHER): Payer: 59

## 2021-04-09 ENCOUNTER — Other Ambulatory Visit: Payer: Self-pay

## 2021-04-09 ENCOUNTER — Encounter (HOSPITAL_BASED_OUTPATIENT_CLINIC_OR_DEPARTMENT_OTHER): Payer: Self-pay

## 2021-04-09 ENCOUNTER — Emergency Department (HOSPITAL_BASED_OUTPATIENT_CLINIC_OR_DEPARTMENT_OTHER)
Admission: EM | Admit: 2021-04-09 | Discharge: 2021-04-09 | Disposition: A | Discharge status: Home / Self Care | Payer: 59 | Attending: Emergency Medicine | Admitting: Emergency Medicine

## 2021-04-09 DIAGNOSIS — M545 Low back pain, unspecified: Secondary | ICD-10-CM | POA: Insufficient documentation

## 2021-04-09 DIAGNOSIS — J45909 Unspecified asthma, uncomplicated: Secondary | ICD-10-CM | POA: Insufficient documentation

## 2021-04-09 DIAGNOSIS — D72819 Decreased white blood cell count, unspecified: Secondary | ICD-10-CM | POA: Diagnosis not present

## 2021-04-09 DIAGNOSIS — I10 Essential (primary) hypertension: Secondary | ICD-10-CM | POA: Diagnosis not present

## 2021-04-09 DIAGNOSIS — K529 Noninfective gastroenteritis and colitis, unspecified: Secondary | ICD-10-CM | POA: Diagnosis not present

## 2021-04-09 DIAGNOSIS — R197 Diarrhea, unspecified: Secondary | ICD-10-CM

## 2021-04-09 DIAGNOSIS — Z20822 Contact with and (suspected) exposure to covid-19: Secondary | ICD-10-CM | POA: Insufficient documentation

## 2021-04-09 DIAGNOSIS — Z79899 Other long term (current) drug therapy: Secondary | ICD-10-CM | POA: Diagnosis not present

## 2021-04-09 DIAGNOSIS — E876 Hypokalemia: Secondary | ICD-10-CM | POA: Insufficient documentation

## 2021-04-09 DIAGNOSIS — R1032 Left lower quadrant pain: Secondary | ICD-10-CM | POA: Diagnosis not present

## 2021-04-09 DIAGNOSIS — R109 Unspecified abdominal pain: Secondary | ICD-10-CM | POA: Diagnosis not present

## 2021-04-09 LAB — CBC
HCT: 43.5 % (ref 39.0–52.0)
Hemoglobin: 14.4 g/dL (ref 13.0–17.0)
MCH: 26.5 pg (ref 26.0–34.0)
MCHC: 33.1 g/dL (ref 30.0–36.0)
MCV: 80 fL (ref 80.0–100.0)
Platelets: 239 10*3/uL (ref 150–400)
RBC: 5.44 MIL/uL (ref 4.22–5.81)
RDW: 13.4 % (ref 11.5–15.5)
WBC: 3.8 10*3/uL — ABNORMAL LOW (ref 4.0–10.5)
nRBC: 0 % (ref 0.0–0.2)

## 2021-04-09 LAB — COMPREHENSIVE METABOLIC PANEL
ALT: 15 U/L (ref 0–44)
AST: 17 U/L (ref 15–41)
Albumin: 4.6 g/dL (ref 3.5–5.0)
Alkaline Phosphatase: 38 U/L (ref 38–126)
Anion gap: 8 (ref 5–15)
BUN: 13 mg/dL (ref 6–20)
CO2: 28 mmol/L (ref 22–32)
Calcium: 9.1 mg/dL (ref 8.9–10.3)
Chloride: 108 mmol/L (ref 98–111)
Creatinine, Ser: 1.27 mg/dL — ABNORMAL HIGH (ref 0.61–1.24)
GFR, Estimated: >60 mL/min (ref >60)
Glucose, Bld: 85 mg/dL (ref 70–99)
Potassium: 3.4 mmol/L — ABNORMAL LOW (ref 3.5–5.1)
Sodium: 144 mmol/L (ref 135–145)
Total Bilirubin: 0.5 mg/dL (ref 0.3–1.2)
Total Protein: 7.2 g/dL (ref 6.5–8.1)

## 2021-04-09 LAB — RESP PANEL BY RT-PCR (FLU A&B, COVID) ARPGX2
Influenza A by PCR: NEGATIVE
Influenza B by PCR: NEGATIVE
SARS Coronavirus 2 by RT PCR: NEGATIVE

## 2021-04-09 LAB — URINALYSIS, ROUTINE W REFLEX MICROSCOPIC
Bilirubin Urine: NEGATIVE
Glucose, UA: NEGATIVE mg/dL
Hgb urine dipstick: NEGATIVE
Ketones, ur: NEGATIVE mg/dL
Leukocytes,Ua: NEGATIVE
Nitrite: NEGATIVE
Protein, ur: 30 mg/dL — AB
Specific Gravity, Urine: 1.025 (ref 1.005–1.030)
pH: 6 (ref 5.0–8.0)

## 2021-04-09 LAB — LIPASE, BLOOD: Lipase: 44 U/L (ref 11–51)

## 2021-04-09 MED ORDER — IOHEXOL 300 MG/ML  SOLN
100.0000 mL | Freq: Once | INTRAMUSCULAR | Status: AC | PRN
  Administered 2021-04-09: 100 mL via INTRAVENOUS

## 2021-04-09 MED ORDER — SODIUM CHLORIDE 0.9 % IV BOLUS
1000.0000 mL | Freq: Once | INTRAVENOUS | Status: AC
  Administered 2021-04-09: 1000 mL via INTRAVENOUS

## 2021-04-09 MED ORDER — DICYCLOMINE HCL 10 MG PO CAPS
10.0000 mg | ORAL_CAPSULE | Freq: Once | ORAL | Status: AC
  Administered 2021-04-09: 10 mg via ORAL
  Filled 2021-04-09: qty 1

## 2021-04-09 NOTE — ED Provider Notes (Signed)
[facility] [facility] EMERGENCY DEPT Provider Note   CSN: [id number] Arrival date & time: 04/09/21  1348     History  Chief Complaint  Patient presents with   Abdominal Pain    Dominic Joseph is a 41 y.o. male.  Past medical history includes asthma, sarcoidosis, hypertension, hyperlipidemia.  Patient presents to the emergency department with 4 days of diarrhea.  He has associated lower abdominal pain as well.  He typically has 6-8 episodes of diarrhea a day. it is watery and nonbloody.  He denies nausea or vomiting.  Abdominal pain is in the lower abdomen and left lower quadrant and radiates to his left lower back.  It has been gradually worsening.  Patient's not tolerating p.o. intake, he states everything he eats goes right through him.  He denies any dysuria or hematuria.  He denies fevers.  Has not been around anybody else has been sick.   Abdominal Pain Associated symptoms: diarrhea   Associated symptoms: no dysuria, no fever, no hematuria, no nausea and no vomiting       Home Medications Prior to Admission medications   Medication Sig Start Date End Date Taking? Authorizing Provider  amLODipine (NORVASC) 10 MG tablet Take 10 mg by mouth every evening.   Yes [provider]  fexofenadine (ALLEGRA) 180 MG tablet Take 180 mg by mouth daily. 11/15/20  Yes [provider]  hydrochlorothiazide (HYDRODIURIL) 25 MG tablet TAKE 1 TABLET BY MOUTH EVERY DAY IN THE MORNING 04/08/21  Yes Tolia, Sunit, DO  rosuvastatin (CRESTOR) 20 MG tablet Take 20 mg by mouth daily. 10/01/20  Yes [provider]  Vortioxetine HBr (TRINTELLIX PO) Take 5 mg by mouth daily.   Yes [provider]  EPINEPHrine 0.3 mg/0.3 mL IJ SOAJ injection Inject into the muscle. 07/08/12   [provider]  meloxicam (MOBIC) 7.5 MG tablet Take 1 tablet (7.5 mg total) by mouth 2 (two) times daily. Patient not taking: Reported on 04/09/2021 01/10/21   Rodriguez-Southworth, Sunday Spillers,  PA-C  metoCLOPramide (REGLAN) 10 MG tablet Take 1 tablet (10 mg total) by mouth 2 (two) times daily as needed (headache). Patient not taking: Reported on 10/07/2016 01/02/14 06/04/19  Everlene Balls, MD      Allergies    Patient has no known allergies.    Review of Systems   Review of Systems  Constitutional:  Negative for fever.  Gastrointestinal:  Positive for abdominal pain and diarrhea. Negative for abdominal distention, blood in stool, nausea and vomiting.  Genitourinary:  Negative for dysuria and hematuria.  Musculoskeletal:  Positive for back pain.  All other systems reviewed and are negative.  Physical Exam Updated Vital Signs BP 132/85    Pulse 80    Temp 98.1 F (36.7 C)    Resp 16    Ht 6\' 3"  (1.905 m)    Wt 127 kg    SpO2 98%    BMI 35.00 kg/m  Physical Exam Vitals and nursing note reviewed.  Constitutional:      General: He is not in acute distress.    Appearance: Normal appearance. He is not ill-appearing, toxic-appearing or diaphoretic.  HENT:     Head: Normocephalic and atraumatic.     Nose: No nasal deformity.     Mouth/Throat:     Lips: Pink. No lesions.     Mouth: No injury, lacerations, oral lesions or angioedema.     Pharynx: Uvula midline. No uvula swelling.  Eyes:     General: Gaze aligned appropriately. No  scleral icterus.       Right eye: No discharge.        Left eye: No discharge.     Conjunctiva/sclera: Conjunctivae normal.     Right eye: Right conjunctiva is not injected. No exudate or hemorrhage.    Left eye: Left conjunctiva is not injected. No exudate or hemorrhage. Cardiovascular:     Rate and Rhythm: Normal rate and regular rhythm.     Pulses: Normal pulses.          Radial pulses are 2+ on the right side and 2+ on the left side.       Dorsalis pedis pulses are 2+ on the right side and 2+ on the left side.     Heart sounds: Normal heart sounds, S1 normal and S2 normal. Heart sounds not distant. No murmur heard.   No friction rub. No gallop.  No S3 or S4 sounds.  Pulmonary:     Effort: Pulmonary effort is normal. No accessory muscle usage or respiratory distress.     Breath sounds: Normal breath sounds. No stridor. No wheezing, rhonchi or rales.  Chest:     Chest wall: No tenderness.  Abdominal:     General: Abdomen is flat. Bowel sounds are normal. There is no distension.     Palpations: Abdomen is soft. There is no mass or pulsatile mass.     Tenderness: There is abdominal tenderness in the suprapubic area and left lower quadrant. There is left CVA tenderness. There is no guarding or rebound.     Comments: Pretty significant abdominal tenderness in the suprapubic and left lower quadrant area.  There is left CVA tenderness.  Patient does have some guarding present on exam.  Musculoskeletal:     Right lower leg: No edema.     Left lower leg: No edema.  Skin:    General: Skin is warm and dry.     Coloration: Skin is not jaundiced or pale.     Findings: No bruising, erythema, lesion or rash.  Neurological:     General: No focal deficit present.     Mental Status: He is alert and oriented to person, place, and time.     GCS: GCS eye subscore is 4. GCS verbal subscore is 5. GCS motor subscore is 6.  Psychiatric:        Mood and Affect: Mood normal.        Behavior: Behavior normal. Behavior is cooperative.    ED Results / Procedures / Treatments   Labs (all labs ordered are listed, but only abnormal results are displayed) Labs Reviewed  COMPREHENSIVE METABOLIC PANEL - Abnormal; Notable for the following components:      Result Value   Potassium 3.4 (*)    Creatinine, Ser 1.27 (*)    All other components within normal limits  CBC - Abnormal; Notable for the following components:   WBC 3.8 (*)    All other components within normal limits  URINALYSIS, ROUTINE W REFLEX MICROSCOPIC - Abnormal; Notable for the following components:   Protein, ur 30 (*)    All other components within normal limits  RESP PANEL BY RT-PCR (FLU  A&B, COVID) ARPGX2  GASTROINTESTINAL PANEL BY PCR, STOOL (REPLACES STOOL CULTURE)  LIPASE, BLOOD    EKG None  Radiology CT Abdomen Pelvis W Contrast  Result Date: 04/09/2021 CLINICAL DATA:  Left lower quadrant abdominal pain. EXAM: CT ABDOMEN AND PELVIS WITH CONTRAST TECHNIQUE: Multidetector CT imaging of the abdomen and pelvis was performed  using the standard protocol following bolus administration of intravenous contrast. RADIATION DOSE REDUCTION: This exam was performed according to the departmental dose-optimization program which includes automated exposure control, adjustment of the mA and/or kV according to patient size and/or use of iterative reconstruction technique. CONTRAST:  OMNIPAQUE IOHEXOL 300 MG/ML  SOLN COMPARISON:  None. FINDINGS: Lower chest: No acute abnormality. Hepatobiliary: No focal liver abnormality is seen. No gallstones, gallbladder wall thickening, or biliary dilatation. Pancreas: Unremarkable. No pancreatic ductal dilatation or surrounding inflammatory changes. Spleen: Normal in size without focal abnormality. Adrenals/Urinary Tract: Adrenal glands are unremarkable. Kidneys are normal, without renal calculi, focal lesion, or hydronephrosis. Bladder is unremarkable. Stomach/Bowel: Moderate length segment small bowel wall thickening with mild surrounding inflammation in the right lower quadrant. No bowel obstruction, pneumatosis or free air. Appendix is within normal limits. There is also mild wall thickening of the proximal colon to the level of the mid transverse colon without surrounding inflammation. Stomach appears within normal limits. Vascular/Lymphatic: No significant vascular findings are present. No enlarged abdominal or pelvic lymph nodes. Reproductive: Prostate is unremarkable. Other: No abdominal wall hernia or abnormality. No abdominopelvic ascites. Musculoskeletal: No acute or significant osseous findings. IMPRESSION: 1. Mild wall thickening of the distal  ileum and proximal colon worrisome for nonspecific enterocolitis. No free air, pneumatosis or bowel obstruction. 2. Normal appendix. Electronically Signed   By: Darliss Cheney M.D.   On: 04/09/2021 17:46    Procedures Procedures   Medications Ordered in ED Medications  sodium chloride 0.9 % bolus 1,000 mL (0 mLs Intravenous Stopped 04/09/21 1759)  dicyclomine (BENTYL) capsule 10 mg (10 mg Oral Given 04/09/21 1704)  iohexol (OMNIPAQUE) 300 MG/ML solution 100 mL (100 mLs Intravenous Contrast Given 04/09/21 1725)    ED Course/ Medical Decision Making/ A&P                           Medical Decision Making Problems Addressed: Enterocolitis: acute illness or injury  Amount and/or Complexity of Data Reviewed External Data Reviewed: labs, radiology, ECG and notes. Labs: ordered. Decision-making details documented in ED Course. Radiology: ordered and independent interpretation performed. Decision-making details documented in ED Course. ECG/medicine tests: ordered and independent interpretation performed. Decision-making details documented in ED Course.  Risk Prescription drug management.   This is a 41 y.o. male with a PMH of asthma, sarcoidosis, hypertension, hyperlipidemia who presents to the ED with diarrhea for 4 days with associated abdominal pain.  Diarrhea is nonbloody and in large quantities throughout the day.   Vitals are stable. LLQ Abdominal pain is pretty significant.  Guarding present on exam. Plan to obtain CT scan of abdomen.  I personally reviewed all laboratory work and imaging. Abnormal results outlined below. CBC is with leukopenia with white blood cell count to 3.8, however this is very similar to prior.  CMP with mild hypokalemia to 3.4.  Creatinine is stable from baseline at 1.27.  Urinalysis is normal.  Lipase negative.  COVID and flu negative.  GI panel is pending.  CT abdomen pelvis does reveal enterocolitis of distal ileum and small colon.   Impression: Patient  reassessed, and feeling slightly better after IV fluids.  He was able to provide [place] a GI sample here in the ED.  I feel since he is not febrile, has no leukocytosis, is tolerating p.o. intake he should be able to be discharged home.  This is likely a viral enteritis.  He should follow-up with his PCP regarding  his GI pathogen panel in case he needs further antibiotics.  I have seen and evaluated this patient in conjunction with my attending physician who agrees and has made changes to the plan accordingly.  Portions of this note were generated with L[employer]. Dictation errors may occur despite best attempts at proofreading.  Final Clinical Impression(s) / ED Diagnoses Final diagnoses:  Enterocolitis  Diarrhea, unspecified type    Rx / DC Orders ED Discharge Orders     None         Adolphus Birchwood, PA-C 04/09/21 2230    Malvin Johns, MD 04/13/21 1501

## 2021-04-09 NOTE — ED Triage Notes (Signed)
Abd pain & diarrhea since Friday. States 6 episodes today. Denies n/v. States decreased appetite and states everything "runs right through him". No med intervention today. Left sided abd pain that radiates to back flank area

## 2021-04-09 NOTE — ED Notes (Signed)
EMT-P provided AVS using Teachback Method. Patient verbalizes understanding of Discharge Instructions. Opportunity for Questioning and Answers were provided by EMT-P. Patient Discharged from ED.  ? ?

## 2021-04-09 NOTE — Discharge Instructions (Signed)
You have been seen in the ED for diarrhea. Your CT image revealed enterocolitis which is an area of your bowel that is inflamed. We have sent off GI pathogen samples. You should follow up on these with MyChart. Please schedule an appointment with your pcp for an ED follow up visit.

## 2021-04-10 DIAGNOSIS — R300 Dysuria: Secondary | ICD-10-CM | POA: Diagnosis not present

## 2021-04-10 DIAGNOSIS — K529 Noninfective gastroenteritis and colitis, unspecified: Secondary | ICD-10-CM | POA: Diagnosis not present

## 2021-04-10 DIAGNOSIS — I10 Essential (primary) hypertension: Secondary | ICD-10-CM | POA: Diagnosis not present

## 2021-04-10 LAB — GASTROINTESTINAL PANEL BY PCR, STOOL (REPLACES STOOL CULTURE)

## 2021-04-26 DIAGNOSIS — H52223 Regular astigmatism, bilateral: Secondary | ICD-10-CM | POA: Diagnosis not present

## 2021-06-06 DIAGNOSIS — I10 Essential (primary) hypertension: Secondary | ICD-10-CM | POA: Diagnosis not present

## 2021-06-06 DIAGNOSIS — Z13220 Encounter for screening for lipoid disorders: Secondary | ICD-10-CM | POA: Diagnosis not present

## 2021-06-06 DIAGNOSIS — Z125 Encounter for screening for malignant neoplasm of prostate: Secondary | ICD-10-CM | POA: Diagnosis not present

## 2021-06-06 DIAGNOSIS — Z13228 Encounter for screening for other metabolic disorders: Secondary | ICD-10-CM | POA: Diagnosis not present

## 2021-06-06 DIAGNOSIS — Z0000 Encounter for general adult medical examination without abnormal findings: Secondary | ICD-10-CM | POA: Diagnosis not present

## 2021-06-14 ENCOUNTER — Telehealth (HOSPITAL_COMMUNITY): Payer: Self-pay

## 2021-06-14 NOTE — Telephone Encounter (Signed)
Attempted to contact patient to schedule OP MBS - left voicemail. ?

## 2021-06-17 ENCOUNTER — Other Ambulatory Visit (HOSPITAL_COMMUNITY): Payer: Self-pay

## 2021-06-17 DIAGNOSIS — R1310 Dysphagia, unspecified: Secondary | ICD-10-CM

## 2021-06-25 ENCOUNTER — Ambulatory Visit (HOSPITAL_COMMUNITY)
Admission: RE | Admit: 2021-06-25 | Discharge: 2021-06-25 | Disposition: A | Discharge status: Home / Self Care | Payer: 59 | Source: Ambulatory Visit | Attending: Family Medicine | Admitting: Family Medicine

## 2021-06-25 DIAGNOSIS — R1310 Dysphagia, unspecified: Secondary | ICD-10-CM | POA: Insufficient documentation

## 2021-06-25 NOTE — Therapy (Signed)
Modified Barium Swallow Progress Note ? ?Patient Details  ?Name: Dominic Joseph ?MRN: [id number] ?Date of Birth: 1981/01/31 ? ?Today's Date: 06/25/2021 ? ?Modified Barium Swallow completed.  Full report located under Chart Review in the Imaging Section. ? ?Brief recommendations include the following: ? ?Clinical Impression ? Patient presents with a normal oral phase of swallow and a very mild pharyngeal phase impairment. No incidents of penetration or aspiration observed with any of tested consistencies (thin, nectar, honey, puree, tablet). With nectar thick and honey thick liquids, patient exhibited swallow initiation delay to level of vallecular sinus and trace vallecular and pyriform sinus residuals post initial swallow (fully cleared with subsequent swallows) SLP observed prominent cricopharyngeal bar but this did not appear to significantly impact bolus transit with any of the tested consistencies. Esophageal sweep did not reveal anything significant however video quality was not optimal. SLP recommended to patient to f/u with PCP and may benefit from ENT consult.  ?  ?Swallow Evaluation Recommendations ? ? Recommended Consults: Consider ENT evaluation ? ? SLP Diet Recommendations: Regular solids;Thin liquid ? ? Liquid Administration via: Cup;Straw ? ? Medication Administration: Whole meds with liquid ? ?   ? ?   ? ? Postural Changes: Seated upright at 90 degrees ? ?   ? ?   ? ? ?Angela Nevin, MA, CCC-SLP ?Speech Therapy ? ?

## 2021-07-05 ENCOUNTER — Other Ambulatory Visit: Payer: Self-pay | Admitting: Cardiology

## 2021-07-05 DIAGNOSIS — I10 Essential (primary) hypertension: Secondary | ICD-10-CM

## 2021-09-02 DIAGNOSIS — M25512 Pain in left shoulder: Secondary | ICD-10-CM | POA: Diagnosis not present

## 2021-09-02 DIAGNOSIS — M542 Cervicalgia: Secondary | ICD-10-CM | POA: Diagnosis not present

## 2021-09-04 DIAGNOSIS — S43432D Superior glenoid labrum lesion of left shoulder, subsequent encounter: Secondary | ICD-10-CM | POA: Diagnosis not present

## 2021-09-04 DIAGNOSIS — M25612 Stiffness of left shoulder, not elsewhere classified: Secondary | ICD-10-CM | POA: Diagnosis not present

## 2021-09-04 DIAGNOSIS — M6281 Muscle weakness (generalized): Secondary | ICD-10-CM | POA: Diagnosis not present

## 2021-09-09 DIAGNOSIS — M6281 Muscle weakness (generalized): Secondary | ICD-10-CM | POA: Diagnosis not present

## 2021-09-09 DIAGNOSIS — M25612 Stiffness of left shoulder, not elsewhere classified: Secondary | ICD-10-CM | POA: Diagnosis not present

## 2021-09-09 DIAGNOSIS — S43432D Superior glenoid labrum lesion of left shoulder, subsequent encounter: Secondary | ICD-10-CM | POA: Diagnosis not present

## 2021-09-11 DIAGNOSIS — M6281 Muscle weakness (generalized): Secondary | ICD-10-CM | POA: Diagnosis not present

## 2021-09-11 DIAGNOSIS — S43432D Superior glenoid labrum lesion of left shoulder, subsequent encounter: Secondary | ICD-10-CM | POA: Diagnosis not present

## 2021-09-11 DIAGNOSIS — M25612 Stiffness of left shoulder, not elsewhere classified: Secondary | ICD-10-CM | POA: Diagnosis not present

## 2021-09-16 DIAGNOSIS — M25612 Stiffness of left shoulder, not elsewhere classified: Secondary | ICD-10-CM | POA: Diagnosis not present

## 2021-09-16 DIAGNOSIS — M6281 Muscle weakness (generalized): Secondary | ICD-10-CM | POA: Diagnosis not present

## 2021-09-16 DIAGNOSIS — S43432D Superior glenoid labrum lesion of left shoulder, subsequent encounter: Secondary | ICD-10-CM | POA: Diagnosis not present

## 2021-09-18 DIAGNOSIS — M25612 Stiffness of left shoulder, not elsewhere classified: Secondary | ICD-10-CM | POA: Diagnosis not present

## 2021-09-18 DIAGNOSIS — M6281 Muscle weakness (generalized): Secondary | ICD-10-CM | POA: Diagnosis not present

## 2021-09-18 DIAGNOSIS — S43432D Superior glenoid labrum lesion of left shoulder, subsequent encounter: Secondary | ICD-10-CM | POA: Diagnosis not present

## 2021-09-23 DIAGNOSIS — S43432D Superior glenoid labrum lesion of left shoulder, subsequent encounter: Secondary | ICD-10-CM | POA: Diagnosis not present

## 2021-10-02 DIAGNOSIS — M25512 Pain in left shoulder: Secondary | ICD-10-CM | POA: Diagnosis not present

## 2021-10-14 DIAGNOSIS — M25512 Pain in left shoulder: Secondary | ICD-10-CM | POA: Diagnosis not present

## 2021-11-27 DIAGNOSIS — X58XXXA Exposure to other specified factors, initial encounter: Secondary | ICD-10-CM | POA: Diagnosis not present

## 2021-11-27 DIAGNOSIS — G8918 Other acute postprocedural pain: Secondary | ICD-10-CM | POA: Diagnosis not present

## 2021-11-27 DIAGNOSIS — Y999 Unspecified external cause status: Secondary | ICD-10-CM | POA: Diagnosis not present

## 2021-11-27 DIAGNOSIS — M24112 Other articular cartilage disorders, left shoulder: Secondary | ICD-10-CM | POA: Diagnosis not present

## 2021-11-27 DIAGNOSIS — S43002A Unspecified subluxation of left shoulder joint, initial encounter: Secondary | ICD-10-CM | POA: Diagnosis not present

## 2021-11-27 DIAGNOSIS — M948X1 Other specified disorders of cartilage, shoulder: Secondary | ICD-10-CM | POA: Diagnosis not present

## 2021-11-27 DIAGNOSIS — M19012 Primary osteoarthritis, left shoulder: Secondary | ICD-10-CM | POA: Diagnosis not present

## 2021-11-27 DIAGNOSIS — S43432A Superior glenoid labrum lesion of left shoulder, initial encounter: Secondary | ICD-10-CM | POA: Diagnosis not present

## 2021-11-27 HISTORY — PX: SHOULDER SURGERY: SHX246

## 2021-11-29 DIAGNOSIS — M6281 Muscle weakness (generalized): Secondary | ICD-10-CM | POA: Diagnosis not present

## 2021-11-29 DIAGNOSIS — M25612 Stiffness of left shoulder, not elsewhere classified: Secondary | ICD-10-CM | POA: Diagnosis not present

## 2021-11-29 DIAGNOSIS — S43432D Superior glenoid labrum lesion of left shoulder, subsequent encounter: Secondary | ICD-10-CM | POA: Diagnosis not present

## 2021-12-03 DIAGNOSIS — M25612 Stiffness of left shoulder, not elsewhere classified: Secondary | ICD-10-CM | POA: Diagnosis not present

## 2021-12-03 DIAGNOSIS — S43432D Superior glenoid labrum lesion of left shoulder, subsequent encounter: Secondary | ICD-10-CM | POA: Diagnosis not present

## 2021-12-03 DIAGNOSIS — M6281 Muscle weakness (generalized): Secondary | ICD-10-CM | POA: Diagnosis not present

## 2021-12-06 DIAGNOSIS — M6281 Muscle weakness (generalized): Secondary | ICD-10-CM | POA: Diagnosis not present

## 2021-12-06 DIAGNOSIS — S43432D Superior glenoid labrum lesion of left shoulder, subsequent encounter: Secondary | ICD-10-CM | POA: Diagnosis not present

## 2021-12-06 DIAGNOSIS — M25612 Stiffness of left shoulder, not elsewhere classified: Secondary | ICD-10-CM | POA: Diagnosis not present

## 2021-12-09 DIAGNOSIS — M25612 Stiffness of left shoulder, not elsewhere classified: Secondary | ICD-10-CM | POA: Diagnosis not present

## 2021-12-09 DIAGNOSIS — M6281 Muscle weakness (generalized): Secondary | ICD-10-CM | POA: Diagnosis not present

## 2021-12-09 DIAGNOSIS — S43432D Superior glenoid labrum lesion of left shoulder, subsequent encounter: Secondary | ICD-10-CM | POA: Diagnosis not present

## 2021-12-11 DIAGNOSIS — S43432D Superior glenoid labrum lesion of left shoulder, subsequent encounter: Secondary | ICD-10-CM | POA: Diagnosis not present

## 2021-12-11 DIAGNOSIS — M6281 Muscle weakness (generalized): Secondary | ICD-10-CM | POA: Diagnosis not present

## 2021-12-11 DIAGNOSIS — M25612 Stiffness of left shoulder, not elsewhere classified: Secondary | ICD-10-CM | POA: Diagnosis not present

## 2021-12-12 DIAGNOSIS — E639 Nutritional deficiency, unspecified: Secondary | ICD-10-CM | POA: Diagnosis not present

## 2021-12-12 DIAGNOSIS — Z6835 Body mass index (BMI) 35.0-35.9, adult: Secondary | ICD-10-CM | POA: Diagnosis not present

## 2021-12-12 DIAGNOSIS — I10 Essential (primary) hypertension: Secondary | ICD-10-CM | POA: Diagnosis not present

## 2021-12-12 DIAGNOSIS — Z724 Inappropriate diet and eating habits: Secondary | ICD-10-CM | POA: Diagnosis not present

## 2021-12-12 DIAGNOSIS — E785 Hyperlipidemia, unspecified: Secondary | ICD-10-CM | POA: Diagnosis not present

## 2021-12-12 DIAGNOSIS — R944 Abnormal results of kidney function studies: Secondary | ICD-10-CM | POA: Diagnosis not present

## 2021-12-12 DIAGNOSIS — R69 Illness, unspecified: Secondary | ICD-10-CM | POA: Diagnosis not present

## 2021-12-12 DIAGNOSIS — Z713 Dietary counseling and surveillance: Secondary | ICD-10-CM | POA: Diagnosis not present

## 2021-12-12 DIAGNOSIS — E669 Obesity, unspecified: Secondary | ICD-10-CM | POA: Diagnosis not present

## 2021-12-12 DIAGNOSIS — M5489 Other dorsalgia: Secondary | ICD-10-CM | POA: Diagnosis not present

## 2021-12-12 DIAGNOSIS — M2550 Pain in unspecified joint: Secondary | ICD-10-CM | POA: Diagnosis not present

## 2021-12-12 DIAGNOSIS — E663 Overweight: Secondary | ICD-10-CM | POA: Diagnosis not present

## 2021-12-16 DIAGNOSIS — M6281 Muscle weakness (generalized): Secondary | ICD-10-CM | POA: Diagnosis not present

## 2021-12-16 DIAGNOSIS — M25612 Stiffness of left shoulder, not elsewhere classified: Secondary | ICD-10-CM | POA: Diagnosis not present

## 2021-12-16 DIAGNOSIS — S43432D Superior glenoid labrum lesion of left shoulder, subsequent encounter: Secondary | ICD-10-CM | POA: Diagnosis not present

## 2021-12-18 DIAGNOSIS — M6281 Muscle weakness (generalized): Secondary | ICD-10-CM | POA: Diagnosis not present

## 2021-12-18 DIAGNOSIS — S43432D Superior glenoid labrum lesion of left shoulder, subsequent encounter: Secondary | ICD-10-CM | POA: Diagnosis not present

## 2021-12-18 DIAGNOSIS — M25612 Stiffness of left shoulder, not elsewhere classified: Secondary | ICD-10-CM | POA: Diagnosis not present

## 2021-12-21 DIAGNOSIS — Z713 Dietary counseling and surveillance: Secondary | ICD-10-CM | POA: Diagnosis not present

## 2021-12-21 DIAGNOSIS — E669 Obesity, unspecified: Secondary | ICD-10-CM | POA: Diagnosis not present

## 2021-12-21 DIAGNOSIS — Z6835 Body mass index (BMI) 35.0-35.9, adult: Secondary | ICD-10-CM | POA: Diagnosis not present

## 2021-12-21 DIAGNOSIS — Z724 Inappropriate diet and eating habits: Secondary | ICD-10-CM | POA: Diagnosis not present

## 2021-12-21 DIAGNOSIS — E785 Hyperlipidemia, unspecified: Secondary | ICD-10-CM | POA: Diagnosis not present

## 2021-12-21 DIAGNOSIS — R944 Abnormal results of kidney function studies: Secondary | ICD-10-CM | POA: Diagnosis not present

## 2021-12-21 DIAGNOSIS — M2550 Pain in unspecified joint: Secondary | ICD-10-CM | POA: Diagnosis not present

## 2021-12-21 DIAGNOSIS — I10 Essential (primary) hypertension: Secondary | ICD-10-CM | POA: Diagnosis not present

## 2021-12-21 DIAGNOSIS — E663 Overweight: Secondary | ICD-10-CM | POA: Diagnosis not present

## 2021-12-21 DIAGNOSIS — M5489 Other dorsalgia: Secondary | ICD-10-CM | POA: Diagnosis not present

## 2021-12-21 DIAGNOSIS — Z723 Lack of physical exercise: Secondary | ICD-10-CM | POA: Diagnosis not present

## 2021-12-23 DIAGNOSIS — M25612 Stiffness of left shoulder, not elsewhere classified: Secondary | ICD-10-CM | POA: Diagnosis not present

## 2021-12-23 DIAGNOSIS — S43432D Superior glenoid labrum lesion of left shoulder, subsequent encounter: Secondary | ICD-10-CM | POA: Diagnosis not present

## 2021-12-23 DIAGNOSIS — M6281 Muscle weakness (generalized): Secondary | ICD-10-CM | POA: Diagnosis not present

## 2021-12-28 DIAGNOSIS — Z6834 Body mass index (BMI) 34.0-34.9, adult: Secondary | ICD-10-CM | POA: Diagnosis not present

## 2021-12-28 DIAGNOSIS — Z723 Lack of physical exercise: Secondary | ICD-10-CM | POA: Diagnosis not present

## 2021-12-28 DIAGNOSIS — E669 Obesity, unspecified: Secondary | ICD-10-CM | POA: Diagnosis not present

## 2021-12-28 DIAGNOSIS — M5489 Other dorsalgia: Secondary | ICD-10-CM | POA: Diagnosis not present

## 2021-12-28 DIAGNOSIS — Z713 Dietary counseling and surveillance: Secondary | ICD-10-CM | POA: Diagnosis not present

## 2021-12-28 DIAGNOSIS — E663 Overweight: Secondary | ICD-10-CM | POA: Diagnosis not present

## 2021-12-28 DIAGNOSIS — Z724 Inappropriate diet and eating habits: Secondary | ICD-10-CM | POA: Diagnosis not present

## 2021-12-28 DIAGNOSIS — E785 Hyperlipidemia, unspecified: Secondary | ICD-10-CM | POA: Diagnosis not present

## 2021-12-28 DIAGNOSIS — M2550 Pain in unspecified joint: Secondary | ICD-10-CM | POA: Diagnosis not present

## 2021-12-28 DIAGNOSIS — R944 Abnormal results of kidney function studies: Secondary | ICD-10-CM | POA: Diagnosis not present

## 2021-12-28 DIAGNOSIS — I10 Essential (primary) hypertension: Secondary | ICD-10-CM | POA: Diagnosis not present

## 2022-01-01 DIAGNOSIS — M6281 Muscle weakness (generalized): Secondary | ICD-10-CM | POA: Diagnosis not present

## 2022-01-01 DIAGNOSIS — M25612 Stiffness of left shoulder, not elsewhere classified: Secondary | ICD-10-CM | POA: Diagnosis not present

## 2022-01-01 DIAGNOSIS — S43432D Superior glenoid labrum lesion of left shoulder, subsequent encounter: Secondary | ICD-10-CM | POA: Diagnosis not present

## 2022-01-07 DIAGNOSIS — M6281 Muscle weakness (generalized): Secondary | ICD-10-CM | POA: Diagnosis not present

## 2022-01-07 DIAGNOSIS — M25612 Stiffness of left shoulder, not elsewhere classified: Secondary | ICD-10-CM | POA: Diagnosis not present

## 2022-01-07 DIAGNOSIS — S43432D Superior glenoid labrum lesion of left shoulder, subsequent encounter: Secondary | ICD-10-CM | POA: Diagnosis not present

## 2022-01-09 DIAGNOSIS — Z6834 Body mass index (BMI) 34.0-34.9, adult: Secondary | ICD-10-CM | POA: Diagnosis not present

## 2022-01-09 DIAGNOSIS — Z724 Inappropriate diet and eating habits: Secondary | ICD-10-CM | POA: Diagnosis not present

## 2022-01-09 DIAGNOSIS — M2550 Pain in unspecified joint: Secondary | ICD-10-CM | POA: Diagnosis not present

## 2022-01-09 DIAGNOSIS — Z723 Lack of physical exercise: Secondary | ICD-10-CM | POA: Diagnosis not present

## 2022-01-09 DIAGNOSIS — E669 Obesity, unspecified: Secondary | ICD-10-CM | POA: Diagnosis not present

## 2022-01-09 DIAGNOSIS — I10 Essential (primary) hypertension: Secondary | ICD-10-CM | POA: Diagnosis not present

## 2022-01-09 DIAGNOSIS — R944 Abnormal results of kidney function studies: Secondary | ICD-10-CM | POA: Diagnosis not present

## 2022-01-09 DIAGNOSIS — M5489 Other dorsalgia: Secondary | ICD-10-CM | POA: Diagnosis not present

## 2022-01-09 DIAGNOSIS — E785 Hyperlipidemia, unspecified: Secondary | ICD-10-CM | POA: Diagnosis not present

## 2022-01-09 DIAGNOSIS — Z713 Dietary counseling and surveillance: Secondary | ICD-10-CM | POA: Diagnosis not present

## 2022-01-14 ENCOUNTER — Other Ambulatory Visit: Payer: Self-pay

## 2022-01-14 NOTE — Progress Notes (Signed)
Completed random uds, results pending with [facility].  Etoh cleared.

## 2022-01-16 DIAGNOSIS — S43432D Superior glenoid labrum lesion of left shoulder, subsequent encounter: Secondary | ICD-10-CM | POA: Diagnosis not present

## 2022-01-16 DIAGNOSIS — M25612 Stiffness of left shoulder, not elsewhere classified: Secondary | ICD-10-CM | POA: Diagnosis not present

## 2022-01-16 DIAGNOSIS — M6281 Muscle weakness (generalized): Secondary | ICD-10-CM | POA: Diagnosis not present

## 2022-01-18 DIAGNOSIS — M5489 Other dorsalgia: Secondary | ICD-10-CM | POA: Diagnosis not present

## 2022-01-18 DIAGNOSIS — E669 Obesity, unspecified: Secondary | ICD-10-CM | POA: Diagnosis not present

## 2022-01-18 DIAGNOSIS — R944 Abnormal results of kidney function studies: Secondary | ICD-10-CM | POA: Diagnosis not present

## 2022-01-18 DIAGNOSIS — Z724 Inappropriate diet and eating habits: Secondary | ICD-10-CM | POA: Diagnosis not present

## 2022-01-18 DIAGNOSIS — Z723 Lack of physical exercise: Secondary | ICD-10-CM | POA: Diagnosis not present

## 2022-01-18 DIAGNOSIS — M2550 Pain in unspecified joint: Secondary | ICD-10-CM | POA: Diagnosis not present

## 2022-01-18 DIAGNOSIS — I10 Essential (primary) hypertension: Secondary | ICD-10-CM | POA: Diagnosis not present

## 2022-01-18 DIAGNOSIS — E785 Hyperlipidemia, unspecified: Secondary | ICD-10-CM | POA: Diagnosis not present

## 2022-01-18 DIAGNOSIS — Z713 Dietary counseling and surveillance: Secondary | ICD-10-CM | POA: Diagnosis not present

## 2022-01-18 DIAGNOSIS — Z6833 Body mass index (BMI) 33.0-33.9, adult: Secondary | ICD-10-CM | POA: Diagnosis not present

## 2022-01-21 DIAGNOSIS — S43432D Superior glenoid labrum lesion of left shoulder, subsequent encounter: Secondary | ICD-10-CM | POA: Diagnosis not present

## 2022-01-21 DIAGNOSIS — M6281 Muscle weakness (generalized): Secondary | ICD-10-CM | POA: Diagnosis not present

## 2022-01-21 DIAGNOSIS — M25612 Stiffness of left shoulder, not elsewhere classified: Secondary | ICD-10-CM | POA: Diagnosis not present

## 2022-01-25 DIAGNOSIS — M2550 Pain in unspecified joint: Secondary | ICD-10-CM | POA: Diagnosis not present

## 2022-01-25 DIAGNOSIS — M5489 Other dorsalgia: Secondary | ICD-10-CM | POA: Diagnosis not present

## 2022-01-25 DIAGNOSIS — Z713 Dietary counseling and surveillance: Secondary | ICD-10-CM | POA: Diagnosis not present

## 2022-01-25 DIAGNOSIS — R944 Abnormal results of kidney function studies: Secondary | ICD-10-CM | POA: Diagnosis not present

## 2022-01-25 DIAGNOSIS — Z723 Lack of physical exercise: Secondary | ICD-10-CM | POA: Diagnosis not present

## 2022-01-25 DIAGNOSIS — Z6833 Body mass index (BMI) 33.0-33.9, adult: Secondary | ICD-10-CM | POA: Diagnosis not present

## 2022-01-25 DIAGNOSIS — E669 Obesity, unspecified: Secondary | ICD-10-CM | POA: Diagnosis not present

## 2022-01-25 DIAGNOSIS — E785 Hyperlipidemia, unspecified: Secondary | ICD-10-CM | POA: Diagnosis not present

## 2022-01-25 DIAGNOSIS — I10 Essential (primary) hypertension: Secondary | ICD-10-CM | POA: Diagnosis not present

## 2022-01-25 DIAGNOSIS — Z724 Inappropriate diet and eating habits: Secondary | ICD-10-CM | POA: Diagnosis not present

## 2022-02-01 DIAGNOSIS — Z723 Lack of physical exercise: Secondary | ICD-10-CM | POA: Diagnosis not present

## 2022-02-01 DIAGNOSIS — Z724 Inappropriate diet and eating habits: Secondary | ICD-10-CM | POA: Diagnosis not present

## 2022-02-01 DIAGNOSIS — E785 Hyperlipidemia, unspecified: Secondary | ICD-10-CM | POA: Diagnosis not present

## 2022-02-01 DIAGNOSIS — E669 Obesity, unspecified: Secondary | ICD-10-CM | POA: Diagnosis not present

## 2022-02-01 DIAGNOSIS — R944 Abnormal results of kidney function studies: Secondary | ICD-10-CM | POA: Diagnosis not present

## 2022-02-01 DIAGNOSIS — Z6833 Body mass index (BMI) 33.0-33.9, adult: Secondary | ICD-10-CM | POA: Diagnosis not present

## 2022-02-01 DIAGNOSIS — M2550 Pain in unspecified joint: Secondary | ICD-10-CM | POA: Diagnosis not present

## 2022-02-01 DIAGNOSIS — Z713 Dietary counseling and surveillance: Secondary | ICD-10-CM | POA: Diagnosis not present

## 2022-02-01 DIAGNOSIS — I10 Essential (primary) hypertension: Secondary | ICD-10-CM | POA: Diagnosis not present

## 2022-02-01 DIAGNOSIS — M5489 Other dorsalgia: Secondary | ICD-10-CM | POA: Diagnosis not present

## 2022-02-04 DIAGNOSIS — S43432D Superior glenoid labrum lesion of left shoulder, subsequent encounter: Secondary | ICD-10-CM | POA: Diagnosis not present

## 2022-02-04 DIAGNOSIS — M6281 Muscle weakness (generalized): Secondary | ICD-10-CM | POA: Diagnosis not present

## 2022-02-04 DIAGNOSIS — M25612 Stiffness of left shoulder, not elsewhere classified: Secondary | ICD-10-CM | POA: Diagnosis not present

## 2022-02-15 DIAGNOSIS — R944 Abnormal results of kidney function studies: Secondary | ICD-10-CM | POA: Diagnosis not present

## 2022-02-15 DIAGNOSIS — E669 Obesity, unspecified: Secondary | ICD-10-CM | POA: Diagnosis not present

## 2022-02-15 DIAGNOSIS — M5489 Other dorsalgia: Secondary | ICD-10-CM | POA: Diagnosis not present

## 2022-02-15 DIAGNOSIS — Z6832 Body mass index (BMI) 32.0-32.9, adult: Secondary | ICD-10-CM | POA: Diagnosis not present

## 2022-02-15 DIAGNOSIS — E785 Hyperlipidemia, unspecified: Secondary | ICD-10-CM | POA: Diagnosis not present

## 2022-02-15 DIAGNOSIS — Z724 Inappropriate diet and eating habits: Secondary | ICD-10-CM | POA: Diagnosis not present

## 2022-02-15 DIAGNOSIS — M2550 Pain in unspecified joint: Secondary | ICD-10-CM | POA: Diagnosis not present

## 2022-02-15 DIAGNOSIS — I10 Essential (primary) hypertension: Secondary | ICD-10-CM | POA: Diagnosis not present

## 2022-02-15 DIAGNOSIS — Z713 Dietary counseling and surveillance: Secondary | ICD-10-CM | POA: Diagnosis not present

## 2022-02-15 DIAGNOSIS — Z723 Lack of physical exercise: Secondary | ICD-10-CM | POA: Diagnosis not present

## 2022-02-18 DIAGNOSIS — S43432D Superior glenoid labrum lesion of left shoulder, subsequent encounter: Secondary | ICD-10-CM | POA: Diagnosis not present

## 2022-02-18 DIAGNOSIS — M25612 Stiffness of left shoulder, not elsewhere classified: Secondary | ICD-10-CM | POA: Diagnosis not present

## 2022-02-18 DIAGNOSIS — M6281 Muscle weakness (generalized): Secondary | ICD-10-CM | POA: Diagnosis not present

## 2022-02-22 DIAGNOSIS — M2550 Pain in unspecified joint: Secondary | ICD-10-CM | POA: Diagnosis not present

## 2022-02-22 DIAGNOSIS — Z723 Lack of physical exercise: Secondary | ICD-10-CM | POA: Diagnosis not present

## 2022-02-22 DIAGNOSIS — E785 Hyperlipidemia, unspecified: Secondary | ICD-10-CM | POA: Diagnosis not present

## 2022-02-22 DIAGNOSIS — E669 Obesity, unspecified: Secondary | ICD-10-CM | POA: Diagnosis not present

## 2022-02-22 DIAGNOSIS — Z724 Inappropriate diet and eating habits: Secondary | ICD-10-CM | POA: Diagnosis not present

## 2022-02-22 DIAGNOSIS — M5489 Other dorsalgia: Secondary | ICD-10-CM | POA: Diagnosis not present

## 2022-02-22 DIAGNOSIS — Z6832 Body mass index (BMI) 32.0-32.9, adult: Secondary | ICD-10-CM | POA: Diagnosis not present

## 2022-02-22 DIAGNOSIS — I10 Essential (primary) hypertension: Secondary | ICD-10-CM | POA: Diagnosis not present

## 2022-02-22 DIAGNOSIS — R944 Abnormal results of kidney function studies: Secondary | ICD-10-CM | POA: Diagnosis not present

## 2022-02-22 DIAGNOSIS — Z713 Dietary counseling and surveillance: Secondary | ICD-10-CM | POA: Diagnosis not present

## 2022-02-24 DIAGNOSIS — M6281 Muscle weakness (generalized): Secondary | ICD-10-CM | POA: Diagnosis not present

## 2022-02-24 DIAGNOSIS — M25612 Stiffness of left shoulder, not elsewhere classified: Secondary | ICD-10-CM | POA: Diagnosis not present

## 2022-02-24 DIAGNOSIS — S43432D Superior glenoid labrum lesion of left shoulder, subsequent encounter: Secondary | ICD-10-CM | POA: Diagnosis not present

## 2022-03-01 DIAGNOSIS — Z723 Lack of physical exercise: Secondary | ICD-10-CM | POA: Diagnosis not present

## 2022-03-01 DIAGNOSIS — M2550 Pain in unspecified joint: Secondary | ICD-10-CM | POA: Diagnosis not present

## 2022-03-01 DIAGNOSIS — Z713 Dietary counseling and surveillance: Secondary | ICD-10-CM | POA: Diagnosis not present

## 2022-03-01 DIAGNOSIS — Z724 Inappropriate diet and eating habits: Secondary | ICD-10-CM | POA: Diagnosis not present

## 2022-03-01 DIAGNOSIS — E785 Hyperlipidemia, unspecified: Secondary | ICD-10-CM | POA: Diagnosis not present

## 2022-03-01 DIAGNOSIS — R944 Abnormal results of kidney function studies: Secondary | ICD-10-CM | POA: Diagnosis not present

## 2022-03-01 DIAGNOSIS — I10 Essential (primary) hypertension: Secondary | ICD-10-CM | POA: Diagnosis not present

## 2022-03-01 DIAGNOSIS — M5489 Other dorsalgia: Secondary | ICD-10-CM | POA: Diagnosis not present

## 2022-03-01 DIAGNOSIS — E669 Obesity, unspecified: Secondary | ICD-10-CM | POA: Diagnosis not present

## 2022-03-04 DIAGNOSIS — M6281 Muscle weakness (generalized): Secondary | ICD-10-CM | POA: Diagnosis not present

## 2022-03-04 DIAGNOSIS — M25612 Stiffness of left shoulder, not elsewhere classified: Secondary | ICD-10-CM | POA: Diagnosis not present

## 2022-03-04 DIAGNOSIS — S43432D Superior glenoid labrum lesion of left shoulder, subsequent encounter: Secondary | ICD-10-CM | POA: Diagnosis not present

## 2022-03-08 DIAGNOSIS — M2550 Pain in unspecified joint: Secondary | ICD-10-CM | POA: Diagnosis not present

## 2022-03-08 DIAGNOSIS — Z6832 Body mass index (BMI) 32.0-32.9, adult: Secondary | ICD-10-CM | POA: Diagnosis not present

## 2022-03-08 DIAGNOSIS — R944 Abnormal results of kidney function studies: Secondary | ICD-10-CM | POA: Diagnosis not present

## 2022-03-08 DIAGNOSIS — Z724 Inappropriate diet and eating habits: Secondary | ICD-10-CM | POA: Diagnosis not present

## 2022-03-08 DIAGNOSIS — Z713 Dietary counseling and surveillance: Secondary | ICD-10-CM | POA: Diagnosis not present

## 2022-03-08 DIAGNOSIS — I10 Essential (primary) hypertension: Secondary | ICD-10-CM | POA: Diagnosis not present

## 2022-03-08 DIAGNOSIS — Z723 Lack of physical exercise: Secondary | ICD-10-CM | POA: Diagnosis not present

## 2022-03-08 DIAGNOSIS — M5489 Other dorsalgia: Secondary | ICD-10-CM | POA: Diagnosis not present

## 2022-03-08 DIAGNOSIS — E785 Hyperlipidemia, unspecified: Secondary | ICD-10-CM | POA: Diagnosis not present

## 2022-03-08 DIAGNOSIS — E669 Obesity, unspecified: Secondary | ICD-10-CM | POA: Diagnosis not present

## 2022-03-13 DIAGNOSIS — S43432D Superior glenoid labrum lesion of left shoulder, subsequent encounter: Secondary | ICD-10-CM | POA: Diagnosis not present

## 2022-03-22 DIAGNOSIS — Z713 Dietary counseling and surveillance: Secondary | ICD-10-CM | POA: Diagnosis not present

## 2022-03-22 DIAGNOSIS — Z724 Inappropriate diet and eating habits: Secondary | ICD-10-CM | POA: Diagnosis not present

## 2022-03-22 DIAGNOSIS — E669 Obesity, unspecified: Secondary | ICD-10-CM | POA: Diagnosis not present

## 2022-03-22 DIAGNOSIS — M5489 Other dorsalgia: Secondary | ICD-10-CM | POA: Diagnosis not present

## 2022-03-22 DIAGNOSIS — M2550 Pain in unspecified joint: Secondary | ICD-10-CM | POA: Diagnosis not present

## 2022-03-22 DIAGNOSIS — I10 Essential (primary) hypertension: Secondary | ICD-10-CM | POA: Diagnosis not present

## 2022-03-22 DIAGNOSIS — Z6832 Body mass index (BMI) 32.0-32.9, adult: Secondary | ICD-10-CM | POA: Diagnosis not present

## 2022-03-22 DIAGNOSIS — E785 Hyperlipidemia, unspecified: Secondary | ICD-10-CM | POA: Diagnosis not present

## 2022-03-22 DIAGNOSIS — R944 Abnormal results of kidney function studies: Secondary | ICD-10-CM | POA: Diagnosis not present

## 2022-03-22 DIAGNOSIS — Z723 Lack of physical exercise: Secondary | ICD-10-CM | POA: Diagnosis not present

## 2022-03-27 ENCOUNTER — Encounter: Payer: Self-pay | Admitting: Physician Assistant

## 2022-03-27 ENCOUNTER — Ambulatory Visit: Payer: Self-pay | Admitting: Physician Assistant

## 2022-03-27 VITALS — BP 141/85 | HR 64 | Temp 98.4°F | Resp 12

## 2022-03-27 DIAGNOSIS — Z7689 Persons encountering health services in other specified circumstances: Secondary | ICD-10-CM

## 2022-03-27 NOTE — Progress Notes (Signed)
S/P surgery (11/27/21) on left shoulder.  OOW x1 month then has been on light duty since. 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 French Ana at [facility] released him to full duty on 03/29/24.  Wasn't able to get paper work from Dr. Alvester Morin office.  AMD

## 2022-03-27 NOTE — Progress Notes (Signed)
   Subjective: Return to work    Patient ID: Dominic Joseph, male    DOB: 1980/06/03, 42 y.o.   MRN: [id number]  HPI Patient request that cough may return to work release.  Patient has been released by orthopedics status post surgery to the left shoulder.  States he is ready for full duty.   Review of Systems Negative except for above complaint    Objective:   Physical Exam  Deferred      Assessment & Plan: Work status   Patient return back to full duty and will follow-up as necessary.

## 2022-03-29 DIAGNOSIS — E669 Obesity, unspecified: Secondary | ICD-10-CM | POA: Diagnosis not present

## 2022-03-29 DIAGNOSIS — E785 Hyperlipidemia, unspecified: Secondary | ICD-10-CM | POA: Diagnosis not present

## 2022-03-29 DIAGNOSIS — Z724 Inappropriate diet and eating habits: Secondary | ICD-10-CM | POA: Diagnosis not present

## 2022-03-29 DIAGNOSIS — I10 Essential (primary) hypertension: Secondary | ICD-10-CM | POA: Diagnosis not present

## 2022-03-29 DIAGNOSIS — M2550 Pain in unspecified joint: Secondary | ICD-10-CM | POA: Diagnosis not present

## 2022-03-29 DIAGNOSIS — R944 Abnormal results of kidney function studies: Secondary | ICD-10-CM | POA: Diagnosis not present

## 2022-03-29 DIAGNOSIS — Z723 Lack of physical exercise: Secondary | ICD-10-CM | POA: Diagnosis not present

## 2022-03-29 DIAGNOSIS — Z713 Dietary counseling and surveillance: Secondary | ICD-10-CM | POA: Diagnosis not present

## 2022-03-29 DIAGNOSIS — Z6832 Body mass index (BMI) 32.0-32.9, adult: Secondary | ICD-10-CM | POA: Diagnosis not present

## 2022-03-29 DIAGNOSIS — M5489 Other dorsalgia: Secondary | ICD-10-CM | POA: Diagnosis not present

## 2022-04-05 DIAGNOSIS — Z724 Inappropriate diet and eating habits: Secondary | ICD-10-CM | POA: Diagnosis not present

## 2022-04-05 DIAGNOSIS — M5489 Other dorsalgia: Secondary | ICD-10-CM | POA: Diagnosis not present

## 2022-04-05 DIAGNOSIS — E669 Obesity, unspecified: Secondary | ICD-10-CM | POA: Diagnosis not present

## 2022-04-05 DIAGNOSIS — Z6832 Body mass index (BMI) 32.0-32.9, adult: Secondary | ICD-10-CM | POA: Diagnosis not present

## 2022-04-05 DIAGNOSIS — M2550 Pain in unspecified joint: Secondary | ICD-10-CM | POA: Diagnosis not present

## 2022-04-05 DIAGNOSIS — R944 Abnormal results of kidney function studies: Secondary | ICD-10-CM | POA: Diagnosis not present

## 2022-04-05 DIAGNOSIS — Z713 Dietary counseling and surveillance: Secondary | ICD-10-CM | POA: Diagnosis not present

## 2022-04-05 DIAGNOSIS — I10 Essential (primary) hypertension: Secondary | ICD-10-CM | POA: Diagnosis not present

## 2022-04-05 DIAGNOSIS — E785 Hyperlipidemia, unspecified: Secondary | ICD-10-CM | POA: Diagnosis not present

## 2022-04-05 DIAGNOSIS — Z723 Lack of physical exercise: Secondary | ICD-10-CM | POA: Diagnosis not present

## 2022-04-12 DIAGNOSIS — I10 Essential (primary) hypertension: Secondary | ICD-10-CM | POA: Diagnosis not present

## 2022-04-12 DIAGNOSIS — Z713 Dietary counseling and surveillance: Secondary | ICD-10-CM | POA: Diagnosis not present

## 2022-04-12 DIAGNOSIS — M5489 Other dorsalgia: Secondary | ICD-10-CM | POA: Diagnosis not present

## 2022-04-12 DIAGNOSIS — M2550 Pain in unspecified joint: Secondary | ICD-10-CM | POA: Diagnosis not present

## 2022-04-12 DIAGNOSIS — R944 Abnormal results of kidney function studies: Secondary | ICD-10-CM | POA: Diagnosis not present

## 2022-04-12 DIAGNOSIS — Z6832 Body mass index (BMI) 32.0-32.9, adult: Secondary | ICD-10-CM | POA: Diagnosis not present

## 2022-04-12 DIAGNOSIS — E785 Hyperlipidemia, unspecified: Secondary | ICD-10-CM | POA: Diagnosis not present

## 2022-04-12 DIAGNOSIS — E669 Obesity, unspecified: Secondary | ICD-10-CM | POA: Diagnosis not present

## 2022-04-12 DIAGNOSIS — Z724 Inappropriate diet and eating habits: Secondary | ICD-10-CM | POA: Diagnosis not present

## 2022-04-12 DIAGNOSIS — Z723 Lack of physical exercise: Secondary | ICD-10-CM | POA: Diagnosis not present

## 2022-04-19 DIAGNOSIS — Z713 Dietary counseling and surveillance: Secondary | ICD-10-CM | POA: Diagnosis not present

## 2022-04-19 DIAGNOSIS — Z6832 Body mass index (BMI) 32.0-32.9, adult: Secondary | ICD-10-CM | POA: Diagnosis not present

## 2022-04-19 DIAGNOSIS — Z723 Lack of physical exercise: Secondary | ICD-10-CM | POA: Diagnosis not present

## 2022-04-19 DIAGNOSIS — I10 Essential (primary) hypertension: Secondary | ICD-10-CM | POA: Diagnosis not present

## 2022-04-19 DIAGNOSIS — E785 Hyperlipidemia, unspecified: Secondary | ICD-10-CM | POA: Diagnosis not present

## 2022-04-19 DIAGNOSIS — Z724 Inappropriate diet and eating habits: Secondary | ICD-10-CM | POA: Diagnosis not present

## 2022-04-19 DIAGNOSIS — M2550 Pain in unspecified joint: Secondary | ICD-10-CM | POA: Diagnosis not present

## 2022-04-19 DIAGNOSIS — R944 Abnormal results of kidney function studies: Secondary | ICD-10-CM | POA: Diagnosis not present

## 2022-04-19 DIAGNOSIS — M5489 Other dorsalgia: Secondary | ICD-10-CM | POA: Diagnosis not present

## 2022-04-19 DIAGNOSIS — E669 Obesity, unspecified: Secondary | ICD-10-CM | POA: Diagnosis not present

## 2022-04-26 DIAGNOSIS — M2550 Pain in unspecified joint: Secondary | ICD-10-CM | POA: Diagnosis not present

## 2022-04-26 DIAGNOSIS — Z724 Inappropriate diet and eating habits: Secondary | ICD-10-CM | POA: Diagnosis not present

## 2022-04-26 DIAGNOSIS — M5489 Other dorsalgia: Secondary | ICD-10-CM | POA: Diagnosis not present

## 2022-04-26 DIAGNOSIS — Z713 Dietary counseling and surveillance: Secondary | ICD-10-CM | POA: Diagnosis not present

## 2022-04-26 DIAGNOSIS — I10 Essential (primary) hypertension: Secondary | ICD-10-CM | POA: Diagnosis not present

## 2022-04-26 DIAGNOSIS — Z723 Lack of physical exercise: Secondary | ICD-10-CM | POA: Diagnosis not present

## 2022-04-26 DIAGNOSIS — E785 Hyperlipidemia, unspecified: Secondary | ICD-10-CM | POA: Diagnosis not present

## 2022-04-26 DIAGNOSIS — E669 Obesity, unspecified: Secondary | ICD-10-CM | POA: Diagnosis not present

## 2022-04-26 DIAGNOSIS — R944 Abnormal results of kidney function studies: Secondary | ICD-10-CM | POA: Diagnosis not present

## 2022-04-26 DIAGNOSIS — Z6832 Body mass index (BMI) 32.0-32.9, adult: Secondary | ICD-10-CM | POA: Diagnosis not present

## 2022-04-29 ENCOUNTER — Ambulatory Visit
Admission: EM | Admit: 2022-04-29 | Discharge: 2022-04-29 | Disposition: A | Discharge status: Home / Self Care | Payer: 59

## 2022-04-29 DIAGNOSIS — J0100 Acute maxillary sinusitis, unspecified: Secondary | ICD-10-CM

## 2022-04-29 MED ORDER — AMOXICILLIN 875 MG PO TABS: 875.0000 mg | ORAL_TABLET | Freq: Two times a day (BID) | ORAL | 0 refills | Status: AC

## 2022-04-29 NOTE — ED Provider Notes (Signed)
Dominic Joseph    CSN: [id number] Arrival date & time: 04/29/22  1113      History   Chief Complaint Chief Complaint  Patient presents with   Nasal Congestion   Cough    HPI Dominic Joseph is a 42 y.o. male.  Patient presents with 1 week history of congestion, sinus pressure, headache, mild cough.  No fever, rash, sore throat, wheezing, shortness of breath, vomiting, diarrhea, or other symptoms.  Treatment at home with OTC sinus medication without relief.   His medical history includes asthma, allergies, hypertension, sarcoidosis.  The history is provided by the patient and medical records.    Past Medical History:  Diagnosis Date   Depression    Environmental allergies    Hyperlipidemia    Hypertension    Sarcoidosis of lung ([place])    Syncope and collapse     Patient Active Problem List   Diagnosis Date Noted   Syncope and collapse    Cluster headaches 07/07/2019   HTN (hypertension) 07/07/2019   Fatigue 07/07/2019   ADD (attention deficit disorder) 07/07/2019   Depression 07/07/2019   IBS (irritable colon syndrome) 07/07/2019   Elevated serum creatinine 07/07/2019   Chronic pansinusitis 11/30/2018   Perennial allergic rhinitis 11/30/2018   Tobacco use disorder 01/29/2012   Environmental allergies 01/28/2012   Plantar fascial fibromatosis 12/23/2011   Asthma 07/25/2011   Sarcoidosis 07/25/2011   Pain in joint involving lower leg 06/10/2011   Primary localized osteoarthrosis, lower leg 06/10/2011    Past Surgical History:  Procedure Laterality Date   LUNG BIOPSY Left    SHOULDER SURGERY Right    SHOULDER SURGERY Left 11/27/2021   WISDOM TOOTH EXTRACTION         Home Medications    Prior to Admission medications   Medication Sig Start Date End Date Taking? Authorizing Provider  amoxicillin (AMOXIL) 875 MG tablet Take 1 tablet (875 mg total) by mouth 2 (two) times daily for 10 days. 04/29/22 05/09/22 Yes Sharion Balloon, NP  phentermine (ADIPEX-P)  37.5 MG tablet Take 37.5 mg by mouth every morning. 04/07/22  Yes [provider]  Acetaminophen Extra Strength 500 MG CAPS Take 2 capsules by mouth every 8 (eight) hours. 11/27/21   [provider]  amLODipine (NORVASC) 10 MG tablet Take 10 mg by mouth every evening.    [provider]  EPINEPHrine 0.3 mg/0.3 mL IJ SOAJ injection Inject into the muscle. 07/08/12   [provider]  fexofenadine (ALLEGRA) 180 MG tablet Take 180 mg by mouth daily. Patient not taking: Reported on 03/27/2022 11/15/20   [provider]  hydrochlorothiazide (HYDRODIURIL) 25 MG tablet TAKE 1 TABLET BY MOUTH EVERY DAY IN THE MORNING 07/05/21   Tolia, Sunit, DO  Vortioxetine HBr (TRINTELLIX PO) Take 5 mg by mouth daily.    [provider]  metoCLOPramide (REGLAN) 10 MG tablet Take 1 tablet (10 mg total) by mouth 2 (two) times daily as needed (headache). Patient not taking: Reported on 10/07/2016 01/02/14 06/04/19  Everlene Balls, MD    Family History Family History  Problem Relation Age of Onset   Hypertension Mother    Cardiomyopathy Father    Hypertension Sister    Hypertension Sister    Hypertension Brother    Cardiomyopathy Brother    Hyperlipidemia Maternal Grandmother    Hypertension Maternal Grandmother    Stroke Maternal Grandmother    Stroke Maternal Grandfather     Social History Social History   Tobacco Use  Smoking status: Some Days    Types: Cigars   Smokeless tobacco: Never  Vaping Use   Vaping Use: Never used  Substance Use Topics   Alcohol use: Yes    Comment: occ   Drug use: Not Currently    Types: Marijuana    Comment: in high school     Allergies   Patient has no known allergies.   Review of Systems Review of Systems  Constitutional:  Negative for chills and fever.  HENT:  Positive for congestion, postnasal drip, rhinorrhea and sinus pressure. Negative for ear pain and sore throat.   Respiratory:  Positive for cough. Negative  for shortness of breath and wheezing.   Cardiovascular:  Negative for chest pain and palpitations.  Gastrointestinal:  Negative for diarrhea and vomiting.  Skin:  Negative for color change and rash.  All other systems reviewed and are negative.    Physical Exam Triage Vital Signs ED Triage Vitals  Enc Vitals Group     BP      Pulse      Resp      Temp      Temp src      SpO2      Weight      Height      Head Circumference      Peak Flow      Pain Score      Pain Loc      Pain Edu?      Excl. in [place]?    No data found.  Updated Vital Signs BP 130/88   Pulse 76   Temp 98 F (36.7 C)   Resp 18   SpO2 98%   Visual Acuity Right Eye Distance:   Left Eye Distance:   Bilateral Distance:    Right Eye Near:   Left Eye Near:    Bilateral Near:     Physical Exam Vitals and nursing note reviewed.  Constitutional:      General: He is not in acute distress.    Appearance: Normal appearance. He is well-developed. He is not ill-appearing.  HENT:     Right Ear: Tympanic membrane normal.     Left Ear: Tympanic membrane normal.     Nose: Congestion present.     Mouth/Throat:     Mouth: Mucous membranes are moist.     Pharynx: Oropharynx is clear.  Cardiovascular:     Rate and Rhythm: Normal rate and regular rhythm.     Heart sounds: Normal heart sounds.  Pulmonary:     Effort: Pulmonary effort is normal. No respiratory distress.     Breath sounds: Normal breath sounds.  Musculoskeletal:     Cervical back: Neck supple.  Skin:    General: Skin is warm and dry.  Neurological:     Mental Status: He is alert.  Psychiatric:        Mood and Affect: Mood normal.        Behavior: Behavior normal.      UC Treatments / Results  Labs (all labs ordered are listed, but only abnormal results are displayed) Labs Reviewed - No data to display  EKG   Radiology No results found.  Procedures Procedures (including critical care time)  Medications Ordered in  UC Medications - No data to display  Initial Impression / Assessment and Plan / UC Course  I have reviewed the triage vital signs and the nursing notes.  Pertinent labs & imaging results that were available during my care  of the patient were reviewed by me and considered in my medical decision making (see chart for details).    Acute sinusitis.  Patient has been symptomatic for 1 week and is not improving with OTC treatment.  Treating today with amoxicillin.  Discussed continued symptomatic treatment.  Education provided on sinusitis.  Instructed patient to follow up with his PCP if his symptoms are not improving.  He agrees to plan of care.    Final Clinical Impressions(s) / UC Diagnoses   Final diagnoses:  Acute non-recurrent maxillary sinusitis     Discharge Instructions      Take the amoxicillin as directed.  Follow up with your primary care provider if your symptoms are not improving.        ED Prescriptions     Medication Sig Dispense Auth. Provider   amoxicillin (AMOXIL) 875 MG tablet Take 1 tablet (875 mg total) by mouth 2 (two) times daily for 10 days. 20 tablet Sharion Balloon, NP      PDMP not reviewed this encounter.   Sharion Balloon, NP 04/29/22 [phone]

## 2022-04-29 NOTE — ED Triage Notes (Signed)
Patient to Urgent Care with complaints of post nasal drip, cough, headaches, and sinus pain/ pressure. Discolored nasal drainage. Denies any known fevers.   Symptoms started approx 1 week ago.

## 2022-04-29 NOTE — Discharge Instructions (Signed)
Take the amoxicillin as directed.  Follow up with your primary care provider if your symptoms are not improving.   ° ° °

## 2022-05-03 DIAGNOSIS — R944 Abnormal results of kidney function studies: Secondary | ICD-10-CM | POA: Diagnosis not present

## 2022-05-03 DIAGNOSIS — E669 Obesity, unspecified: Secondary | ICD-10-CM | POA: Diagnosis not present

## 2022-05-03 DIAGNOSIS — Z713 Dietary counseling and surveillance: Secondary | ICD-10-CM | POA: Diagnosis not present

## 2022-05-03 DIAGNOSIS — E785 Hyperlipidemia, unspecified: Secondary | ICD-10-CM | POA: Diagnosis not present

## 2022-05-03 DIAGNOSIS — I10 Essential (primary) hypertension: Secondary | ICD-10-CM | POA: Diagnosis not present

## 2022-05-03 DIAGNOSIS — Z723 Lack of physical exercise: Secondary | ICD-10-CM | POA: Diagnosis not present

## 2022-05-03 DIAGNOSIS — M2550 Pain in unspecified joint: Secondary | ICD-10-CM | POA: Diagnosis not present

## 2022-05-03 DIAGNOSIS — Z724 Inappropriate diet and eating habits: Secondary | ICD-10-CM | POA: Diagnosis not present

## 2022-05-03 DIAGNOSIS — M5489 Other dorsalgia: Secondary | ICD-10-CM | POA: Diagnosis not present

## 2022-05-03 DIAGNOSIS — Z6832 Body mass index (BMI) 32.0-32.9, adult: Secondary | ICD-10-CM | POA: Diagnosis not present

## 2022-05-10 DIAGNOSIS — E785 Hyperlipidemia, unspecified: Secondary | ICD-10-CM | POA: Diagnosis not present

## 2022-05-10 DIAGNOSIS — I10 Essential (primary) hypertension: Secondary | ICD-10-CM | POA: Diagnosis not present

## 2022-05-10 DIAGNOSIS — Z6831 Body mass index (BMI) 31.0-31.9, adult: Secondary | ICD-10-CM | POA: Diagnosis not present

## 2022-05-10 DIAGNOSIS — Z723 Lack of physical exercise: Secondary | ICD-10-CM | POA: Diagnosis not present

## 2022-05-10 DIAGNOSIS — E669 Obesity, unspecified: Secondary | ICD-10-CM | POA: Diagnosis not present

## 2022-05-10 DIAGNOSIS — Z724 Inappropriate diet and eating habits: Secondary | ICD-10-CM | POA: Diagnosis not present

## 2022-05-10 DIAGNOSIS — M5489 Other dorsalgia: Secondary | ICD-10-CM | POA: Diagnosis not present

## 2022-05-10 DIAGNOSIS — M2550 Pain in unspecified joint: Secondary | ICD-10-CM | POA: Diagnosis not present

## 2022-05-10 DIAGNOSIS — Z713 Dietary counseling and surveillance: Secondary | ICD-10-CM | POA: Diagnosis not present

## 2022-05-10 DIAGNOSIS — R944 Abnormal results of kidney function studies: Secondary | ICD-10-CM | POA: Diagnosis not present

## 2022-05-11 ENCOUNTER — Emergency Department (HOSPITAL_BASED_OUTPATIENT_CLINIC_OR_DEPARTMENT_OTHER)
Admission: EM | Admit: 2022-05-11 | Discharge: 2022-05-12 | Disposition: A | Discharge status: Home / Self Care | Payer: 59 | Attending: Emergency Medicine | Admitting: Emergency Medicine

## 2022-05-11 DIAGNOSIS — Z79899 Other long term (current) drug therapy: Secondary | ICD-10-CM | POA: Diagnosis not present

## 2022-05-11 DIAGNOSIS — I10 Essential (primary) hypertension: Secondary | ICD-10-CM | POA: Diagnosis not present

## 2022-05-11 DIAGNOSIS — Z23 Encounter for immunization: Secondary | ICD-10-CM | POA: Diagnosis not present

## 2022-05-11 DIAGNOSIS — S61411A Laceration without foreign body of right hand, initial encounter: Secondary | ICD-10-CM | POA: Insufficient documentation

## 2022-05-11 DIAGNOSIS — Y9389 Activity, other specified: Secondary | ICD-10-CM | POA: Insufficient documentation

## 2022-05-11 DIAGNOSIS — W268XXA Contact with other sharp object(s), not elsewhere classified, initial encounter: Secondary | ICD-10-CM | POA: Insufficient documentation

## 2022-05-11 DIAGNOSIS — S6991XA Unspecified injury of right wrist, hand and finger(s), initial encounter: Secondary | ICD-10-CM | POA: Diagnosis present

## 2022-05-11 MED ORDER — LIDOCAINE HCL (PF) 1 % IJ SOLN
10.0000 mL | Freq: Once | INTRAMUSCULAR | Status: AC
  Administered 2022-05-11: 5 mL via INTRADERMAL
  Filled 2022-05-11: qty 10

## 2022-05-11 MED ORDER — LIDOCAINE-EPINEPHRINE-TETRACAINE (LET) TOPICAL GEL
3.0000 mL | Freq: Once | TOPICAL | Status: AC
  Administered 2022-05-11: 3 mL via TOPICAL
  Filled 2022-05-11: qty 3

## 2022-05-11 MED ORDER — TETANUS-DIPHTH-ACELL PERTUSSIS 5-2.5-18.5 LF-MCG/0.5 IM SUSY
0.5000 mL | PREFILLED_SYRINGE | Freq: Once | INTRAMUSCULAR | Status: AC
  Administered 2022-05-11: 0.5 mL via INTRAMUSCULAR
  Filled 2022-05-11: qty 0.5

## 2022-05-11 NOTE — ED Provider Notes (Signed)
[facility] Provider Note   CSN: [id number] Arrival date & time: 05/11/22  2025     History Chief Complaint  Patient presents with   Laceration    R hand    Dominic Joseph is a 42 y.o. male with history of hypertension, hyperlipidemia, depression, presents the emergency room today for evaluation of right hand laceration.  Patient reports that he was moving a new stove he got and there is a piece of metal that cut his hand.  Reports his tetanus is around 7 years ago.  He denies any rusting to the metal or any pieces that could have broken off.    Laceration Associated symptoms: no fever        Home Medications Prior to Admission medications   Medication Sig Start Date End Date Taking? Authorizing Provider  Acetaminophen Extra Strength 500 MG CAPS Take 2 capsules by mouth every 8 (eight) hours. 11/27/21   [provider]  amLODipine (NORVASC) 10 MG tablet Take 10 mg by mouth every evening.    [provider]  EPINEPHrine 0.3 mg/0.3 mL IJ SOAJ injection Inject into the muscle. 07/08/12   [provider]  fexofenadine (ALLEGRA) 180 MG tablet Take 180 mg by mouth daily. Patient not taking: Reported on 03/27/2022 11/15/20   [provider]  hydrochlorothiazide (HYDRODIURIL) 25 MG tablet TAKE 1 TABLET BY MOUTH EVERY DAY IN THE MORNING 07/05/21   Tolia, Sunit, DO  phentermine (ADIPEX-P) 37.5 MG tablet Take 37.5 mg by mouth every morning. 04/07/22   [provider]  Vortioxetine HBr (TRINTELLIX PO) Take 5 mg by mouth daily.    [provider]  metoCLOPramide (REGLAN) 10 MG tablet Take 1 tablet (10 mg total) by mouth 2 (two) times daily as needed (headache). Patient not taking: Reported on 10/07/2016 01/02/14 06/04/19  Everlene Balls, MD      Allergies    Patient has no known allergies.    Review of Systems   Review of Systems  Constitutional:  Negative for chills and fever.  Musculoskeletal:   Positive for myalgias.  Skin:  Positive for wound.    Physical Exam Updated Vital Signs BP (!) 135/98   Pulse 64   Temp 98.3 F (36.8 C) (Oral)   Resp 13   SpO2 98%  Physical Exam Vitals and nursing note reviewed.  Constitutional:      General: He is not in acute distress.    Appearance: Normal appearance. He is not ill-appearing or toxic-appearing.  Eyes:     General: No scleral icterus. Pulmonary:     Effort: Pulmonary effort is normal. No respiratory distress.  Musculoskeletal:     Comments: Approxiamtely 5.5cm laceration to the palmar aspect of the right hand. Some adipose tissue visible. No fascia, tendon, or ligament visualized. The patient has full ROM of his fingers and thumb with flexion, extension, abduction, and adduction. Cap refill brsik. Palpable pulses. Compartments are soft. He does have some sensation difference surrounding the wound.   Skin:    General: Skin is dry.     Findings: No rash.  Neurological:     General: No focal deficit present.     Mental Status: He is alert. Mental status is at baseline.  Psychiatric:        Mood and Affect: Mood normal.     ED Results / Procedures / Treatments   Labs (all labs ordered are listed, but only abnormal results are displayed) Labs Reviewed - No  data to display  EKG None  Radiology No results found.  Procedures .Marland KitchenLaceration Repair  Date/Time: 05/12/2022 12:06 AM  Performed by: Sherrell Puller, PA-C Authorized by: Sherrell Puller, PA-C   Consent:    Consent obtained:  Verbal   Consent given by:  Patient   Risks, benefits, and alternatives were discussed: yes     Risks discussed:  Infection, pain, need for additional repair, retained foreign body, poor cosmetic result, tendon damage and poor wound healing   Alternatives discussed:  No treatment Universal protocol:    Procedure explained and questions answered to patient or proxy's satisfaction: yes     Patient identity confirmed:  Verbally with  patient Anesthesia:    Anesthesia method:  Topical application and local infiltration   Topical anesthetic:  LET   Local anesthetic:  Lidocaine 1% w/o epi Laceration details:    Location:  Hand   Hand location:  R palm   Length (cm):  5.5 Skin repair:    Repair method:  Sutures   Suture size:  4-0   Suture material:  Prolene   Suture technique:  Simple interrupted   Number of sutures:  10 Approximation:    Approximation:  Close Repair type:    Repair type:  Simple    Medications Ordered in ED Medications  Tdap (BOOSTRIX) injection 0.5 mL (0.5 mLs Intramuscular Given 05/11/22 2200)  lidocaine-EPINEPHrine-tetracaine (LET) topical gel (3 mLs Topical Given 05/11/22 2201)  lidocaine (PF) (XYLOCAINE) 1 % injection 10 mL (5 mLs Intradermal Given 05/11/22 2201)    ED Course/ Medical Decision Making/ A&P                           Medical Decision Making Risk Prescription drug management.   42 year old male presents the emergency room today for evaluation of right hand laceration.  Differential diagnosis includes but is not limited to laceration versus foreign body versus tendon ligament damage.  Vital signs are unremarkable.  Patient normotensive, afebrile, normal pulse rate, satting well on room air without increased work of breathing.  Physical exam as noted above.  Patient does not want an XR. I can see adipose tissue, does not appear too deep. Wound was irrigated and cleansed by nursing staff.   Please see procedure note.  Patient tolerated procedure well.  10 Prolene sutures were placed with a well-approximated wound.  Let was used as well as injectable lidocaine.  Wound was bandaged prior to dispo.  I do not think the patient needs to be placed on any antibiotics as the wound did not appear contaminated.  I looked into the wound and do not see any foreign bodies, patient still does not think he needs an x-ray.  We discussed laceration care and when to return for suture removal.  We  discussed return precautions red flag symptoms.  Patient verbalizes understanding and agrees with plan.  Patient is stable being discharged home in good condition.  Final Clinical Impression(s) / ED Diagnoses Final diagnoses:  Laceration of right palm, initial encounter    Rx / DC Orders ED Discharge Orders     None         Sherrell Puller, PA-C 05/12/22 [place], DO 05/12/22 2259

## 2022-05-11 NOTE — ED Triage Notes (Signed)
Pt states he was moving furniture (stove), slid hand on sharp metal ledge approx 68mn ago. States tetanus was 772yrago

## 2022-05-12 NOTE — Discharge Instructions (Addendum)
You were seen here today for evaluation of your hand laceration.  I am glad you are able to repair this for you.  You will need to return in 10 days for suture removal.  You can go to urgent care, primary care, or the ER for removal of these.  Please make sure you are cleaning the wound daily with Dial soap and water or if visibly soiled.  I would leave it covered for the next 48 to 72 hours.  You can also apply Vaseline or antibacterial ointment to it after cleaning.  I have included laceration care to this discharge paperwork.  Again, please make sure you are not washing dishes, soaking your hand in any bath water, dishwater, Jacuzzis, pools, etc.  Continue Tylenol/ibuprofen as needed for pain.  If you have any concerns for any worsening symptoms, please return to the nearest emergency room for evaluation.  Contact a doctor if: You got a tetanus shot and you have any of these problems where the needle went in: Swelling. Very bad pain. Redness. Bleeding. A wound that was closed breaks open. You have a fever. You have any of these signs of infection in your wound: More redness, swelling, or pain. Fluid or blood. Warmth. Pus or a bad smell. You see something coming out of the wound, such as wood or glass. Medicine does not make your pain go away. You notice a change in the color of your skin near your wound. You need to change the bandage often. You have a new rash. You lose feeling (have numbness) around the wound. Get help right away if: You have very bad swelling around the wound. Your pain suddenly gets worse and is very bad. You have painful lumps near the wound or on skin anywhere on your body. You have a red streak going away from your wound. The wound is on your hand or foot, and: You cannot move a finger or toe. Your fingers or toes look pale or bluish.

## 2022-05-17 DIAGNOSIS — E785 Hyperlipidemia, unspecified: Secondary | ICD-10-CM | POA: Diagnosis not present

## 2022-05-17 DIAGNOSIS — Z6831 Body mass index (BMI) 31.0-31.9, adult: Secondary | ICD-10-CM | POA: Diagnosis not present

## 2022-05-17 DIAGNOSIS — Z713 Dietary counseling and surveillance: Secondary | ICD-10-CM | POA: Diagnosis not present

## 2022-05-17 DIAGNOSIS — M5489 Other dorsalgia: Secondary | ICD-10-CM | POA: Diagnosis not present

## 2022-05-17 DIAGNOSIS — I10 Essential (primary) hypertension: Secondary | ICD-10-CM | POA: Diagnosis not present

## 2022-05-17 DIAGNOSIS — E669 Obesity, unspecified: Secondary | ICD-10-CM | POA: Diagnosis not present

## 2022-05-17 DIAGNOSIS — M2550 Pain in unspecified joint: Secondary | ICD-10-CM | POA: Diagnosis not present

## 2022-05-17 DIAGNOSIS — Z723 Lack of physical exercise: Secondary | ICD-10-CM | POA: Diagnosis not present

## 2022-05-17 DIAGNOSIS — Z724 Inappropriate diet and eating habits: Secondary | ICD-10-CM | POA: Diagnosis not present

## 2022-05-17 DIAGNOSIS — R944 Abnormal results of kidney function studies: Secondary | ICD-10-CM | POA: Diagnosis not present

## 2022-05-20 ENCOUNTER — Encounter: Payer: Self-pay | Admitting: Family Medicine

## 2022-05-20 ENCOUNTER — Ambulatory Visit (INDEPENDENT_AMBULATORY_CARE_PROVIDER_SITE_OTHER): Payer: 59 | Admitting: Family Medicine

## 2022-05-20 VITALS — BP 138/82 | HR 84 | Temp 98.8°F | Ht 75.0 in | Wt 255.2 lb

## 2022-05-20 DIAGNOSIS — N401 Enlarged prostate with lower urinary tract symptoms: Secondary | ICD-10-CM

## 2022-05-20 DIAGNOSIS — I10 Essential (primary) hypertension: Secondary | ICD-10-CM | POA: Diagnosis not present

## 2022-05-20 DIAGNOSIS — R3911 Hesitancy of micturition: Secondary | ICD-10-CM

## 2022-05-20 DIAGNOSIS — F322 Major depressive disorder, single episode, severe without psychotic features: Secondary | ICD-10-CM

## 2022-05-20 DIAGNOSIS — Z4802 Encounter for removal of sutures: Secondary | ICD-10-CM

## 2022-05-20 DIAGNOSIS — K5901 Slow transit constipation: Secondary | ICD-10-CM

## 2022-05-20 MED ORDER — AMLODIPINE BESYLATE 10 MG PO TABS: 10.0000 mg | ORAL_TABLET | Freq: Every evening | ORAL | 1 refills | Status: DC

## 2022-05-20 MED ORDER — DOCUSATE SODIUM 100 MG PO CAPS: 100.0000 mg | ORAL_CAPSULE | Freq: Two times a day (BID) | ORAL | 0 refills | Status: AC | PRN

## 2022-05-20 MED ORDER — POLYETHYLENE GLYCOL 3350 17 GM/SCOOP PO POWD: 17.0000 g | Freq: Two times a day (BID) | ORAL | 1 refills | Status: AC | PRN

## 2022-05-20 NOTE — Progress Notes (Signed)
New Patient Office Visit  Subjective    Patient ID: Dominic Joseph, male    DOB: 06/22/1980  Age: 42 y.o. MRN: [id number]  CC:  Chief Complaint  Patient presents with   Establish Care    NP/establish care concerns constipation and also feels like he has to force himself to urinate. BP fluctuates at time. Remove stitches on right hand placed 10 days ago. Currently in weight loss program.     HPI Dominic Joseph presents to establish care. Encounter Diagnoses  Name Primary?   Essential hypertension Yes   Visit for suture removal    Slow transit constipation    Benign prostatic hyperplasia with urinary hesitancy    Severe depression ([place])    For establishment of care and follow-up with some ongoing issues.  History of hypertension treated with amlodipine.  Status post laceration to palm of right hand 10 days ago.  Wound has done well.  There is no numbness or tingling distal to the wound.  No discharge streaking fever or chills.  History of irritable bowel with constipation.  Ongoing chronic constipation.  Status post GI workup.  Fortunately the cramps have mostly dissipated but he continues to deal with constipation.  He stools on a weekly basis.  He had tried Linzess in the past but it led to chronic diarrhea.  Followed by behavior health for severe depression.  There are.  Slowly feels as though things would be better if he were not here he strongly denies any intention of self-harm.  He is on a weight loss program has been taking phentermine for a month.  He is tolerating it well.  He is having issues with urination.  There is prevoid hesitancy subsequent urine flow is good.  He empties completely.  Occasional nocturia.  There has been some frequency.  History of prostate cancer with a maternal uncle.  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  Outpatient Encounter Medications as of 05/20/2022  Medication Sig   Acetaminophen Extra Strength 500 MG CAPS Take 2  capsules by mouth every 8 (eight) hours.   docusate sodium (COLACE) 100 MG capsule Take 1 capsule (100 mg total) by mouth 2 (two) times daily as needed for mild constipation.   EPINEPHrine 0.3 mg/0.3 mL IJ SOAJ injection Inject into the muscle.   fexofenadine (ALLEGRA) 180 MG tablet Take 180 mg by mouth daily.   hydrochlorothiazide (HYDRODIURIL) 25 MG tablet TAKE 1 TABLET BY MOUTH EVERY DAY IN THE MORNING   Multiple Vitamin (MULTI-VITAMIN) tablet Take by mouth.   Omega-3 Fatty Acids (FISH OIL) 300 MG CAPS Take by mouth.   phentermine (ADIPEX-P) 37.5 MG tablet Take 37.5 mg by mouth every morning.   polyethylene glycol powder (GLYCOLAX/MIRALAX) 17 GM/SCOOP powder Take 17 g by mouth 2 (two) times daily as needed.   Vortioxetine HBr (TRINTELLIX PO) Take 5 mg by mouth daily.   amLODipine (NORVASC) 10 MG tablet Take 1 tablet (10 mg total) by mouth every evening.   [DISCONTINUED] amLODipine (NORVASC) 10 MG tablet Take 10 mg by mouth every evening. (Patient not taking: Reported on 05/20/2022)   [DISCONTINUED] metoCLOPramide (REGLAN) 10 MG tablet Take 1 tablet (10 mg total) by mouth 2 (two) times daily as needed (headache). (Patient not taking: Reported on 10/07/2016)   No facility-administered encounter medications on file as of 05/20/2022.    Past Medical History:  Diagnosis Date   Depression    Environmental allergies    Hyperlipidemia  Hypertension    Sarcoidosis of lung ([place])    Syncope and collapse     Past Surgical History:  Procedure Laterality Date   LUNG BIOPSY Left    SHOULDER SURGERY Right    SHOULDER SURGERY Left 11/27/2021   WISDOM TOOTH EXTRACTION      Family History  Problem Relation Age of Onset   Hypertension Mother    Cardiomyopathy Father    Hypertension Sister    Hypertension Sister    Hypertension Brother    Cardiomyopathy Brother    Hyperlipidemia Maternal Grandmother    Hypertension Maternal Grandmother    Stroke Maternal Grandmother    Stroke Maternal  Grandfather     Social History   Socioeconomic History   Marital status: Married    Spouse name: Not on file   Number of children: 4   Years of education: Not on file   Highest education level: Not on file  Occupational History   Occupation: Engineer, structural  Tobacco Use   Smoking status: Some Days    Types: Cigars   Smokeless tobacco: Never  Vaping Use   Vaping Use: Never used  Substance and Sexual Activity   Alcohol use: Yes    Comment: occ   Drug use: Not Currently    Types: Marijuana    Comment: in high school   Sexual activity: Yes  Other Topics Concern   Not on file  Social History Narrative   Not on file   Social Determinants of Health   Financial Resource Strain: Not on file  Food Insecurity: Not on file  Transportation Needs: Not on file  Physical Activity: Not on file  Stress: Not on file  Social Connections: Not on file  Intimate Partner Violence: Not on file    Review of Systems  Constitutional: Negative.   HENT: Negative.    Eyes:  Negative for blurred vision, discharge and redness.  Respiratory: Negative.    Cardiovascular: Negative.   Gastrointestinal:  Positive for blood in stool. Negative for abdominal pain, constipation, melena, nausea and vomiting.  Genitourinary: Negative.   Musculoskeletal: Negative.  Negative for myalgias.  Skin:  Negative for rash.  Neurological:  Negative for tingling, loss of consciousness and weakness.  Endo/Heme/Allergies:  Negative for polydipsia.  Psychiatric/Behavioral:  Positive for depression. The patient is nervous/anxious.         Objective    BP 138/82 (BP Location: Left Arm, Patient Position: Sitting, Cuff Size: Large)   Pulse 84   Temp 98.8 F (37.1 C) (Temporal)   Ht '6\' 3"'$  (1.905 m)   Wt 255 lb 3.2 oz (115.8 kg)   SpO2 95%   BMI 31.90 kg/m   Physical Exam Constitutional:      General: He is not in acute distress.    Appearance: Normal appearance. He is not ill-appearing, toxic-appearing or  diaphoretic.  HENT:     Head: Normocephalic and atraumatic.     Right Ear: External ear normal.     Left Ear: External ear normal.  Eyes:     General: No scleral icterus.       Right eye: No discharge.        Left eye: No discharge.     Extraocular Movements: Extraocular movements intact.     Conjunctiva/sclera: Conjunctivae normal.  Pulmonary:     Effort: Pulmonary effort is normal. No respiratory distress.  Skin:    General: Skin is warm and dry.       Neurological:  Mental Status: He is alert and oriented to person, place, and time.  Psychiatric:        Mood and Affect: Mood normal.        Behavior: Behavior normal.         Assessment & Plan:   Essential hypertension -     amLODIPine Besylate; Take 1 tablet (10 mg total) by mouth every evening.  Dispense: 100 tablet; Refill: 1  Visit for suture removal  Slow transit constipation -     Docusate Sodium; Take 1 capsule (100 mg total) by mouth 2 (two) times daily as needed for mild constipation.  Dispense: 100 capsule; Refill: 0 -     Polyethylene Glycol 3350; Take 17 g by mouth 2 (two) times daily as needed.  Dispense: 3350 g; Refill: 1  Benign prostatic hyperplasia with urinary hesitancy  Severe depression ([place])     Return Return fasting for blood work and a complete physical..  Continue follow-up with behavioral Health and weight loss clinic. Will need prostate exam and PSA.  Libby Maw, MD

## 2022-05-24 DIAGNOSIS — M2550 Pain in unspecified joint: Secondary | ICD-10-CM | POA: Diagnosis not present

## 2022-05-24 DIAGNOSIS — R944 Abnormal results of kidney function studies: Secondary | ICD-10-CM | POA: Diagnosis not present

## 2022-05-24 DIAGNOSIS — Z724 Inappropriate diet and eating habits: Secondary | ICD-10-CM | POA: Diagnosis not present

## 2022-05-24 DIAGNOSIS — M5489 Other dorsalgia: Secondary | ICD-10-CM | POA: Diagnosis not present

## 2022-05-24 DIAGNOSIS — Z6831 Body mass index (BMI) 31.0-31.9, adult: Secondary | ICD-10-CM | POA: Diagnosis not present

## 2022-05-24 DIAGNOSIS — E669 Obesity, unspecified: Secondary | ICD-10-CM | POA: Diagnosis not present

## 2022-05-24 DIAGNOSIS — E785 Hyperlipidemia, unspecified: Secondary | ICD-10-CM | POA: Diagnosis not present

## 2022-05-24 DIAGNOSIS — Z713 Dietary counseling and surveillance: Secondary | ICD-10-CM | POA: Diagnosis not present

## 2022-05-24 DIAGNOSIS — Z723 Lack of physical exercise: Secondary | ICD-10-CM | POA: Diagnosis not present

## 2022-05-24 DIAGNOSIS — I10 Essential (primary) hypertension: Secondary | ICD-10-CM | POA: Diagnosis not present

## 2022-05-31 DIAGNOSIS — Z724 Inappropriate diet and eating habits: Secondary | ICD-10-CM | POA: Diagnosis not present

## 2022-05-31 DIAGNOSIS — R944 Abnormal results of kidney function studies: Secondary | ICD-10-CM | POA: Diagnosis not present

## 2022-05-31 DIAGNOSIS — Z723 Lack of physical exercise: Secondary | ICD-10-CM | POA: Diagnosis not present

## 2022-05-31 DIAGNOSIS — M5489 Other dorsalgia: Secondary | ICD-10-CM | POA: Diagnosis not present

## 2022-05-31 DIAGNOSIS — Z713 Dietary counseling and surveillance: Secondary | ICD-10-CM | POA: Diagnosis not present

## 2022-05-31 DIAGNOSIS — M2550 Pain in unspecified joint: Secondary | ICD-10-CM | POA: Diagnosis not present

## 2022-05-31 DIAGNOSIS — E669 Obesity, unspecified: Secondary | ICD-10-CM | POA: Diagnosis not present

## 2022-05-31 DIAGNOSIS — I10 Essential (primary) hypertension: Secondary | ICD-10-CM | POA: Diagnosis not present

## 2022-05-31 DIAGNOSIS — Z6830 Body mass index (BMI) 30.0-30.9, adult: Secondary | ICD-10-CM | POA: Diagnosis not present

## 2022-05-31 DIAGNOSIS — E785 Hyperlipidemia, unspecified: Secondary | ICD-10-CM | POA: Diagnosis not present

## 2022-06-03 ENCOUNTER — Encounter: Payer: 59 | Admitting: Family Medicine

## 2022-06-05 ENCOUNTER — Other Ambulatory Visit: Payer: Self-pay | Admitting: Cardiology

## 2022-06-05 DIAGNOSIS — I10 Essential (primary) hypertension: Secondary | ICD-10-CM

## 2022-06-07 DIAGNOSIS — Z6831 Body mass index (BMI) 31.0-31.9, adult: Secondary | ICD-10-CM | POA: Diagnosis not present

## 2022-06-07 DIAGNOSIS — Z713 Dietary counseling and surveillance: Secondary | ICD-10-CM | POA: Diagnosis not present

## 2022-06-07 DIAGNOSIS — Z723 Lack of physical exercise: Secondary | ICD-10-CM | POA: Diagnosis not present

## 2022-06-07 DIAGNOSIS — E669 Obesity, unspecified: Secondary | ICD-10-CM | POA: Diagnosis not present

## 2022-06-07 DIAGNOSIS — M5489 Other dorsalgia: Secondary | ICD-10-CM | POA: Diagnosis not present

## 2022-06-07 DIAGNOSIS — Z724 Inappropriate diet and eating habits: Secondary | ICD-10-CM | POA: Diagnosis not present

## 2022-06-07 DIAGNOSIS — E785 Hyperlipidemia, unspecified: Secondary | ICD-10-CM | POA: Diagnosis not present

## 2022-06-07 DIAGNOSIS — I10 Essential (primary) hypertension: Secondary | ICD-10-CM | POA: Diagnosis not present

## 2022-06-07 DIAGNOSIS — M2550 Pain in unspecified joint: Secondary | ICD-10-CM | POA: Diagnosis not present

## 2022-06-07 DIAGNOSIS — R944 Abnormal results of kidney function studies: Secondary | ICD-10-CM | POA: Diagnosis not present

## 2022-06-09 ENCOUNTER — Encounter: Payer: Self-pay | Admitting: Family Medicine

## 2022-06-09 ENCOUNTER — Ambulatory Visit (INDEPENDENT_AMBULATORY_CARE_PROVIDER_SITE_OTHER): Payer: 59 | Admitting: Family Medicine

## 2022-06-09 VITALS — BP 134/80 | HR 59 | Temp 98.2°F | Ht 75.0 in | Wt 253.0 lb

## 2022-06-09 DIAGNOSIS — Z8042 Family history of malignant neoplasm of prostate: Secondary | ICD-10-CM | POA: Diagnosis not present

## 2022-06-09 DIAGNOSIS — Z114 Encounter for screening for human immunodeficiency virus [HIV]: Secondary | ICD-10-CM | POA: Diagnosis not present

## 2022-06-09 DIAGNOSIS — Z0000 Encounter for general adult medical examination without abnormal findings: Secondary | ICD-10-CM

## 2022-06-09 DIAGNOSIS — Z1159 Encounter for screening for other viral diseases: Secondary | ICD-10-CM | POA: Diagnosis not present

## 2022-06-09 LAB — URINALYSIS, ROUTINE W REFLEX MICROSCOPIC
Bilirubin Urine: NEGATIVE
Hgb urine dipstick: NEGATIVE
Ketones, ur: NEGATIVE
Leukocytes,Ua: NEGATIVE
Nitrite: NEGATIVE
RBC / HPF: NONE SEEN (ref 0–?)
Specific Gravity, Urine: 1.02 (ref 1.000–1.030)
Total Protein, Urine: NEGATIVE
Urine Glucose: NEGATIVE
Urobilinogen, UA: 0.2 (ref 0.0–1.0)
pH: 7.5 (ref 5.0–8.0)

## 2022-06-09 LAB — COMPREHENSIVE METABOLIC PANEL
ALT: 12 U/L (ref 0–53)
AST: 15 U/L (ref 0–37)
Albumin: 4.4 g/dL (ref 3.5–5.2)
Alkaline Phosphatase: 43 U/L (ref 39–117)
BUN: 10 mg/dL (ref 6–23)
CO2: 27 mEq/L (ref 19–32)
Calcium: 9.5 mg/dL (ref 8.4–10.5)
Chloride: 107 mEq/L (ref 96–112)
Creatinine, Ser: 1.17 mg/dL (ref 0.40–1.50)
GFR: 77.18 mL/min (ref >60.00)
Glucose, Bld: 80 mg/dL (ref 70–99)
Potassium: 4 mEq/L (ref 3.5–5.1)
Sodium: 142 mEq/L (ref 135–145)
Total Bilirubin: 0.7 mg/dL (ref 0.2–1.2)
Total Protein: 6.8 g/dL (ref 6.0–8.3)

## 2022-06-09 LAB — CBC
HCT: 46.1 % (ref 39.0–52.0)
Hemoglobin: 15.4 g/dL (ref 13.0–17.0)
MCHC: 33.4 g/dL (ref 30.0–36.0)
MCV: 81.4 fl (ref 78.0–100.0)
Platelets: 247 10*3/uL (ref 150.0–400.0)
RBC: 5.66 Mil/uL (ref 4.22–5.81)
RDW: 14.1 % (ref 11.5–15.5)
WBC: 3 10*3/uL — ABNORMAL LOW (ref 4.0–10.5)

## 2022-06-09 LAB — LIPID PANEL
Cholesterol: 170 mg/dL (ref 0–200)
HDL: 48.3 mg/dL (ref >39.00)
LDL Cholesterol: 111 mg/dL — ABNORMAL HIGH (ref 0–99)
NonHDL: 121.27
Total CHOL/HDL Ratio: 4
Triglycerides: 51 mg/dL (ref 0.0–149.0)
VLDL: 10.2 mg/dL (ref 0.0–40.0)

## 2022-06-09 LAB — PSA: PSA: 0.4 ng/mL (ref 0.10–4.00)

## 2022-06-09 NOTE — Progress Notes (Signed)
Established Patient Office Visit   Subjective:  Patient ID: Dominic Joseph, male    DOB: 1980/08/20  Age: 42 y.o. MRN: [id number]  Chief Complaint  Patient presents with   Annual Exam    CPE, no concerns. Patient fasting.     HPI Encounter Diagnoses  Name Primary?   Healthcare maintenance Yes   Screening for HIV (human immunodeficiency virus)    Encounter for hepatitis C screening test for low risk patient    Family history of prostate cancer    Back for physical exam.  He is fasting.  He is physically active to stay in shape for his job.  He has regular dental care.  Things are well at home.  He rarely drinks alcohol.  He smokes cigars on occasion.  MiraLAX is helping constipation.  Family history of prostate disease.  Urine stream is strong there is postvoid dribbling at times.  Does experience nocturia when drinking fluids right before bedtime.   Review of Systems  Constitutional: Negative.   HENT: Negative.    Eyes:  Negative for blurred vision, discharge and redness.  Respiratory: Negative.    Cardiovascular: Negative.   Gastrointestinal:  Negative for abdominal pain.  Genitourinary: Negative.   Musculoskeletal: Negative.  Negative for myalgias.  Skin:  Negative for rash.  Neurological:  Negative for tingling, loss of consciousness and weakness.  Endo/Heme/Allergies:  Negative for polydipsia.      06/09/2022   10:30 AM 06/09/2022   10:01 AM 05/20/2022    4:46 PM  Depression screen PHQ 2/9  Decreased Interest 0 0 2  Down, Depressed, Hopeless 0 0 1  PHQ - 2 Score 0 0 3  Altered sleeping 0  2  Tired, decreased energy 2  3  Change in appetite 1  2  Feeling bad or failure about yourself  0  1  Trouble concentrating 2  3  Moving slowly or fidgety/restless 2  1  Suicidal thoughts 0  1  PHQ-9 Score 7  16  Difficult doing work/chores Somewhat difficult  Somewhat difficult      Current Outpatient Medications:    amLODipine (NORVASC) 10 MG tablet, Take 1 tablet (10  mg total) by mouth every evening., Disp: 100 tablet, Rfl: 1   docusate sodium (COLACE) 100 MG capsule, Take 1 capsule (100 mg total) by mouth 2 (two) times daily as needed for mild constipation., Disp: 100 capsule, Rfl: 0   EPINEPHrine 0.3 mg/0.3 mL IJ SOAJ injection, Inject into the muscle., Disp: , Rfl:    fexofenadine (ALLEGRA) 180 MG tablet, Take 180 mg by mouth daily., Disp: , Rfl:    hydrochlorothiazide (HYDRODIURIL) 25 MG tablet, TAKE 1 TABLET BY MOUTH EVERY DAY IN THE MORNING, Disp: 90 tablet, Rfl: 0   Multiple Vitamin (MULTI-VITAMIN) tablet, Take by mouth., Disp: , Rfl:    Omega-3 Fatty Acids (FISH OIL) 300 MG CAPS, Take by mouth., Disp: , Rfl:    phentermine (ADIPEX-P) 37.5 MG tablet, Take 37.5 mg by mouth every morning., Disp: , Rfl:    polyethylene glycol powder (GLYCOLAX/MIRALAX) 17 GM/SCOOP powder, Take 17 g by mouth 2 (two) times daily as needed., Disp: 3350 g, Rfl: 1   Vortioxetine HBr (TRINTELLIX PO), Take 5 mg by mouth daily., Disp: , Rfl:    Acetaminophen Extra Strength 500 MG CAPS, Take 2 capsules by mouth every 8 (eight) hours. (Patient not taking: Reported on 06/09/2022), Disp: , Rfl:    Objective:     BP 134/80 (BP Location: Left  Arm, Patient Position: Sitting, Cuff Size: Large)   Pulse (!) 59   Temp 98.2 F (36.8 C) (Temporal)   Ht 6\' 3"  (1.905 m)   Wt 253 lb (114.8 kg)   SpO2 98%   BMI 31.62 kg/m  BP Readings from Last 3 Encounters:  06/09/22 134/80  05/20/22 138/82  05/12/22 136/88   Wt Readings from Last 3 Encounters:  06/09/22 253 lb (114.8 kg)  05/20/22 255 lb 3.2 oz (115.8 kg)  04/09/21 280 lb (127 kg)      Physical Exam Constitutional:      General: He is not in acute distress.    Appearance: Normal appearance. He is not ill-appearing, toxic-appearing or diaphoretic.  HENT:     Head: Normocephalic and atraumatic.     Right Ear: External ear normal.     Left Ear: External ear normal.     Mouth/Throat:     Mouth: Mucous membranes are moist.      Pharynx: Oropharynx is clear. No oropharyngeal exudate or posterior oropharyngeal erythema.  Eyes:     General: No scleral icterus.       Right eye: No discharge.        Left eye: No discharge.     Extraocular Movements: Extraocular movements intact.     Conjunctiva/sclera: Conjunctivae normal.     Pupils: Pupils are equal, round, and reactive to light.  Cardiovascular:     Rate and Rhythm: Normal rate and regular rhythm.  Pulmonary:     Effort: Pulmonary effort is normal. No respiratory distress.     Breath sounds: Normal breath sounds.  Abdominal:     General: Bowel sounds are normal.     Tenderness: There is no abdominal tenderness. There is no guarding or rebound.     Hernia: No hernia is present. There is no hernia in the left inguinal area or right inguinal area.  Genitourinary:    Penis: Circumcised. No hypospadias, erythema, tenderness, discharge, swelling or lesions.      Testes:        Right: Mass, tenderness or swelling not present. Right testis is descended.        Left: Mass, tenderness or swelling not present. Left testis is descended.     Epididymis:     Right: Not inflamed or enlarged.     Left: Not inflamed or enlarged.  Musculoskeletal:     Cervical back: No rigidity or tenderness.  Lymphadenopathy:     Lower Body: No right inguinal adenopathy. No left inguinal adenopathy.  Skin:    General: Skin is warm and dry.  Neurological:     Mental Status: He is alert and oriented to person, place, and time.  Psychiatric:        Mood and Affect: Mood normal.        Behavior: Behavior normal.      No results found for any visits on 06/09/22.    The 10-year ASCVD risk score (Arnett DK, et al., 2019) is: 9.7%    Assessment & Plan:   Healthcare maintenance -     CBC -     Comprehensive metabolic panel -     Lipid panel -     Urinalysis, Routine w reflex microscopic  Screening for HIV (human immunodeficiency virus) -     HIV Antibody (routine testing w  rflx)  Encounter for hepatitis C screening test for low risk patient -     Hepatitis C antibody  Family history of prostate cancer -  PSA    Return in about 1 year (around 06/09/2023), or if symptoms worsen or fail to improve.  Information was given on health maintenance and disease prevention.  Information was also given on BPH.  He will decrease caffeine intake to no more than 1-2 servings daily.  Could consider referral to urology.  Continue follow-up with behavioral medicine and weight loss physician.  Libby Maw, MD

## 2022-06-10 LAB — HEPATITIS C ANTIBODY: Hepatitis C Ab: NONREACTIVE

## 2022-06-10 LAB — HIV ANTIBODY (ROUTINE TESTING W REFLEX): HIV 1&2 Ab, 4th Generation: NONREACTIVE

## 2022-06-11 ENCOUNTER — Telehealth: Payer: Self-pay

## 2022-06-11 NOTE — Telephone Encounter (Signed)
Called patient went over labs recommendations and risk scores patient have concerns about continued low WBC levels not sure the cause of this or what he can do to improve levels. concerns about continued low WBC levels not sure the cause of this or what he can do to improve levels.Please advise

## 2022-06-12 NOTE — Telephone Encounter (Signed)
Appointment scheduled for follow up.  

## 2022-06-13 ENCOUNTER — Encounter: Payer: Self-pay | Admitting: Family Medicine

## 2022-06-14 DIAGNOSIS — Z724 Inappropriate diet and eating habits: Secondary | ICD-10-CM | POA: Diagnosis not present

## 2022-06-14 DIAGNOSIS — R944 Abnormal results of kidney function studies: Secondary | ICD-10-CM | POA: Diagnosis not present

## 2022-06-14 DIAGNOSIS — Z723 Lack of physical exercise: Secondary | ICD-10-CM | POA: Diagnosis not present

## 2022-06-14 DIAGNOSIS — M5489 Other dorsalgia: Secondary | ICD-10-CM | POA: Diagnosis not present

## 2022-06-14 DIAGNOSIS — I10 Essential (primary) hypertension: Secondary | ICD-10-CM | POA: Diagnosis not present

## 2022-06-14 DIAGNOSIS — Z6830 Body mass index (BMI) 30.0-30.9, adult: Secondary | ICD-10-CM | POA: Diagnosis not present

## 2022-06-14 DIAGNOSIS — Z713 Dietary counseling and surveillance: Secondary | ICD-10-CM | POA: Diagnosis not present

## 2022-06-14 DIAGNOSIS — E669 Obesity, unspecified: Secondary | ICD-10-CM | POA: Diagnosis not present

## 2022-06-14 DIAGNOSIS — M2550 Pain in unspecified joint: Secondary | ICD-10-CM | POA: Diagnosis not present

## 2022-06-14 DIAGNOSIS — E785 Hyperlipidemia, unspecified: Secondary | ICD-10-CM | POA: Diagnosis not present

## 2022-06-18 ENCOUNTER — Encounter: Payer: Self-pay | Admitting: Family Medicine

## 2022-06-19 ENCOUNTER — Other Ambulatory Visit: Payer: Self-pay

## 2022-06-19 DIAGNOSIS — I10 Essential (primary) hypertension: Secondary | ICD-10-CM

## 2022-06-19 MED ORDER — HYDROCHLOROTHIAZIDE 25 MG PO TABS: ORAL_TABLET | ORAL | 0 refills | Status: DC

## 2022-06-21 DIAGNOSIS — Z723 Lack of physical exercise: Secondary | ICD-10-CM | POA: Diagnosis not present

## 2022-06-21 DIAGNOSIS — Z724 Inappropriate diet and eating habits: Secondary | ICD-10-CM | POA: Diagnosis not present

## 2022-06-21 DIAGNOSIS — R944 Abnormal results of kidney function studies: Secondary | ICD-10-CM | POA: Diagnosis not present

## 2022-06-21 DIAGNOSIS — E785 Hyperlipidemia, unspecified: Secondary | ICD-10-CM | POA: Diagnosis not present

## 2022-06-21 DIAGNOSIS — M5489 Other dorsalgia: Secondary | ICD-10-CM | POA: Diagnosis not present

## 2022-06-21 DIAGNOSIS — E669 Obesity, unspecified: Secondary | ICD-10-CM | POA: Diagnosis not present

## 2022-06-21 DIAGNOSIS — Z713 Dietary counseling and surveillance: Secondary | ICD-10-CM | POA: Diagnosis not present

## 2022-06-21 DIAGNOSIS — I10 Essential (primary) hypertension: Secondary | ICD-10-CM | POA: Diagnosis not present

## 2022-06-21 DIAGNOSIS — Z6830 Body mass index (BMI) 30.0-30.9, adult: Secondary | ICD-10-CM | POA: Diagnosis not present

## 2022-06-21 DIAGNOSIS — M2550 Pain in unspecified joint: Secondary | ICD-10-CM | POA: Diagnosis not present

## 2022-06-28 DIAGNOSIS — Z6830 Body mass index (BMI) 30.0-30.9, adult: Secondary | ICD-10-CM | POA: Diagnosis not present

## 2022-06-28 DIAGNOSIS — M5489 Other dorsalgia: Secondary | ICD-10-CM | POA: Diagnosis not present

## 2022-06-28 DIAGNOSIS — M2550 Pain in unspecified joint: Secondary | ICD-10-CM | POA: Diagnosis not present

## 2022-06-28 DIAGNOSIS — I10 Essential (primary) hypertension: Secondary | ICD-10-CM | POA: Diagnosis not present

## 2022-06-28 DIAGNOSIS — E785 Hyperlipidemia, unspecified: Secondary | ICD-10-CM | POA: Diagnosis not present

## 2022-06-28 DIAGNOSIS — Z724 Inappropriate diet and eating habits: Secondary | ICD-10-CM | POA: Diagnosis not present

## 2022-06-28 DIAGNOSIS — R944 Abnormal results of kidney function studies: Secondary | ICD-10-CM | POA: Diagnosis not present

## 2022-06-28 DIAGNOSIS — E669 Obesity, unspecified: Secondary | ICD-10-CM | POA: Diagnosis not present

## 2022-06-28 DIAGNOSIS — Z723 Lack of physical exercise: Secondary | ICD-10-CM | POA: Diagnosis not present

## 2022-06-28 DIAGNOSIS — Z713 Dietary counseling and surveillance: Secondary | ICD-10-CM | POA: Diagnosis not present

## 2022-07-05 DIAGNOSIS — R944 Abnormal results of kidney function studies: Secondary | ICD-10-CM | POA: Diagnosis not present

## 2022-07-05 DIAGNOSIS — I10 Essential (primary) hypertension: Secondary | ICD-10-CM | POA: Diagnosis not present

## 2022-07-05 DIAGNOSIS — Z724 Inappropriate diet and eating habits: Secondary | ICD-10-CM | POA: Diagnosis not present

## 2022-07-05 DIAGNOSIS — Z713 Dietary counseling and surveillance: Secondary | ICD-10-CM | POA: Diagnosis not present

## 2022-07-05 DIAGNOSIS — Z6830 Body mass index (BMI) 30.0-30.9, adult: Secondary | ICD-10-CM | POA: Diagnosis not present

## 2022-07-05 DIAGNOSIS — Z723 Lack of physical exercise: Secondary | ICD-10-CM | POA: Diagnosis not present

## 2022-07-05 DIAGNOSIS — E669 Obesity, unspecified: Secondary | ICD-10-CM | POA: Diagnosis not present

## 2022-07-05 DIAGNOSIS — M5489 Other dorsalgia: Secondary | ICD-10-CM | POA: Diagnosis not present

## 2022-07-05 DIAGNOSIS — M2550 Pain in unspecified joint: Secondary | ICD-10-CM | POA: Diagnosis not present

## 2022-07-05 DIAGNOSIS — E785 Hyperlipidemia, unspecified: Secondary | ICD-10-CM | POA: Diagnosis not present

## 2022-07-12 DIAGNOSIS — R944 Abnormal results of kidney function studies: Secondary | ICD-10-CM | POA: Diagnosis not present

## 2022-07-12 DIAGNOSIS — E669 Obesity, unspecified: Secondary | ICD-10-CM | POA: Diagnosis not present

## 2022-07-12 DIAGNOSIS — Z713 Dietary counseling and surveillance: Secondary | ICD-10-CM | POA: Diagnosis not present

## 2022-07-12 DIAGNOSIS — Z723 Lack of physical exercise: Secondary | ICD-10-CM | POA: Diagnosis not present

## 2022-07-12 DIAGNOSIS — M5489 Other dorsalgia: Secondary | ICD-10-CM | POA: Diagnosis not present

## 2022-07-12 DIAGNOSIS — M2550 Pain in unspecified joint: Secondary | ICD-10-CM | POA: Diagnosis not present

## 2022-07-12 DIAGNOSIS — I10 Essential (primary) hypertension: Secondary | ICD-10-CM | POA: Diagnosis not present

## 2022-07-12 DIAGNOSIS — E785 Hyperlipidemia, unspecified: Secondary | ICD-10-CM | POA: Diagnosis not present

## 2022-07-12 DIAGNOSIS — Z724 Inappropriate diet and eating habits: Secondary | ICD-10-CM | POA: Diagnosis not present

## 2022-07-19 DIAGNOSIS — E785 Hyperlipidemia, unspecified: Secondary | ICD-10-CM | POA: Diagnosis not present

## 2022-07-19 DIAGNOSIS — I10 Essential (primary) hypertension: Secondary | ICD-10-CM | POA: Diagnosis not present

## 2022-07-19 DIAGNOSIS — R944 Abnormal results of kidney function studies: Secondary | ICD-10-CM | POA: Diagnosis not present

## 2022-07-19 DIAGNOSIS — Z6830 Body mass index (BMI) 30.0-30.9, adult: Secondary | ICD-10-CM | POA: Diagnosis not present

## 2022-07-19 DIAGNOSIS — M5489 Other dorsalgia: Secondary | ICD-10-CM | POA: Diagnosis not present

## 2022-07-19 DIAGNOSIS — Z713 Dietary counseling and surveillance: Secondary | ICD-10-CM | POA: Diagnosis not present

## 2022-07-19 DIAGNOSIS — M2550 Pain in unspecified joint: Secondary | ICD-10-CM | POA: Diagnosis not present

## 2022-07-19 DIAGNOSIS — E669 Obesity, unspecified: Secondary | ICD-10-CM | POA: Diagnosis not present

## 2022-07-19 DIAGNOSIS — Z723 Lack of physical exercise: Secondary | ICD-10-CM | POA: Diagnosis not present

## 2022-07-19 DIAGNOSIS — Z724 Inappropriate diet and eating habits: Secondary | ICD-10-CM | POA: Diagnosis not present

## 2022-07-23 ENCOUNTER — Encounter: Payer: Self-pay | Admitting: Family Medicine

## 2022-07-23 ENCOUNTER — Other Ambulatory Visit: Payer: Self-pay

## 2022-07-23 MED ORDER — VORTIOXETINE HBR 5 MG PO TABS: 5.0000 mg | ORAL_TABLET | Freq: Every day | ORAL | 2 refills | Status: DC

## 2022-07-23 NOTE — Telephone Encounter (Signed)
Refill request for pending Rx last OV 06/09/22 please advise

## 2022-08-02 DIAGNOSIS — Z6831 Body mass index (BMI) 31.0-31.9, adult: Secondary | ICD-10-CM | POA: Diagnosis not present

## 2022-08-02 DIAGNOSIS — M5489 Other dorsalgia: Secondary | ICD-10-CM | POA: Diagnosis not present

## 2022-08-02 DIAGNOSIS — Z724 Inappropriate diet and eating habits: Secondary | ICD-10-CM | POA: Diagnosis not present

## 2022-08-02 DIAGNOSIS — E785 Hyperlipidemia, unspecified: Secondary | ICD-10-CM | POA: Diagnosis not present

## 2022-08-02 DIAGNOSIS — I10 Essential (primary) hypertension: Secondary | ICD-10-CM | POA: Diagnosis not present

## 2022-08-02 DIAGNOSIS — R944 Abnormal results of kidney function studies: Secondary | ICD-10-CM | POA: Diagnosis not present

## 2022-08-02 DIAGNOSIS — Z723 Lack of physical exercise: Secondary | ICD-10-CM | POA: Diagnosis not present

## 2022-08-02 DIAGNOSIS — Z713 Dietary counseling and surveillance: Secondary | ICD-10-CM | POA: Diagnosis not present

## 2022-08-02 DIAGNOSIS — E669 Obesity, unspecified: Secondary | ICD-10-CM | POA: Diagnosis not present

## 2022-08-02 DIAGNOSIS — M2550 Pain in unspecified joint: Secondary | ICD-10-CM | POA: Diagnosis not present

## 2022-08-12 DIAGNOSIS — E669 Obesity, unspecified: Secondary | ICD-10-CM | POA: Diagnosis not present

## 2022-08-12 DIAGNOSIS — Z713 Dietary counseling and surveillance: Secondary | ICD-10-CM | POA: Diagnosis not present

## 2022-08-12 DIAGNOSIS — I10 Essential (primary) hypertension: Secondary | ICD-10-CM | POA: Diagnosis not present

## 2022-08-12 DIAGNOSIS — Z6830 Body mass index (BMI) 30.0-30.9, adult: Secondary | ICD-10-CM | POA: Diagnosis not present

## 2022-08-12 DIAGNOSIS — E785 Hyperlipidemia, unspecified: Secondary | ICD-10-CM | POA: Diagnosis not present

## 2022-08-12 DIAGNOSIS — Z724 Inappropriate diet and eating habits: Secondary | ICD-10-CM | POA: Diagnosis not present

## 2022-08-12 DIAGNOSIS — M2550 Pain in unspecified joint: Secondary | ICD-10-CM | POA: Diagnosis not present

## 2022-08-12 DIAGNOSIS — R944 Abnormal results of kidney function studies: Secondary | ICD-10-CM | POA: Diagnosis not present

## 2022-08-12 DIAGNOSIS — M5489 Other dorsalgia: Secondary | ICD-10-CM | POA: Diagnosis not present

## 2022-08-12 DIAGNOSIS — Z723 Lack of physical exercise: Secondary | ICD-10-CM | POA: Diagnosis not present

## 2022-08-14 ENCOUNTER — Ambulatory Visit: Payer: 59 | Admitting: Family Medicine

## 2022-08-15 ENCOUNTER — Ambulatory Visit (INDEPENDENT_AMBULATORY_CARE_PROVIDER_SITE_OTHER): Payer: 59 | Admitting: Family Medicine

## 2022-08-15 ENCOUNTER — Encounter: Payer: Self-pay | Admitting: Family Medicine

## 2022-08-15 VITALS — BP 156/78 | HR 69 | Temp 98.5°F | Ht 75.0 in | Wt 249.6 lb

## 2022-08-15 DIAGNOSIS — D709 Neutropenia, unspecified: Secondary | ICD-10-CM | POA: Diagnosis not present

## 2022-08-15 DIAGNOSIS — I10 Essential (primary) hypertension: Secondary | ICD-10-CM

## 2022-08-15 DIAGNOSIS — Z720 Tobacco use: Secondary | ICD-10-CM | POA: Diagnosis not present

## 2022-08-15 NOTE — Progress Notes (Signed)
Established Patient Office Visit   Subjective:  Patient ID: GAMBLE KNICELEY, male    DOB: 1980-08-17  Age: 42 y.o. MRN: [id number]  Chief Complaint  Patient presents with   Medical Management of Chronic Issues    Follow up on low WBC, no concerns.     HPI Encounter Diagnoses  Name Primary?   Essential hypertension Yes   Neutropenia, unspecified type (HCC)    Tobacco use    For follow-up of above.  Blood pressure at home has been running in the 130s over 60s to 70s.  He did consume a sports drink just prior to coming in.  Also following up for declining WBC.    Review of Systems  Constitutional: Negative.   HENT: Negative.    Eyes:  Negative for blurred vision, discharge and redness.  Respiratory: Negative.    Cardiovascular: Negative.   Gastrointestinal:  Negative for abdominal pain.  Genitourinary: Negative.   Musculoskeletal: Negative.  Negative for myalgias.  Skin:  Negative for rash.  Neurological:  Negative for tingling, loss of consciousness and weakness.  Endo/Heme/Allergies:  Negative for polydipsia.     Current Outpatient Medications:    amLODipine (NORVASC) 10 MG tablet, Take 1 tablet (10 mg total) by mouth every evening., Disp: 100 tablet, Rfl: 1   docusate sodium (COLACE) 100 MG capsule, Take 1 capsule (100 mg total) by mouth 2 (two) times daily as needed for mild constipation., Disp: 100 capsule, Rfl: 0   EPINEPHrine 0.3 mg/0.3 mL IJ SOAJ injection, Inject into the muscle., Disp: , Rfl:    fexofenadine (ALLEGRA) 180 MG tablet, Take 180 mg by mouth daily., Disp: , Rfl:    hydrochlorothiazide (HYDRODIURIL) 25 MG tablet, TAKE 1 TABLET BY MOUTH EVERY DAY IN THE MORNING, Disp: 90 tablet, Rfl: 0   Multiple Vitamin (MULTI-VITAMIN) tablet, Take by mouth., Disp: , Rfl:    Omega-3 Fatty Acids (FISH OIL) 300 MG CAPS, Take by mouth., Disp: , Rfl:    phentermine (ADIPEX-P) 37.5 MG tablet, Take 37.5 mg by mouth every morning., Disp: , Rfl:    polyethylene glycol powder  (GLYCOLAX/MIRALAX) 17 GM/SCOOP powder, Take 17 g by mouth 2 (two) times daily as needed., Disp: 3350 g, Rfl: 1   vortioxetine HBr (TRINTELLIX) 5 MG TABS tablet, Take 1 tablet (5 mg total) by mouth daily., Disp: 30 tablet, Rfl: 2   Acetaminophen Extra Strength 500 MG CAPS, Take 2 capsules by mouth every 8 (eight) hours. (Patient not taking: Reported on 06/09/2022), Disp: , Rfl:    Objective:     BP (!) 156/78 (BP Location: Left Arm, Patient Position: Sitting, Cuff Size: Large)   Pulse 69   Temp 98.5 F (36.9 C) (Temporal)   Ht 6\' 3"  (1.905 m)   Wt 249 lb 9.6 oz (113.2 kg)   SpO2 95%   BMI 31.20 kg/m  BP Readings from Last 3 Encounters:  08/15/22 (!) 156/78  06/09/22 134/80  05/20/22 138/82   Wt Readings from Last 3 Encounters:  08/15/22 249 lb 9.6 oz (113.2 kg)  06/09/22 253 lb (114.8 kg)  05/20/22 255 lb 3.2 oz (115.8 kg)      Physical Exam Constitutional:      General: He is not in acute distress.    Appearance: Normal appearance. He is not ill-appearing, toxic-appearing or diaphoretic.  HENT:     Head: Normocephalic and atraumatic.     Right Ear: External ear normal.     Left Ear: External ear normal.  Eyes:  General: No scleral icterus.       Right eye: No discharge.        Left eye: No discharge.     Extraocular Movements: Extraocular movements intact.     Conjunctiva/sclera: Conjunctivae normal.  Pulmonary:     Effort: Pulmonary effort is normal. No respiratory distress.  Skin:    General: Skin is warm and dry.  Neurological:     Mental Status: He is alert and oriented to person, place, and time.  Psychiatric:        Mood and Affect: Mood normal.        Behavior: Behavior normal.      No results found for any visits on 08/15/22.    The 10-year ASCVD risk score (Arnett DK, et al., 2019) is: 13.5%    Assessment & Plan:   Essential hypertension  Neutropenia, unspecified type (HCC) -     B12 and Folate Panel -     CBC with  Differential/Platelet -     Ceruloplasmin  Tobacco use    Return in about 6 months (around 02/15/2023), or if symptoms worsen or fail to improve.  Information was given on managing hypertension.  Information was given on the health risks of smoking.  CBC with differential, B12 and folate and copper levels for evaluation of neutropenia.  Mliss Sax, MD

## 2022-08-19 LAB — CBC WITH DIFFERENTIAL/PLATELET
Absolute Monocytes: 442 cells/uL (ref 200–950)
Basophils Absolute: 22 cells/uL (ref 0–200)
Basophils Relative: 0.4 %
Eosinophils Absolute: 73 cells/uL (ref 15–500)
Eosinophils Relative: 1.3 %
HCT: 45.5 % (ref 38.5–50.0)
Hemoglobin: 15.1 g/dL (ref 13.2–17.1)
Lymphs Abs: 1585 cells/uL (ref 850–3900)
MCH: 26.5 pg — ABNORMAL LOW (ref 27.0–33.0)
MCHC: 33.2 g/dL (ref 32.0–36.0)
MCV: 80 fL (ref 80.0–100.0)
MPV: 10.4 fL (ref 7.5–12.5)
Monocytes Relative: 7.9 %
Neutro Abs: 3478 cells/uL (ref 1500–7800)
Neutrophils Relative %: 62.1 %
Platelets: 277 10*3/uL (ref 140–400)
RBC: 5.69 10*6/uL (ref 4.20–5.80)
RDW: 14.4 % (ref 11.0–15.0)
Total Lymphocyte: 28.3 %
WBC: 5.6 10*3/uL (ref 3.8–10.8)

## 2022-08-19 LAB — B12 AND FOLATE PANEL
Folate: 8.6 ng/mL
Vitamin B-12: 1217 pg/mL — ABNORMAL HIGH (ref 200–1100)

## 2022-08-19 LAB — CERULOPLASMIN: Ceruloplasmin: 22 mg/dL (ref 14–30)

## 2022-08-23 DIAGNOSIS — I10 Essential (primary) hypertension: Secondary | ICD-10-CM | POA: Diagnosis not present

## 2022-08-23 DIAGNOSIS — Z6830 Body mass index (BMI) 30.0-30.9, adult: Secondary | ICD-10-CM | POA: Diagnosis not present

## 2022-08-23 DIAGNOSIS — E669 Obesity, unspecified: Secondary | ICD-10-CM | POA: Diagnosis not present

## 2022-08-23 DIAGNOSIS — E785 Hyperlipidemia, unspecified: Secondary | ICD-10-CM | POA: Diagnosis not present

## 2022-08-23 DIAGNOSIS — M5489 Other dorsalgia: Secondary | ICD-10-CM | POA: Diagnosis not present

## 2022-08-23 DIAGNOSIS — M2550 Pain in unspecified joint: Secondary | ICD-10-CM | POA: Diagnosis not present

## 2022-08-23 DIAGNOSIS — Z713 Dietary counseling and surveillance: Secondary | ICD-10-CM | POA: Diagnosis not present

## 2022-08-23 DIAGNOSIS — Z724 Inappropriate diet and eating habits: Secondary | ICD-10-CM | POA: Diagnosis not present

## 2022-08-23 DIAGNOSIS — Z723 Lack of physical exercise: Secondary | ICD-10-CM | POA: Diagnosis not present

## 2022-08-23 DIAGNOSIS — R944 Abnormal results of kidney function studies: Secondary | ICD-10-CM | POA: Diagnosis not present

## 2022-09-06 DIAGNOSIS — Z713 Dietary counseling and surveillance: Secondary | ICD-10-CM | POA: Diagnosis not present

## 2022-09-06 DIAGNOSIS — Z6830 Body mass index (BMI) 30.0-30.9, adult: Secondary | ICD-10-CM | POA: Diagnosis not present

## 2022-09-06 DIAGNOSIS — R944 Abnormal results of kidney function studies: Secondary | ICD-10-CM | POA: Diagnosis not present

## 2022-09-06 DIAGNOSIS — E669 Obesity, unspecified: Secondary | ICD-10-CM | POA: Diagnosis not present

## 2022-09-06 DIAGNOSIS — Z724 Inappropriate diet and eating habits: Secondary | ICD-10-CM | POA: Diagnosis not present

## 2022-09-06 DIAGNOSIS — M2550 Pain in unspecified joint: Secondary | ICD-10-CM | POA: Diagnosis not present

## 2022-09-06 DIAGNOSIS — I10 Essential (primary) hypertension: Secondary | ICD-10-CM | POA: Diagnosis not present

## 2022-09-06 DIAGNOSIS — Z723 Lack of physical exercise: Secondary | ICD-10-CM | POA: Diagnosis not present

## 2022-09-06 DIAGNOSIS — E785 Hyperlipidemia, unspecified: Secondary | ICD-10-CM | POA: Diagnosis not present

## 2022-09-06 DIAGNOSIS — M5489 Other dorsalgia: Secondary | ICD-10-CM | POA: Diagnosis not present

## 2022-09-15 ENCOUNTER — Other Ambulatory Visit: Payer: Self-pay | Admitting: Family Medicine

## 2022-09-15 DIAGNOSIS — I10 Essential (primary) hypertension: Secondary | ICD-10-CM

## 2022-10-02 ENCOUNTER — Ambulatory Visit: Payer: 59 | Admitting: Orthopedic Surgery

## 2022-10-02 ENCOUNTER — Encounter: Payer: Self-pay | Admitting: Orthopedic Surgery

## 2022-10-02 VITALS — BP 167/109 | HR 60 | Ht 75.0 in

## 2022-10-02 DIAGNOSIS — M7062 Trochanteric bursitis, left hip: Secondary | ICD-10-CM

## 2022-10-02 MED ORDER — METHYLPREDNISOLONE ACETATE 40 MG/ML IJ SUSP
40.0000 mg | Freq: Once | INTRAMUSCULAR | Status: AC
  Administered 2022-10-02: 40 mg via INTRA_ARTICULAR

## 2022-10-02 NOTE — Patient Instructions (Signed)
Ice 20 min every night   Take over the counter aleve   Call in 2 weeks if not better

## 2022-10-02 NOTE — Progress Notes (Addendum)
Patient: Dominic Joseph           Date of Birth: Mar 22, 1981           MRN: [id number] Visit Date: 10/02/2022 Requested by: Mliss Sax, MD [address] [place],  [place] [address] PCP: Mliss Sax, MD    Chief Complaint  Patient presents with   Hip Pain    Pain in left hip x 1-61 yrs     42 year old male presents with pain over the lateral side of his left hip Radiates into the hip area as he puts it.  No back pain no radicular symptoms  Took NSAIDs with no relief.  He does have hypertension  No allergies  The patient was seen by Delbert Harness they gave him an injection but then his insurance did not cover him to go to [employer] so he presents to [place] with recurrent pain lateral side    Body mass index is 31.2 kg/m.   Problem list, medical hx, medications and allergies reviewed   Review of Systems  Gastrointestinal:  Positive for constipation.  Genitourinary:  Positive for dysuria.  Musculoskeletal:  Positive for joint pain.  Neurological:  Positive for headaches.  Endo/Heme/Allergies:  Positive for environmental allergies.  Psychiatric/Behavioral:  Positive for depression.      No Known Allergies  BP (!) 167/109   Pulse 60   Ht 6\' 3"  (1.905 m)   BMI 31.20 kg/m    Physical exam: Physical Exam  General: normal development grooming and hygiene  CDV: Normal pulse and perfusion no edema  MS: Oriented x 3  PSYCH: Mood and affect  NEURO: Normal sensation  MSK:  Normal range of motion pain over the lateral side  Leg lengths.  Tenderness directly over the greater iliotibial band tenderness as well  Data reviewed:   Outside imaging looks normal in terms of x-ray to  He had a CT scan of his abdomen I did not see any lumbar disc disease  Assessment and plan:  Encounter Diagnosis  Name Primary?   Trochanteric bursitis, left hip Yes    Injection and OTC nsaids and ice    Meds ordered this encounter  Medications    methylPREDNISolone acetate (DEPO-MEDROL) injection 40 mg    Procedures:   Procedure note injection for left hip bursitis  Verbal consent was obtained for injection of the  left hip   Timeout was completed to confirm the injection site  The medications used were 40 mg of Depo-Medrol and 1% lidocaine 3 cc  Anesthesia was provided by ethyl chloride and the skin was prepped with alcohol.  After cleaning the skin with alcohol a 25-gauge needle was used to inject the left hip greater trochanteric bursa

## 2022-10-15 ENCOUNTER — Other Ambulatory Visit: Payer: Self-pay

## 2022-10-15 DIAGNOSIS — Z0283 Encounter for blood-alcohol and blood-drug test: Secondary | ICD-10-CM

## 2022-10-15 NOTE — Progress Notes (Signed)
Presents to [facility] for random drug & alcohol screen.  [facility] Acct #:  [id number] [facility] Specimen #:  [id number]  Breath Alcohol Results = 0.000  AMD

## 2022-10-16 ENCOUNTER — Ambulatory Visit (HOSPITAL_BASED_OUTPATIENT_CLINIC_OR_DEPARTMENT_OTHER): Payer: 59 | Admitting: Orthopaedic Surgery

## 2022-10-22 ENCOUNTER — Ambulatory Visit (INDEPENDENT_AMBULATORY_CARE_PROVIDER_SITE_OTHER): Payer: 59 | Admitting: Family Medicine

## 2022-10-22 ENCOUNTER — Encounter: Payer: Self-pay | Admitting: Family Medicine

## 2022-10-22 VITALS — BP 138/90 | HR 60 | Temp 97.7°F | Ht 75.0 in | Wt 259.0 lb

## 2022-10-22 DIAGNOSIS — J4520 Mild intermittent asthma, uncomplicated: Secondary | ICD-10-CM | POA: Diagnosis not present

## 2022-10-22 DIAGNOSIS — J0100 Acute maxillary sinusitis, unspecified: Secondary | ICD-10-CM

## 2022-10-22 MED ORDER — AMOXICILLIN 875 MG PO TABS: 875.0000 mg | ORAL_TABLET | Freq: Two times a day (BID) | ORAL | 0 refills | Status: AC

## 2022-10-22 MED ORDER — ALBUTEROL SULFATE HFA 108 (90 BASE) MCG/ACT IN AERS
1.0000 | INHALATION_SPRAY | Freq: Four times a day (QID) | RESPIRATORY_TRACT | 1 refills | Status: AC | PRN
Start: 2022-10-22 — End: ?

## 2022-10-22 MED ORDER — PREDNISONE 10 MG PO TABS
10.0000 mg | ORAL_TABLET | Freq: Every day | ORAL | 0 refills | Status: AC
Start: 2022-10-22 — End: 2022-10-28

## 2022-10-22 NOTE — Progress Notes (Signed)
Established Patient Office Visit   Subjective:  Patient ID: Dominic Joseph, male    DOB: 1980/03/26  Age: 42 y.o. MRN: [id number]  Chief Complaint  Patient presents with   Cough    Sore throat, dry cough, congestion, fatigue and left ear discomfort x 7 days. Taking otc tylenol. Took home covid test that was negative.     Cough Associated symptoms include wheezing. Pertinent negatives include no chills, eye redness, fever, myalgias or rash.   Encounter Diagnoses  Name Primary?   Acute non-recurrent maxillary sinusitis Yes   Mild intermittent reactive airway disease without complication    Presents with a 7-day history of URI signs and symptoms that do not seem to be clearing.  He has been a little tight and wheezy in his chest.  Cough has been dry.  History of asthma as a kid that resolved into adulthood.  He does not smoke.  No fevers or chills.  He has felt pressure in his face and left upper teeth pain.  There is been no rhinorrhea but has experienced postnasal drip.   Review of Systems  Constitutional: Negative.  Negative for chills and fever.  HENT:  Positive for congestion and sinus pain.   Eyes:  Negative for blurred vision, discharge and redness.  Respiratory:  Positive for cough and wheezing. Negative for sputum production.   Cardiovascular: Negative.   Gastrointestinal:  Negative for abdominal pain.  Genitourinary: Negative.   Musculoskeletal: Negative.  Negative for joint pain and myalgias.  Skin:  Negative for rash.  Neurological:  Negative for tingling, loss of consciousness and weakness.  Endo/Heme/Allergies:  Negative for polydipsia.     Current Outpatient Medications:    Acetaminophen Extra Strength 500 MG CAPS, Take 2 capsules by mouth every 8 (eight) hours., Disp: , Rfl:    albuterol (VENTOLIN HFA) 108 (90 Base) MCG/ACT inhaler, Inhale 1-2 puffs into the lungs every 6 (six) hours as needed for wheezing or shortness of breath., Disp: 8 g, Rfl: 1   amLODipine  (NORVASC) 10 MG tablet, Take 1 tablet (10 mg total) by mouth every evening., Disp: 100 tablet, Rfl: 1   amoxicillin (AMOXIL) 875 MG tablet, Take 1 tablet (875 mg total) by mouth 2 (two) times daily for 10 days., Disp: 20 tablet, Rfl: 0   docusate sodium (COLACE) 100 MG capsule, Take 1 capsule (100 mg total) by mouth 2 (two) times daily as needed for mild constipation., Disp: 100 capsule, Rfl: 0   EPINEPHrine 0.3 mg/0.3 mL IJ SOAJ injection, Inject into the muscle., Disp: , Rfl:    fexofenadine (ALLEGRA) 180 MG tablet, Take 180 mg by mouth daily., Disp: , Rfl:    hydrochlorothiazide (HYDRODIURIL) 25 MG tablet, TAKE 1 TABLET BY MOUTH EVERY DAY IN THE MORNING, Disp: 90 tablet, Rfl: 0   Multiple Vitamin (MULTI-VITAMIN) tablet, Take by mouth., Disp: , Rfl:    Omega-3 Fatty Acids (FISH OIL) 300 MG CAPS, Take by mouth., Disp: , Rfl:    polyethylene glycol powder (GLYCOLAX/MIRALAX) 17 GM/SCOOP powder, Take 17 g by mouth 2 (two) times daily as needed., Disp: 3350 g, Rfl: 1   predniSONE (DELTASONE) 10 MG tablet, Take 1 tablet (10 mg total) by mouth daily with breakfast for 6 days., Disp: 6 tablet, Rfl: 0   vortioxetine HBr (TRINTELLIX) 5 MG TABS tablet, Take 1 tablet (5 mg total) by mouth daily., Disp: 30 tablet, Rfl: 2   phentermine (ADIPEX-P) 37.5 MG tablet, Take 37.5 mg by mouth every morning. (Patient not  taking: Reported on 10/22/2022), Disp: , Rfl:    Objective:     BP (!) 138/90 (BP Location: Right Arm, Patient Position: Sitting, Cuff Size: Large)   Pulse 60   Temp 97.7 F (36.5 C)   Ht 6\' 3"  (1.905 m)   Wt 259 lb (117.5 kg)   SpO2 98%   BMI 32.37 kg/m    Physical Exam Constitutional:      General: He is not in acute distress.    Appearance: Normal appearance. He is not ill-appearing, toxic-appearing or diaphoretic.  HENT:     Head: Normocephalic and atraumatic.     Right Ear: Tympanic membrane, ear canal and external ear normal.     Left Ear: Tympanic membrane, ear canal and external  ear normal.  Eyes:     General: No scleral icterus.       Right eye: No discharge.        Left eye: No discharge.     Extraocular Movements: Extraocular movements intact.     Conjunctiva/sclera: Conjunctivae normal.     Pupils: Pupils are equal, round, and reactive to light.  Cardiovascular:     Rate and Rhythm: Normal rate and regular rhythm.  Pulmonary:     Effort: Pulmonary effort is normal. No respiratory distress.     Breath sounds: Normal breath sounds. No wheezing, rhonchi or rales.  Musculoskeletal:     Cervical back: No rigidity or tenderness.  Lymphadenopathy:     Cervical: No cervical adenopathy.  Skin:    General: Skin is warm and dry.  Neurological:     Mental Status: He is alert and oriented to person, place, and time.  Psychiatric:        Mood and Affect: Mood normal.        Behavior: Behavior normal.      No results found for any visits on 10/22/22.    The 10-year ASCVD risk score (Arnett DK, et al., 2019) is: 10.8%    Assessment & Plan:   Acute non-recurrent maxillary sinusitis -     Amoxicillin; Take 1 tablet (875 mg total) by mouth 2 (two) times daily for 10 days.  Dispense: 20 tablet; Refill: 0  Mild intermittent reactive airway disease without complication -     predniSONE; Take 1 tablet (10 mg total) by mouth daily with breakfast for 6 days.  Dispense: 6 tablet; Refill: 0 -     Albuterol Sulfate HFA; Inhale 1-2 puffs into the lungs every 6 (six) hours as needed for wheezing or shortness of breath.  Dispense: 8 g; Refill: 1    Return if symptoms worsen or fail to improve.    Mliss Sax, MD

## 2022-10-28 LAB — AMB RESULTS CONSOLE CBG: Glucose: 108

## 2022-10-29 NOTE — Progress Notes (Signed)
Pt recommended to go to urgent care. Education done on how pt is to take medications. Please follow up on SDOH needs, unsure of documentation.

## 2022-11-23 ENCOUNTER — Other Ambulatory Visit: Payer: Self-pay | Admitting: Family Medicine

## 2022-11-23 DIAGNOSIS — I10 Essential (primary) hypertension: Secondary | ICD-10-CM

## 2022-12-15 ENCOUNTER — Encounter: Payer: Self-pay | Admitting: *Deleted

## 2022-12-15 NOTE — Progress Notes (Addendum)
Pt attended 10/28/22 screening event where his b/p was 182/92 and his blood sugar was 108. At the event, the pt did not document a PCP name and did not identify any SDOH insecurities. At the event, the event RN shared education of b/p medication support and recommended the pt go to urgent care for his current HTN: however, chart review does not indicate that pt did go to an urgent care where an appt would have been visible in [facility]. During the initial event f/u today, Health equity team member attempted to call pt, who answered phone but phone cut off after caller identified self. Chart review indicates pt has PCP Dr. Nadene Rubins at [facility] location, whom he saw on 10/22/22, when his b/p was 138/90. PCP documentation notes pt is on 2 b/p meds, one of which was refilled on 11/23/22. Chart review also demonstrates pt has future appt with his PCP on 02/16/23 and again on 06/11/23. In-basket message also sent to PCP office with 10/28/22 event results. No additional health equity team support indicated at this time.  12/15/2022 Dominic Sax, MD  Thanks, we will ask him to come in for evaluation.

## 2022-12-19 ENCOUNTER — Other Ambulatory Visit: Payer: Self-pay | Admitting: Family Medicine

## 2022-12-19 DIAGNOSIS — I10 Essential (primary) hypertension: Secondary | ICD-10-CM

## 2022-12-26 ENCOUNTER — Telehealth: Payer: Self-pay | Admitting: Family Medicine

## 2022-12-26 NOTE — Telephone Encounter (Signed)
error 

## 2023-01-14 DIAGNOSIS — H52223 Regular astigmatism, bilateral: Secondary | ICD-10-CM | POA: Diagnosis not present

## 2023-01-17 ENCOUNTER — Other Ambulatory Visit: Payer: Self-pay | Admitting: Family Medicine

## 2023-01-19 ENCOUNTER — Other Ambulatory Visit: Payer: Self-pay

## 2023-01-19 MED ORDER — VORTIOXETINE HBR 5 MG PO TABS: 5.0000 mg | ORAL_TABLET | Freq: Every day | ORAL | 2 refills | Status: DC

## 2023-02-16 ENCOUNTER — Ambulatory Visit: Payer: 59 | Admitting: Family Medicine

## 2023-03-13 ENCOUNTER — Ambulatory Visit: Payer: 59 | Admitting: Family Medicine

## 2023-03-17 ENCOUNTER — Telehealth: Payer: Self-pay | Admitting: Family Medicine

## 2023-03-17 NOTE — Telephone Encounter (Signed)
Pt came in for appt 12/20 but insurance was inactive and he could not afford out of pocket. Did not count as missed visit.

## 2023-03-30 ENCOUNTER — Emergency Department (HOSPITAL_COMMUNITY): Payer: 59

## 2023-03-30 ENCOUNTER — Other Ambulatory Visit: Payer: Self-pay

## 2023-03-30 ENCOUNTER — Encounter (HOSPITAL_COMMUNITY): Payer: Self-pay

## 2023-03-30 ENCOUNTER — Ambulatory Visit (HOSPITAL_COMMUNITY)
Admission: EM | Admit: 2023-03-30 | Discharge: 2023-03-30 | Disposition: A | Discharge status: Home / Self Care | Payer: No Typology Code available for payment source | Attending: Family Medicine | Admitting: Family Medicine

## 2023-03-30 ENCOUNTER — Emergency Department (HOSPITAL_COMMUNITY)
Admission: EM | Admit: 2023-03-30 | Discharge: 2023-03-30 | Disposition: A | Discharge status: Home / Self Care | Payer: 59 | Attending: Emergency Medicine | Admitting: Emergency Medicine

## 2023-03-30 DIAGNOSIS — Z79899 Other long term (current) drug therapy: Secondary | ICD-10-CM | POA: Insufficient documentation

## 2023-03-30 DIAGNOSIS — I10 Essential (primary) hypertension: Secondary | ICD-10-CM | POA: Diagnosis not present

## 2023-03-30 DIAGNOSIS — R0789 Other chest pain: Secondary | ICD-10-CM | POA: Diagnosis not present

## 2023-03-30 DIAGNOSIS — E669 Obesity, unspecified: Secondary | ICD-10-CM | POA: Diagnosis not present

## 2023-03-30 DIAGNOSIS — R079 Chest pain, unspecified: Secondary | ICD-10-CM

## 2023-03-30 LAB — BASIC METABOLIC PANEL
Anion gap: 8 (ref 5–15)
BUN: 14 mg/dL (ref 6–20)
CO2: 24 mmol/L (ref 22–32)
Calcium: 9 mg/dL (ref 8.9–10.3)
Chloride: 110 mmol/L (ref 98–111)
Creatinine, Ser: 1.2 mg/dL (ref 0.61–1.24)
GFR, Estimated: >60 mL/min (ref >60)
Glucose, Bld: 111 mg/dL — ABNORMAL HIGH (ref 70–99)
Potassium: 3.8 mmol/L (ref 3.5–5.1)
Sodium: 142 mmol/L (ref 135–145)

## 2023-03-30 LAB — CBC
HCT: 44 % (ref 39.0–52.0)
Hemoglobin: 14.9 g/dL (ref 13.0–17.0)
MCH: 27.5 pg (ref 26.0–34.0)
MCHC: 33.9 g/dL (ref 30.0–36.0)
MCV: 81.3 fL (ref 80.0–100.0)
Platelets: 207 10*3/uL (ref 150–400)
RBC: 5.41 MIL/uL (ref 4.22–5.81)
RDW: 13.6 % (ref 11.5–15.5)
WBC: 5.5 10*3/uL (ref 4.0–10.5)
nRBC: 0 % (ref 0.0–0.2)

## 2023-03-30 LAB — TROPONIN I (HIGH SENSITIVITY)
Troponin I (High Sensitivity): 3 ng/L (ref <18)
Troponin I (High Sensitivity): 3 ng/L (ref <18)

## 2023-03-30 NOTE — ED Triage Notes (Signed)
 Pt c/o lt sided chest pain radiating up lt side of neck for 2wks. States has some SOB and night sweats. Pts b/p is elevated today. States hasn't took his b/p meds since last Thursday. Denies taking any meds for pain.

## 2023-03-30 NOTE — Discharge Instructions (Signed)
 Patient will go to the emergency room for chest pain

## 2023-03-30 NOTE — ED Provider Notes (Signed)
 Patient seen briefly in triage.  Here for chest pain that is actually been going on for about 2 weeks but in the last couple of days it is worsened and radiating up into his left neck.  He also has some tingling in both hands, more his left.  He has had some shortness of breath and some night sweats.  Blood pressure is elevated today and he has not taken his hypertension medicines since January 2.  No fever or cough  Exam is normal.  EKG shows normal sinus rhythm without any ST segment changes.  I have asked him to proceed to the emergency room for urgent evaluation of the chest pain; he will go by private car.   Vonna Sharlet POUR, MD 03/30/23 [phone]

## 2023-03-30 NOTE — ED Triage Notes (Signed)
 Pt arrived via POV. C/o L sided chest pain that radiates up into neck for 2x weeks, and new onset SOB.  Pt is non-compliant with BP meds  AOx4

## 2023-03-30 NOTE — Discharge Instructions (Signed)
 Return for any problem.  ?

## 2023-03-30 NOTE — ED Provider Notes (Addendum)
 [place] EMERGENCY DEPARTMENT AT [facility] Provider Note   CSN: [id number] Arrival date & time: 03/30/23  9149     History  Chief Complaint  Patient presents with   Chest Pain    Dominic Joseph is a 43 y.o. male.  43 year old male with prior medical history as detailed below presents for evaluation.  Patient complains of intermittent left-sided chest discomfort.  This has been ongoing for 2 weeks.  Patient denies any specific inciting events or activities.  He reports that the discomfort comes and goes.  In the last sometimes up to several hours.  He denies any active chest pain currently.  He denies known cardiac history.  He denies associated nausea, vomiting, diaphoresis, shortness of breath, other complaint.  He reports prior cardiology workup  several years ago that did not demonstrate CAD.  CT coronary from 2022 demonstrated calcium score of 0.  The history is provided by the patient and medical records.       Home Medications Prior to Admission medications   Medication Sig Start Date End Date Taking? Authorizing Provider  Acetaminophen  Extra Strength 500 MG CAPS Take 2 capsules by mouth every 8 (eight) hours. 11/27/21   [provider]  albuterol  (VENTOLIN  HFA) 108 (90 Base) MCG/ACT inhaler Inhale 1-2 puffs into the lungs every 6 (six) hours as needed for wheezing or shortness of breath. 10/22/22   Berneta Elsie Sayre, MD  amLODipine  (NORVASC ) 10 MG tablet TAKE 1 TABLET BY MOUTH EVERY DAY IN THE EVENING 11/25/22   Berneta Elsie Sayre, MD  docusate sodium  (COLACE) 100 MG capsule Take 1 capsule (100 mg total) by mouth 2 (two) times daily as needed for mild constipation. 05/20/22   Berneta Elsie Sayre, MD  EPINEPHrine  0.3 mg/0.3 mL IJ SOAJ injection Inject into the muscle. 07/08/12   [provider]  fexofenadine (ALLEGRA) 180 MG tablet Take 180 mg by mouth daily. 11/15/20   [provider]  hydrochlorothiazide  (HYDRODIURIL ) 25 MG  tablet TAKE 1 TABLET BY MOUTH EVERY DAY IN THE MORNING 12/19/22   Berneta Elsie Sayre, MD  Multiple Vitamin (MULTI-VITAMIN) tablet Take by mouth. 11/30/18   [provider]  Omega-3 Fatty Acids (FISH OIL) 300 MG CAPS Take by mouth. 11/30/18   [provider]  phentermine (ADIPEX-P) 37.5 MG tablet Take 37.5 mg by mouth every morning. Patient not taking: Reported on 10/22/2022 04/07/22   [provider]  polyethylene glycol powder (GLYCOLAX /MIRALAX ) 17 GM/SCOOP powder Take 17 g by mouth 2 (two) times daily as needed. 05/20/22   Berneta Elsie Sayre, MD  vortioxetine  HBr (TRINTELLIX ) 5 MG TABS tablet Take 1 tablet (5 mg total) by mouth daily. 01/19/23   Berneta Elsie Sayre, MD  metoCLOPramide  (REGLAN ) 10 MG tablet Take 1 tablet (10 mg total) by mouth 2 (two) times daily as needed (headache). Patient not taking: Reported on 10/07/2016 01/02/14 06/04/19  Carlota Day, MD      Allergies    Patient has no known allergies.    Review of Systems   Review of Systems  All other systems reviewed and are negative.   Physical Exam Updated Vital Signs BP (!) 163/101   Pulse 63   Temp 98.1 F (36.7 C)   Resp 13   Ht 6' 3 (1.905 m)   Wt 120.2 kg   SpO2 100%   BMI 33.12 kg/m  Physical Exam Vitals and nursing note reviewed.  Constitutional:      General: He is not in acute distress.  Appearance: Normal appearance. He is well-developed.  HENT:     Head: Normocephalic and atraumatic.  Eyes:     Conjunctiva/sclera: Conjunctivae normal.     Pupils: Pupils are equal, round, and reactive to light.  Cardiovascular:     Rate and Rhythm: Normal rate and regular rhythm.     Heart sounds: Normal heart sounds.  Pulmonary:     Effort: Pulmonary effort is normal. No respiratory distress.     Breath sounds: Normal breath sounds.  Abdominal:     General: There is no distension.     Palpations: Abdomen is soft.     Tenderness: There is no abdominal tenderness.   Musculoskeletal:        General: No deformity. Normal range of motion.     Cervical back: Normal range of motion and neck supple.  Skin:    General: Skin is warm and dry.  Neurological:     General: No focal deficit present.     Mental Status: He is alert and oriented to person, place, and time.     ED Results / Procedures / Treatments   Labs (all labs ordered are listed, but only abnormal results are displayed) Labs Reviewed  BASIC METABOLIC PANEL - Abnormal; Notable for the following components:      Result Value   Glucose, Bld 111 (*)    All other components within normal limits  CBC  TROPONIN I (HIGH SENSITIVITY)    EKG None  Radiology DG Chest 2 View Result Date: 03/30/2023 CLINICAL DATA:  Chest pain. EXAM: CHEST - 2 VIEW COMPARISON:  09/03/2016. FINDINGS: Bilateral lung fields are clear. Bilateral costophrenic angles are clear. Normal cardio-mediastinal silhouette. No acute osseous abnormalities. The soft tissues are within normal limits. IMPRESSION: No active cardiopulmonary disease. Electronically Signed   By: Ree Molt M.D.   On: 03/30/2023 09:44    Procedures Procedures    Medications Ordered in ED Medications - No data to display  ED Course/ Medical Decision Making/ A&P                                 Medical Decision Making Amount and/or Complexity of Data Reviewed Labs: ordered. Radiology: ordered.    Medical Screen Complete  This patient presented to the ED with complaint of chest discomfort.  This complaint involves an extensive number of treatment options. The initial differential diagnosis includes, but is not limited to, ACS, unstable angina, sarcoidosis, metabolic abnormality, etc.  This presentation is: Acute, Chronic, Self-Limited, Previously Undiagnosed, Uncertain Prognosis, Complicated, Systemic Symptoms, and Threat to Life/Bodily Function  Patient is presenting with describe symptoms that are not typical for likely ACS.  Obtain  EKG is without evidence of acute ischemia.  Troponin x 2 is without significant elevation or delta.  Patient's other screening labs and imaging are without significant abnormality.  Patient feels improved after ED evaluation.  He does have plan to follow-up closely with outpatient cardiology and pulmonology.  Importance of close follow-up is stressed.  Strict return precautions given and understood.  Given distant history of possible sarcoidosis offered CT imaging of the lungs to evaluate for same.  Patient declines this at this time.  He prefers to follow-up with his outpatient care providers.  Additional history obtained:  External records from outside sources obtained and reviewed including prior ED visits and prior Inpatient records.    Lab Tests:  I ordered and personally interpreted labs.  The  pertinent results include: CBC, BMP, troponin x 2   Imaging Studies ordered:  I ordered imaging studies including chest x-ray I independently visualized and interpreted obtained imaging which showed NAD I agree with the radiologist interpretation.   Cardiac Monitoring:  The patient was maintained on a cardiac monitor.  I personally viewed and interpreted the cardiac monitor which showed an underlying rhythm of: NSR   Problem List / ED Course:  Atypical chest pain   Reevaluation:  After the interventions noted above, I reevaluated the patient and found that they have: improved Disposition:  After consideration of the diagnostic results and the patients response to treatment, I feel that the patent would benefit from close outpatient followup.          Final Clinical Impression(s) / ED Diagnoses Final diagnoses:  Nonspecific chest pain  Hypertension, unspecified type    Rx / DC Orders ED Discharge Orders     None         Laurice Maude BROCKS, MD 03/30/23 1448    Laurice Maude BROCKS, MD 03/30/23 [phone]

## 2023-03-30 NOTE — ED Notes (Signed)
 Patient is being discharged from the Urgent Care and sent to the Emergency Department via POV . Per Dr. Vonna, patient is in need of higher level of care due to Chest pain. Patient is aware and verbalizes understanding of plan of care.  Vitals:   03/30/23 0823  BP: (!) 178/101  Pulse: 63  Resp: 18  Temp: 98 F (36.7 C)  SpO2: 96%

## 2023-03-30 NOTE — ED Provider Triage Note (Signed)
 Emergency Medicine Provider Triage Evaluation Note  Dominic Joseph , a 43 y.o. male  was evaluated in triage.  Pt complains of chest pain.  Review of Systems  Positive:  Negative:   Physical Exam  BP (!) 163/101   Pulse 63   Temp 98.1 F (36.7 C)   Resp 13   Ht 6' 3 (1.905 m)   Wt 120.2 kg   SpO2 100%   BMI 33.12 kg/m  Gen:   Awake, no distress   Resp:  Normal effort  MSK:   Moves extremities without difficulty  Other:    Medical Decision Making  Medically screening exam initiated at 9:19 AM.  Appropriate orders placed.  Dominic Joseph was informed that the remainder of the evaluation will be completed by another provider, this initial triage assessment does not replace that evaluation, and the importance of remaining in the ED until their evaluation is complete.  Per UC note: Here for chest pain that is actually been going on for about 2 weeks but in the last couple of days it is worsened and radiating up into his left neck.  He also has some tingling in both hands, more his left.  He has had some shortness of breath and some night sweats.  Blood pressure is elevated today and he has not taken his hypertension medicines since January 2. No fever or cough.  Patient denies any other complaint. Patient stating that his SOB is not associated with exertion vs positioning vs rest.   Dominic Joseph, [place] 03/30/23 0920

## 2023-04-06 ENCOUNTER — Telehealth: Payer: Self-pay | Admitting: *Deleted

## 2023-04-06 NOTE — Progress Notes (Signed)
 Transition Care Management Unsuccessful Follow-up Telephone Call  Date of discharge and from where:  [facility]  03/30/2023  Attempts:  1st Attempt  Reason for unsuccessful TCM follow-up call:  Left voice message

## 2023-04-08 ENCOUNTER — Telehealth: Payer: Self-pay | Admitting: *Deleted

## 2023-04-08 NOTE — Progress Notes (Signed)
 Transition Care Management Unsuccessful Follow-up Telephone Call  Date of discharge and from where:  [facility]  03/30/2023  Attempts:  2nd Attempt  Reason for unsuccessful TCM follow-up call:  No answer/busy

## 2023-04-10 ENCOUNTER — Telehealth: Payer: Self-pay | Admitting: Family Medicine

## 2023-04-10 NOTE — Telephone Encounter (Signed)
 ERROR

## 2023-04-14 ENCOUNTER — Encounter: Payer: Self-pay | Admitting: Family Medicine

## 2023-04-14 ENCOUNTER — Ambulatory Visit: Payer: No Typology Code available for payment source | Admitting: Family Medicine

## 2023-04-14 VITALS — BP 147/91 | HR 60 | Temp 98.7°F | Ht 75.0 in | Wt 280.6 lb

## 2023-04-14 DIAGNOSIS — I10 Essential (primary) hypertension: Secondary | ICD-10-CM

## 2023-04-14 DIAGNOSIS — F322 Major depressive disorder, single episode, severe without psychotic features: Secondary | ICD-10-CM

## 2023-04-14 MED ORDER — TRIAMTERENE-HCTZ 37.5-25 MG PO CAPS: 1.0000 | ORAL_CAPSULE | Freq: Every day | ORAL | 1 refills | Status: DC

## 2023-04-14 MED ORDER — HYDROCHLOROTHIAZIDE 25 MG PO TABS: ORAL_TABLET | ORAL | 2 refills | Status: DC

## 2023-04-14 MED ORDER — AMLODIPINE BESYLATE 10 MG PO TABS: 10.0000 mg | ORAL_TABLET | Freq: Every day | ORAL | 2 refills | Status: AC

## 2023-04-14 NOTE — Progress Notes (Signed)
Established Patient Office Visit   Subjective:  Patient ID: Dominic Joseph, male    DOB: January 01, 1981  Age: 43 y.o. MRN: [id number]  Chief Complaint  Patient presents with   Hypertension    Hypertension Pertinent negatives include no blurred vision.   Encounter Diagnoses  Name Primary?   Essential hypertension Yes   Severe depression (HCC)    Benign hypertension    For follow-up of above.  Continues with amlodipine 10 mg and HCTZ 25 mg.  This is a stressful time of his life.  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  He rarely drinks alcohol.  He rarely smokes a cigar.  He has been working out regularly.  His blood pressure at home is running in the 140s to 150s over 80s to 90s.  He request a psychiatry referral.  Seen recently in the emergency room for atypical chest pain.  ACS was ruled out.  Blood work was essentially normal.   Review of Systems  Constitutional: Negative.   HENT: Negative.    Eyes:  Negative for blurred vision, discharge and redness.  Respiratory: Negative.    Cardiovascular: Negative.   Gastrointestinal:  Negative for abdominal pain.  Genitourinary: Negative.   Musculoskeletal: Negative.  Negative for myalgias.  Skin:  Negative for rash.  Neurological:  Negative for tingling, loss of consciousness and weakness.  Endo/Heme/Allergies:  Negative for polydipsia.      04/14/2023    3:27 PM 08/15/2022    3:03 PM 06/09/2022   10:30 AM  Depression screen PHQ 2/9  Decreased Interest 0 0 0  Down, Depressed, Hopeless 0 0 0  PHQ - 2 Score 0 0 0  Altered sleeping   0  Tired, decreased energy   2  Change in appetite   1  Feeling bad or failure about yourself    0  Trouble concentrating   2  Moving slowly or fidgety/restless   2  Suicidal thoughts   0  PHQ-9 Score   7  Difficult doing work/chores   Somewhat difficult       Current  Outpatient Medications:    Acetaminophen Extra Strength 500 MG CAPS, Take 2 capsules by mouth every 8 (eight) hours., Disp: , Rfl:    albuterol (VENTOLIN HFA) 108 (90 Base) MCG/ACT inhaler, Inhale 1-2 puffs into the lungs every 6 (six) hours as needed for wheezing or shortness of breath., Disp: 8 g, Rfl: 1   docusate sodium (COLACE) 100 MG capsule, Take 1 capsule (100 mg total) by mouth 2 (two) times daily as needed for mild constipation., Disp: 100 capsule, Rfl: 0   EPINEPHrine 0.3 mg/0.3 mL IJ SOAJ injection, Inject into the muscle., Disp: , Rfl:    fexofenadine (ALLEGRA) 180 MG tablet, Take 180 mg by mouth daily., Disp: , Rfl:    Multiple Vitamin (MULTI-VITAMIN) tablet, Take by mouth., Disp: , Rfl:    Omega-3 Fatty Acids (FISH OIL) 300 MG CAPS, Take by mouth., Disp: , Rfl:    polyethylene glycol powder (GLYCOLAX/MIRALAX) 17 GM/SCOOP powder, Take 17 g by mouth 2 (two) times daily as needed., Disp: 3350 g, Rfl: 1   triamterene-hydrochlorothiazide (DYAZIDE) 37.5-25 MG capsule, Take 1 each (1 capsule total) by mouth daily., Disp: 90 capsule, Rfl: 1   vortioxetine HBr (TRINTELLIX) 5 MG TABS tablet, Take 1 tablet (5 mg total) by mouth daily., Disp: 30  tablet, Rfl: 2   amLODipine (NORVASC) 10 MG tablet, Take 1 tablet (10 mg total) by mouth daily., Disp: 90 tablet, Rfl: 2   phentermine (ADIPEX-P) 37.5 MG tablet, Take 37.5 mg by mouth every morning. (Patient not taking: Reported on 04/14/2023), Disp: , Rfl:    Objective:     BP (!) 147/91   Pulse 60   Temp 98.7 F (37.1 C)   Ht 6\' 3"  (1.905 m)   Wt 280 lb 9.6 oz (127.3 kg)   SpO2 99%   BMI 35.07 kg/m  BP Readings from Last 3 Encounters:  04/14/23 (!) 147/91  03/30/23 (!) 149/100  03/30/23 (!) 178/101   Wt Readings from Last 3 Encounters:  04/14/23 280 lb 9.6 oz (127.3 kg)  03/30/23 265 lb (120.2 kg)  10/22/22 259 lb (117.5 kg)      Physical Exam Constitutional:      General: He is not in acute distress.    Appearance: Normal  appearance. He is not ill-appearing, toxic-appearing or diaphoretic.  HENT:     Head: Normocephalic and atraumatic.     Right Ear: External ear normal.     Left Ear: External ear normal.     Mouth/Throat:     Mouth: Mucous membranes are moist.     Pharynx: Oropharynx is clear. No oropharyngeal exudate or posterior oropharyngeal erythema.  Eyes:     General: No scleral icterus.       Right eye: No discharge.        Left eye: No discharge.     Extraocular Movements: Extraocular movements intact.     Conjunctiva/sclera: Conjunctivae normal.     Pupils: Pupils are equal, round, and reactive to light.  Cardiovascular:     Rate and Rhythm: Normal rate and regular rhythm.  Pulmonary:     Effort: Pulmonary effort is normal. No respiratory distress.     Breath sounds: Normal breath sounds.  Musculoskeletal:     Cervical back: No rigidity or tenderness.  Skin:    General: Skin is warm and dry.  Neurological:     Mental Status: He is alert and oriented to person, place, and time.  Psychiatric:        Mood and Affect: Mood normal.        Behavior: Behavior normal.      No results found for any visits on 04/14/23.    The 10-year ASCVD risk score (Arnett DK, et al., 2019) is: 12.2%    Assessment & Plan:   Essential hypertension -     amLODIPine Besylate; Take 1 tablet (10 mg total) by mouth daily.  Dispense: 90 tablet; Refill: 2  Severe depression (HCC) -     Ambulatory referral to Psychiatry  Benign hypertension  Other orders -     Triamterene-HCTZ; Take 1 each (1 capsule total) by mouth daily.  Dispense: 90 capsule; Refill: 1    Return in about 8 weeks (around 06/09/2023).  Continue amlodipine.  Discontinue HCTZ.  Start Dyazide.  Information given on managing hypertension.  Discussed reducing sodium in the diet.  Continue counseling with therapist.  Referral to psychiatry per patient request.  Would have to consider PTSD.   Mliss Sax, MD

## 2023-04-15 NOTE — Progress Notes (Unsigned)
   There were no vitals taken for this visit.  There is no height or weight on file to calculate BMI.  No chief complaint on file.   No diagnosis found.  DOI/DOS/ Date: NA  {CHL AMB ORT SYMPTOMS POST TREATMENT:21798}

## 2023-04-16 ENCOUNTER — Encounter: Payer: Self-pay | Admitting: Orthopedic Surgery

## 2023-04-16 ENCOUNTER — Ambulatory Visit: Payer: No Typology Code available for payment source | Admitting: Orthopedic Surgery

## 2023-04-16 ENCOUNTER — Other Ambulatory Visit (INDEPENDENT_AMBULATORY_CARE_PROVIDER_SITE_OTHER): Payer: Self-pay

## 2023-04-16 VITALS — BP 143/92 | HR 60 | Ht 75.0 in | Wt 280.0 lb

## 2023-04-16 DIAGNOSIS — R202 Paresthesia of skin: Secondary | ICD-10-CM | POA: Diagnosis not present

## 2023-04-16 DIAGNOSIS — M545 Low back pain, unspecified: Secondary | ICD-10-CM

## 2023-04-16 DIAGNOSIS — M50322 Other cervical disc degeneration at C5-C6 level: Secondary | ICD-10-CM

## 2023-04-16 NOTE — Progress Notes (Signed)
Follow-up visit  Encounter Diagnoses  Name Primary?   Lumbar pain Yes   Right hand paresthesia    Degeneration of C5-C6 intervertebral disc     BP (!) 143/92   Pulse 60   Ht 6\' 3"  (1.905 m)   Wt 280 lb (127 kg)   BMI 35.00 kg/m   Body mass index is 35 kg/m.       Chief Complaint  Patient presents with   Hip Pain      Left       No diagnosis found.   DOI/DOS/ Date: NA   Unchanged, continued pain left hip  Reevaluation  Pain is over the lateral side of the hip and in the crease of the hip but no evidence of groin pain no complaints of groin pain  Reexamination reveals tenderness in the lower back primarily on the left side with no midline tenderness mild tenderness across the anterior superior crest.  Hip flexion internal/external rotation reproduces no groin pain  Imaging was done of the spine  DG Lumbar Spine 2-3 Views Result Date: 04/16/2023 Lumbar imaging Done for hip versus back pain X-ray shows flattening of the lumbar spine normal alignment otherwise good disc spaces. Impression flatback no disc or spondylolysis or listhesis    We also discussed the patient's numbness tips of his thumb and index finger.  He also plays the piano.  He has had a carpal tunnel test in the past and says this is much different than the symptoms he had back when he was in college.  He has decreased sensation in the thumb and index finger without weakness  Recommend nerve conduction study to diagnosis as either C6 radicular symptoms or recurrent carpal tunnel symptoms  PT is the best treatment for his spine  Follow-up 6 weeks

## 2023-04-16 NOTE — Patient Instructions (Addendum)
Physical therapy has been ordered for you at [facility] they will call you with appointment  Nerve study will also be there  We are referring you to [facility] from [facility] address is [address] [place] The phone number is [phone]  The office will call you with an appointment Dr. Alvester Morin will do the nerve study

## 2023-04-20 ENCOUNTER — Ambulatory Visit: Payer: No Typology Code available for payment source | Admitting: Orthopedic Surgery

## 2023-04-27 ENCOUNTER — Telehealth: Payer: Self-pay

## 2023-04-27 NOTE — Telephone Encounter (Signed)
Patient left a VM stating that he was returning a call.  Please advise.  Thank You.

## 2023-04-30 DIAGNOSIS — E669 Obesity, unspecified: Secondary | ICD-10-CM | POA: Diagnosis not present

## 2023-05-01 ENCOUNTER — Encounter: Payer: Self-pay | Admitting: Physical Medicine and Rehabilitation

## 2023-05-01 ENCOUNTER — Ambulatory Visit (INDEPENDENT_AMBULATORY_CARE_PROVIDER_SITE_OTHER): Payer: No Typology Code available for payment source | Admitting: Physical Medicine and Rehabilitation

## 2023-05-01 DIAGNOSIS — R200 Anesthesia of skin: Secondary | ICD-10-CM

## 2023-05-01 DIAGNOSIS — R202 Paresthesia of skin: Secondary | ICD-10-CM

## 2023-05-01 DIAGNOSIS — R29898 Other symptoms and signs involving the musculoskeletal system: Secondary | ICD-10-CM

## 2023-05-01 NOTE — Progress Notes (Signed)
RUE NCS Patient states he is not having pain, only numbness in finger tips. He has noticed Difficultly grasping and dropping things.  It can be very annoying.

## 2023-05-01 NOTE — Progress Notes (Signed)
 Dominic Joseph - 43 y.o. male MRN [id number]  Date of birth: 1980/05/27  Office Visit Note: Visit Date: 05/01/2023 PCP: Berneta Elsie Sayre, MD Referred by: Margrette Taft BRAVO, MD  Subjective: Chief Complaint  Patient presents with   Right Hand - Numbness   HPI: Dominic Joseph is a 43 y.o. male who comes in today at the request of Dr. Taft Margrette for evaluation and management of chronic, worsening and severe pain, numbness and tingling in the Right upper extremities.  Patient is Right hand dominant.  He reports several months now of worsening severe numbness in the right hand with focal weakness.  He reports dropping things now without even really trying.  He denies any symptoms really on the left.  Most of the symptoms are numbness in the fingertips of the index finger and thumb on the right.  He does endorse some neck pain but no frank radicular symptoms down the arm.  He reports a prior history of what he was told was carpal tunnel syndrome when he was in college.  He feels like this is different symptoms than he had at that point.  He did have a nerve test at that time we have no results of that it was many years ago.  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  He is not diabetic and denies thyroid  disease.   I spent more than 30 minutes speaking face-to-face with the patient with 50% of the time in counseling and discussing coordination of care.       Review of Systems  Musculoskeletal:  Positive for back pain and joint pain.  Neurological:  Positive for tingling and focal weakness.  All other systems reviewed and are negative.  Otherwise per HPI.  Assessment & Plan: Visit Diagnoses:    ICD-10-CM   1. Paresthesia of skin  R20.2 NCV with EMG (electromyography)    2. Right hand weakness  R29.898     3. Numbness and tingling in right hand  R20.0    R20.2        Plan: Impression: Clinically seems to  be consistent with more carpal tunnel syndrome than C6 radiculopathy but cannot rule out double crush phenomenon.  He has not had any cervical MRI or advanced imaging.  Electrodiagnostic study performed today.  The above electrodiagnostic study is ABNORMAL and reveals evidence of a moderate right median nerve entrapment at the wrist (carpal tunnel syndrome) affecting sensory and motor components.   There is no significant electrodiagnostic evidence of any other focal nerve entrapment, brachial plexopathy or cervical radiculopathy.  As you know, this particular electrodiagnostic study cannot rule out chemical radiculitis or sensory only radiculopathy.  Recommendations: 1.  Follow-up with referring physician. 2.  Continue current management of symptoms. 3.  Continue use of resting splint at night-time and as needed during the day.  Meds & Orders: No orders of the defined types were placed in this encounter.   Orders Placed This Encounter  Procedures   NCV with EMG (electromyography)    Follow-up: Return for Taft Margrette, MD.   Procedures: No procedures performed  EMG & NCV Findings: Evaluation of the right median motor nerve showed prolonged distal onset latency (4.4 ms).  The right median (across palm) sensory nerve showed prolonged distal peak latency (4.0 ms).  All remaining nerves (as indicated in the following tables) were within normal limits.    All examined  muscles (as indicated in the following table) showed no evidence of electrical instability.    Impression: The above electrodiagnostic study is ABNORMAL and reveals evidence of a moderate right median nerve entrapment at the wrist (carpal tunnel syndrome) affecting sensory and motor components.   There is no significant electrodiagnostic evidence of any other focal nerve entrapment, brachial plexopathy or cervical radiculopathy.  As you know, this particular electrodiagnostic study cannot rule out chemical radiculitis or  sensory only radiculopathy.  Recommendations: 1.  Follow-up with referring physician. 2.  Continue current management of symptoms. 3.  Continue use of resting splint at night-time and as needed during the day.  ___________________________ Prentice Eldonna BETTERS Board Certified, American Board of Physical Medicine and Rehabilitation    Nerve Conduction Studies Anti Sensory Summary Table   Stim Site NR Peak (ms) Norm Peak (ms) P-T Amp (V) Norm P-T Amp Site1 Site2 Delta-P (ms) Dist (cm) Vel (m/s) Norm Vel (m/s)  Right Median Acr Palm Anti Sensory (2nd Digit)  30.9C  Wrist    *4.0 <3.6 28.6 >10        Site 3    1.9  8.9         Right Radial Anti Sensory (Base 1st Digit)  31C  Wrist    2.1 <3.1 20.8  Wrist Base 1st Digit 2.1 0.0    Right Ulnar Anti Sensory (5th Digit)  31.2C  Wrist    3.4 <3.7 18.2 >15.0 Wrist 5th Digit 3.4 14.0 41 >38   Motor Summary Table   Stim Site NR Onset (ms) Norm Onset (ms) O-P Amp (mV) Norm O-P Amp Site1 Site2 Delta-0 (ms) Dist (cm) Vel (m/s) Norm Vel (m/s)  Right Median Motor (Abd Poll Brev)  31.3C  Wrist    *4.4 <4.2 10.9 >5 Elbow Wrist 4.7 25.0 53 >50  Elbow    9.1  10.9         Right Ulnar Motor (Abd Dig Min)  31.3C  Wrist    3.0 <4.2 7.3 >3 B Elbow Wrist 5.1 27.0 53 >53  B Elbow    8.1  7.0  A Elbow B Elbow 2.0 11.0 55 >53  A Elbow    10.1  7.4          EMG   Side Muscle Nerve Root Ins Act Fibs Psw Amp Dur Poly Recrt Int Bruna Comment  Right Abd Poll Brev Median C8-T1 Nml Nml Nml Nml Nml 0 Nml Nml   Right 1stDorInt Ulnar C8-T1 Nml Nml Nml Nml Nml 0 Nml Nml   Right PronatorTeres Median C6-7 Nml Nml Nml Nml Nml 0 Nml Nml   Right Biceps Musculocut C5-6 Nml Nml Nml Nml Nml 0 Nml Nml   Right Deltoid Axillary C5-6 Nml Nml Nml Nml Nml 0 Nml Nml     Nerve Conduction Studies Anti Sensory Left/Right Comparison   Stim Site L Lat (ms) R Lat (ms) L-R Lat (ms) L Amp (V) R Amp (V) L-R Amp (%) Site1 Site2 L Vel (m/s) R Vel (m/s) L-R Vel (m/s)  Median Acr  Palm Anti Sensory (2nd Digit)  30.9C  Wrist  *4.0   28.6        Site 3  1.9   8.9        Radial Anti Sensory (Base 1st Digit)  31C  Wrist  2.1   20.8  Wrist Base 1st Digit     Ulnar Anti Sensory (5th Digit)  31.2C  Wrist  3.4   18.2  Wrist  5th Digit  41    Motor Left/Right Comparison   Stim Site L Lat (ms) R Lat (ms) L-R Lat (ms) L Amp (mV) R Amp (mV) L-R Amp (%) Site1 Site2 L Vel (m/s) R Vel (m/s) L-R Vel (m/s)  Median Motor (Abd Poll Brev)  31.3C  Wrist  *4.4   10.9  Elbow Wrist  53   Elbow  9.1   10.9        Ulnar Motor (Abd Dig Min)  31.3C  Wrist  3.0   7.3  B Elbow Wrist  53   B Elbow  8.1   7.0  A Elbow B Elbow  55   A Elbow  10.1   7.4           Waveforms:            Clinical History: No specialty comments available.   He reports that he has been smoking cigars. He has never used smokeless tobacco. No results for input(s): HGBA1C, LABURIC in the last 8760 hours.  Objective:  VS:  HT:    WT:   BMI:     BP:   HR: bpm  TEMP: ( )  RESP:  Physical Exam Vitals and nursing note reviewed.  Constitutional:      General: He is not in acute distress.    Appearance: Normal appearance. He is well-developed.  HENT:     Head: Normocephalic and atraumatic.  Eyes:     Conjunctiva/sclera: Conjunctivae normal.     Pupils: Pupils are equal, round, and reactive to light.  Cardiovascular:     Rate and Rhythm: Normal rate.     Pulses: Normal pulses.     Heart sounds: Normal heart sounds.  Pulmonary:     Effort: Pulmonary effort is normal. No respiratory distress.  Musculoskeletal:        General: Tenderness present.     Cervical back: Normal range of motion and neck supple. No rigidity.     Right lower leg: No edema.     Left lower leg: No edema.     Comments: Inspection reveals no atrophy of the bilateral APB or FDI or hand intrinsics. There is no swelling, color changes, allodynia or dystrophic changes. There is 5 out of 5 strength in the bilateral wrist  extension, finger abduction and long finger flexion.  There is subjective impaired sensation in the index and thumb particularly in the fingertips.. There is a negative Froment's test bilaterally. There is a negative Tinel's test at the bilateral wrist and elbow. There is a negative Phalen's test bilaterally. There is a negative Hoffmann's test bilaterally.  Skin:    General: Skin is warm and dry.     Findings: No erythema or rash.  Neurological:     General: No focal deficit present.     Mental Status: He is alert and oriented to person, place, and time.     Cranial Nerves: No cranial nerve deficit.     Sensory: Sensory deficit present.     Motor: No weakness or abnormal muscle tone.     Coordination: Coordination normal.     Gait: Gait normal.  Psychiatric:        Mood and Affect: Mood normal.        Behavior: Behavior normal.        Thought Content: Thought content normal.     Ortho Exam  Imaging: No results found.  Past Medical/Family/Surgical/Social History: Medications & Allergies reviewed per EMR, new medications updated.  Patient Active Problem List   Diagnosis Date Noted   Neutropenia (HCC) 08/15/2022   Family history of prostate cancer 06/09/2022   Healthcare maintenance 06/09/2022   Visit for suture removal 05/20/2022   Slow transit constipation 05/20/2022   Benign prostatic hyperplasia with urinary hesitancy 05/20/2022   Syncope and collapse    Cluster headaches 07/07/2019   Essential hypertension 07/07/2019   Fatigue 07/07/2019   ADD (attention deficit disorder) 07/07/2019   Severe depression (HCC) 07/07/2019   IBS (irritable colon syndrome) 07/07/2019   Elevated serum creatinine 07/07/2019   Chronic pansinusitis 11/30/2018   Perennial allergic rhinitis 11/30/2018   Tobacco use disorder 01/29/2012   Environmental allergies 01/28/2012   Plantar fascial fibromatosis 12/23/2011   Asthma 07/25/2011   Sarcoidosis 07/25/2011   Pain in joint involving lower leg  06/10/2011   Primary localized osteoarthrosis, lower leg 06/10/2011   Past Medical History:  Diagnosis Date   Depression    Environmental allergies    Hyperlipidemia    Hypertension    Sarcoidosis of lung (HCC)    Syncope and collapse    Family History  Problem Relation Age of Onset   Hypertension Mother    Cardiomyopathy Father    Hypertension Sister    Hypertension Sister    Hypertension Brother    Cardiomyopathy Brother    Hyperlipidemia Maternal Grandmother    Hypertension Maternal Grandmother    Stroke Maternal Grandmother    Stroke Maternal Grandfather    Past Surgical History:  Procedure Laterality Date   LUNG BIOPSY Left    SHOULDER SURGERY Right    SHOULDER SURGERY Left 11/27/2021   WISDOM TOOTH EXTRACTION     Social History   Occupational History   Occupation: Emergency planning/management officer  Tobacco Use   Smoking status: Some Days    Types: Cigars   Smokeless tobacco: Never  Vaping Use   Vaping status: Never Used  Substance and Sexual Activity   Alcohol use: Yes    Comment: occ   Drug use: Not Currently    Types: Marijuana    Comment: in high school   Sexual activity: Yes

## 2023-05-02 NOTE — Procedures (Signed)
 EMG & NCV Findings: Evaluation of the right median motor nerve showed prolonged distal onset latency (4.4 ms).  The right median (across palm) sensory nerve showed prolonged distal peak latency (4.0 ms).  All remaining nerves (as indicated in the following tables) were within normal limits.    All examined muscles (as indicated in the following table) showed no evidence of electrical instability.    Impression: The above electrodiagnostic study is ABNORMAL and reveals evidence of a moderate right median nerve entrapment at the wrist (carpal tunnel syndrome) affecting sensory and motor components.   There is no significant electrodiagnostic evidence of any other focal nerve entrapment, brachial plexopathy or cervical radiculopathy.  As you know, this particular electrodiagnostic study cannot rule out chemical radiculitis or sensory only radiculopathy.  Recommendations: 1.  Follow-up with referring physician. 2.  Continue current management of symptoms. 3.  Continue use of resting splint at night-time and as needed during the day.  ___________________________ Dominic Joseph Board Certified, American Board of Physical Medicine and Rehabilitation    Nerve Conduction Studies Anti Sensory Summary Table   Stim Site NR Peak (ms) Norm Peak (ms) P-T Amp (V) Norm P-T Amp Site1 Site2 Delta-P (ms) Dist (cm) Vel (m/s) Norm Vel (m/s)  Right Median Acr Palm Anti Sensory (2nd Digit)  30.9C  Wrist    *4.0 <3.6 28.6 >10        Site 3    1.9  8.9         Right Radial Anti Sensory (Base 1st Digit)  31C  Wrist    2.1 <3.1 20.8  Wrist Base 1st Digit 2.1 0.0    Right Ulnar Anti Sensory (5th Digit)  31.2C  Wrist    3.4 <3.7 18.2 >15.0 Wrist 5th Digit 3.4 14.0 41 >38   Motor Summary Table   Stim Site NR Onset (ms) Norm Onset (ms) O-P Amp (mV) Norm O-P Amp Site1 Site2 Delta-0 (ms) Dist (cm) Vel (m/s) Norm Vel (m/s)  Right Median Motor (Abd Poll Brev)  31.3C  Wrist    *4.4 <4.2 10.9 >5 Elbow Wrist  4.7 25.0 53 >50  Elbow    9.1  10.9         Right Ulnar Motor (Abd Dig Min)  31.3C  Wrist    3.0 <4.2 7.3 >3 B Elbow Wrist 5.1 27.0 53 >53  B Elbow    8.1  7.0  A Elbow B Elbow 2.0 11.0 55 >53  A Elbow    10.1  7.4          EMG   Side Muscle Nerve Root Ins Act Fibs Psw Amp Dur Poly Recrt Int Bruna Comment  Right Abd Poll Brev Median C8-T1 Nml Nml Nml Nml Nml 0 Nml Nml   Right 1stDorInt Ulnar C8-T1 Nml Nml Nml Nml Nml 0 Nml Nml   Right PronatorTeres Median C6-7 Nml Nml Nml Nml Nml 0 Nml Nml   Right Biceps Musculocut C5-6 Nml Nml Nml Nml Nml 0 Nml Nml   Right Deltoid Axillary C5-6 Nml Nml Nml Nml Nml 0 Nml Nml     Nerve Conduction Studies Anti Sensory Left/Right Comparison   Stim Site L Lat (ms) R Lat (ms) L-R Lat (ms) L Amp (V) R Amp (V) L-R Amp (%) Site1 Site2 L Vel (m/s) R Vel (m/s) L-R Vel (m/s)  Median Acr Palm Anti Sensory (2nd Digit)  30.9C  Wrist  *4.0   28.6        Site 3  1.9   8.9        Radial Anti Sensory (Base 1st Digit)  31C  Wrist  2.1   20.8  Wrist Base 1st Digit     Ulnar Anti Sensory (5th Digit)  31.2C  Wrist  3.4   18.2  Wrist 5th Digit  41    Motor Left/Right Comparison   Stim Site L Lat (ms) R Lat (ms) L-R Lat (ms) L Amp (mV) R Amp (mV) L-R Amp (%) Site1 Site2 L Vel (m/s) R Vel (m/s) L-R Vel (m/s)  Median Motor (Abd Poll Brev)  31.3C  Wrist  *4.4   10.9  Elbow Wrist  53   Elbow  9.1   10.9        Ulnar Motor (Abd Dig Min)  31.3C  Wrist  3.0   7.3  B Elbow Wrist  53   B Elbow  8.1   7.0  A Elbow B Elbow  55   A Elbow  10.1   7.4           Waveforms:

## 2023-05-05 ENCOUNTER — Encounter: Payer: Self-pay | Admitting: Physical Therapy

## 2023-05-05 ENCOUNTER — Other Ambulatory Visit: Payer: Self-pay

## 2023-05-05 ENCOUNTER — Ambulatory Visit: Payer: No Typology Code available for payment source | Admitting: Physical Therapy

## 2023-05-05 DIAGNOSIS — M5459 Other low back pain: Secondary | ICD-10-CM | POA: Diagnosis not present

## 2023-05-05 DIAGNOSIS — M6281 Muscle weakness (generalized): Secondary | ICD-10-CM | POA: Diagnosis not present

## 2023-05-05 DIAGNOSIS — M25552 Pain in left hip: Secondary | ICD-10-CM | POA: Diagnosis not present

## 2023-05-05 NOTE — Therapy (Signed)
OUTPATIENT PHYSICAL THERAPY LUMBAR EVALUATION   Patient Name: Dominic Joseph MRN: [id number] DOB:May 23, 1980, 43 y.o., male Today's Date: 05/05/2023  END OF SESSION:  PT End of Session - 05/05/23 1555     Visit Number 1    Number of Visits 12    Date for PT Re-Evaluation 06/16/23    Authorization Type [employer]    PT Start Time 1515    PT Stop Time 1600    PT Time Calculation (min) 45 min    Activity Tolerance Patient tolerated treatment well    Behavior During Therapy WFL for tasks assessed/performed             Past Medical History:  Diagnosis Date   Depression    Environmental allergies    Hyperlipidemia    Hypertension    Sarcoidosis of lung (HCC)    Syncope and collapse    Past Surgical History:  Procedure Laterality Date   LUNG BIOPSY Left    SHOULDER SURGERY Right    SHOULDER SURGERY Left 11/27/2021   WISDOM TOOTH EXTRACTION     Patient Active Problem List   Diagnosis Date Noted   Neutropenia (HCC) 08/15/2022   Family history of prostate cancer 06/09/2022   Healthcare maintenance 06/09/2022   Visit for suture removal 05/20/2022   Slow transit constipation 05/20/2022   Benign prostatic hyperplasia with urinary hesitancy 05/20/2022   Syncope and collapse    Cluster headaches 07/07/2019   Essential hypertension 07/07/2019   Fatigue 07/07/2019   ADD (attention deficit disorder) 07/07/2019   Severe depression (HCC) 07/07/2019   IBS (irritable colon syndrome) 07/07/2019   Elevated serum creatinine 07/07/2019   Chronic pansinusitis 11/30/2018   Perennial allergic rhinitis 11/30/2018   Tobacco use disorder 01/29/2012   Environmental allergies 01/28/2012   Plantar fascial fibromatosis 12/23/2011   Asthma 07/25/2011   Sarcoidosis 07/25/2011   Pain in joint involving lower leg 06/10/2011   Primary localized osteoarthrosis, lower leg 06/10/2011    PCP: Mliss Sax, MD   REFERRING PROVIDER: Vickki Hearing, MD   REFERRING DIAG: M54.50  (ICD-10-CM) - Lumbar pain   Rationale for Evaluation and Treatment: Rehabilitation  THERAPY DIAG:  Other low back pain  Pain in left hip  Muscle weakness (generalized)  ONSET DATE: Intermittent pain for 2 years  SUBJECTIVE:                                                                                                                                                                                           SUBJECTIVE STATEMENT: He says his back pain presents as hip pain and if he turns a certain way  he almost falls due to painful catch in hip. The pain has become worse lately where it hurts all the time now. He has had 2 corisone injections which only helped temporarily. He denies N/T but does feel he is getting some weakness in his left leg. He relays he gets really tight from sitting in car or airplane.   PERTINENT HISTORY:    PAIN:  NPRS scale: 6/10 upon arrival Pain location:Rt lumbar and posterior/lateral hip Pain description: constant Aggravating factors: everything, no clear aggravating factors except sitting too long or standing too long Relieving factors: meds, suana   PRECAUTIONS: None  RED FLAGS: None   WEIGHT BEARING RESTRICTIONS: No  FALLS:  Has patient fallen in last 6 months? No  OCCUPATION: office work  PLOF: Independent  PATIENT GOALS: reduce the pain  NEXT MD VISIT: 06/15/23  OBJECTIVE:  Note: Objective measures were completed at Evaluation unless otherwise noted.  DIAGNOSTIC FINDINGS:  DG Lumbar Spine 2-3 Views Result Date: 04/16/2023 "Lumbar imaging Done for hip versus back pain X-ray shows flattening of the lumbar spine normal alignment otherwise good disc spaces. Impression flatback no disc or spondylolysis or listhesis"  PATIENT SURVEYS:  Eval: FOTO 57% functional, goal is 68%  COGNITION: Overall cognitive status: Within functional limits for tasks assessed     SENSATION: WFL  MUSCLE LENGTH: Hamstrings: very tight bilat around 70  deg  PALPATION: TTP L3-5, left SIJ, left glutes/piriformis/paraspinals, over greater trochanter with active trigger points noted  LUMBAR ROM:   AROM eval  Flexion   Extension   Right lateral flexion   Left lateral flexion   Right rotation   Left rotation    (Blank rows = not tested)  LOWER EXTREMITY ROM:   AROM grossly WFL   LOWER EXTREMITY MMT:    MMT Right eval Left eval  Hip flexion 5 4  Hip extension    Hip abduction 5 4  Hip adduction    Hip internal rotation    Hip external rotation    Knee flexion 5 4  Knee extension 5 4  Ankle dorsiflexion 5 5  Ankle plantarflexion    Ankle inversion    Ankle eversion     (Blank rows = not tested)  LUMBAR SPECIAL TESTS:  Straight leg raise test: Positive and Slump test: Positive Long axis distraction/traction Positive  GAIT: Comments: WFL TODAY'S TREATMENT:  Eval HEP creation and review with demonstration and education see below for details Selfcare: see education section below    PATIENT EDUCATION: Education details: HEP, PT plan of care, exam findings, home TENS unit, Dry needling explanations, indications, follow up care Person educated: Patient Education method: Explanation, Demonstration, Verbal cues, and Handouts Education comprehension: verbalized understanding and needs further education   HOME EXERCISE PROGRAM: Access Code: [email or web address] URL: [email or web address] Date: 05/05/2023 Prepared by: Ivery Quale  Exercises - Standing Lumbar Extension  - 2 x daily - 6 x weekly - 2 sets - 10 reps - 5 hold - Supine Bridge  - 2 x daily - 6 x weekly - 2 sets - 10 reps - 5 hold - supine IT band and piriformis stretch  - 2 x daily - 6 x weekly - 1 sets - 3 reps - 30 sec hold - Supine Figure 4 Piriformis Stretch  - 2 x daily - 6 x weekly - 1 sets - 3 reps - 30 sec hold - Seated Slump Nerve Glide  - 2 x daily - 6 x weekly - 1 sets - 10 reps -  3 sec hold - Modified Thomas Stretch  - 2 x daily - 6 x  weekly - 1 sets - 3 reps - 60 sec hold  ASSESSMENT:  CLINICAL IMPRESSION: Patient referred to PT for evaluate and treat lumbar spine. He has signs of radicular symptoms but also intrarticular hip pathology with special testing today. I did try DN to see if this helps with any of the tightness, trigger points and pain. Patient will benefit from skilled PT to address below impairments, limitations and improve overall function.  OBJECTIVE IMPAIRMENTS: decreased activity tolerance, difficulty prolonge walking or sitting, decreased mobility, decreased ROM, decreased strength, impaired flexibility, impaired LE use, and pain.  ACTIVITY LIMITATIONS: bending, lifting, carry, locomotion, cleaning, community activity, driving, and or occupation  PERSONAL FACTORS: see above PMH are also affecting patient's functional outcome.  REHAB POTENTIAL: Good  CLINICAL DECISION MAKING: Evolving/moderate complexity  EVALUATION COMPLEXITY: Moderate    GOALS: Short term PT Goals Target date: 06/02/2023   Pt will be I and compliant with HEP. Baseline:  Goal status: New Pt will decrease pain by 25% overall Baseline:8/10 Goal status: New  Long term PT goals Target date:06/30/2023   Pt will improve lumbar and hip ROM to WNL to improve functional mobility Baseline: Goal status: New Pt will improve  hip/knee strength to at least 5-/5 MMT to improve functional strength Baseline: Goal status: New Pt will improve FOTO to at least 68% functional to show improved function Baseline: Goal status: New Pt will reduce pain to overall less than 2-3/10 with usual activity and work activity. Baseline: Goal status: New  PLAN: PT FREQUENCY: 1-3 times per week   PT DURATION: 6  weeks  PLANNED INTERVENTIONS (unless contraindicated): aquatic PT, Canalith repositioning, cryotherapy, Electrical stimulation, Iontophoresis with 4 mg/ml dexamethasome, Moist heat, traction, Ultrasound, gait training, Therapeutic exercise,  balance training, neuromuscular re-education, patient/family education, manual techniques, passive ROM, dry needling, taping, vasopnuematic device, vestibular, spinal manipulations, joint manipulations 97110-Therapeutic exercises, 97530- Therapeutic activity, 97112- Neuromuscular re-education, 97535- Self Care, [id number]- Manual therapy, and 97012- Traction (mechanical)  PLAN FOR NEXT SESSION: check lumbar AROM, how is HEP going. How was DN? Consider traction.    April Manson, PT,DPT 05/05/2023, 4:11 PM

## 2023-05-20 DIAGNOSIS — F411 Generalized anxiety disorder: Secondary | ICD-10-CM | POA: Diagnosis not present

## 2023-05-20 DIAGNOSIS — F4312 Post-traumatic stress disorder, chronic: Secondary | ICD-10-CM | POA: Diagnosis not present

## 2023-05-20 DIAGNOSIS — F332 Major depressive disorder, recurrent severe without psychotic features: Secondary | ICD-10-CM | POA: Diagnosis not present

## 2023-05-21 ENCOUNTER — Ambulatory Visit: Payer: No Typology Code available for payment source | Admitting: Physical Therapy

## 2023-05-21 ENCOUNTER — Encounter: Payer: Self-pay | Admitting: Physical Therapy

## 2023-05-21 DIAGNOSIS — M25552 Pain in left hip: Secondary | ICD-10-CM

## 2023-05-21 DIAGNOSIS — M6281 Muscle weakness (generalized): Secondary | ICD-10-CM | POA: Diagnosis not present

## 2023-05-21 DIAGNOSIS — M5459 Other low back pain: Secondary | ICD-10-CM

## 2023-05-21 NOTE — Therapy (Signed)
 OUTPATIENT PHYSICAL THERAPY TREATMENT   Patient Name: Dominic Joseph MRN: [id number] DOB:1980-07-27, 43 y.o., male Today's Date: 05/21/2023  END OF SESSION:  PT End of Session - 05/21/23 1550     Visit Number 2    Number of Visits 12    Date for PT Re-Evaluation 06/16/23    Authorization Type [employer]    PT Start Time 1430    PT Stop Time 1520    PT Time Calculation (min) 50 min    Activity Tolerance Patient tolerated treatment well    Behavior During Therapy WFL for tasks assessed/performed              Past Medical History:  Diagnosis Date   Depression    Environmental allergies    Hyperlipidemia    Hypertension    Sarcoidosis of lung (HCC)    Syncope and collapse    Past Surgical History:  Procedure Laterality Date   LUNG BIOPSY Left    SHOULDER SURGERY Right    SHOULDER SURGERY Left 11/27/2021   WISDOM TOOTH EXTRACTION     Patient Active Problem List   Diagnosis Date Noted   Neutropenia (HCC) 08/15/2022   Family history of prostate cancer 06/09/2022   Healthcare maintenance 06/09/2022   Visit for suture removal 05/20/2022   Slow transit constipation 05/20/2022   Benign prostatic hyperplasia with urinary hesitancy 05/20/2022   Syncope and collapse    Cluster headaches 07/07/2019   Essential hypertension 07/07/2019   Fatigue 07/07/2019   ADD (attention deficit disorder) 07/07/2019   Severe depression (HCC) 07/07/2019   IBS (irritable colon syndrome) 07/07/2019   Elevated serum creatinine 07/07/2019   Chronic pansinusitis 11/30/2018   Perennial allergic rhinitis 11/30/2018   Tobacco use disorder 01/29/2012   Environmental allergies 01/28/2012   Plantar fascial fibromatosis 12/23/2011   Asthma 07/25/2011   Sarcoidosis 07/25/2011   Pain in joint involving lower leg 06/10/2011   Primary localized osteoarthrosis, lower leg 06/10/2011    PCP: Mliss Sax, MD   REFERRING PROVIDER: Vickki Hearing, MD   REFERRING DIAG: M54.50  (ICD-10-CM) - Lumbar pain   Rationale for Evaluation and Treatment: Rehabilitation  THERAPY DIAG:  Other low back pain  Pain in left hip  Muscle weakness (generalized)  ONSET DATE: Intermittent pain for 2 years  SUBJECTIVE:                                                                                                                                                                                           SUBJECTIVE STATEMENT: Relays the pain is still there, the DN helped some for about 2 days  PERTINENT  HISTORY:    PAIN:  NPRS scale: 6/10 upon arrival Pain location:Rt lumbar and posterior/lateral hip Pain description: constant Aggravating factors: everything, no clear aggravating factors except sitting too long or standing too long Relieving factors: meds, suana   PRECAUTIONS: None  RED FLAGS: None   WEIGHT BEARING RESTRICTIONS: No  FALLS:  Has patient fallen in last 6 months? No  OCCUPATION: office work  PLOF: Independent  PATIENT GOALS: reduce the pain  NEXT MD VISIT: 06/15/23  OBJECTIVE:  Note: Objective measures were completed at Evaluation unless otherwise noted.  DIAGNOSTIC FINDINGS:  DG Lumbar Spine 2-3 Views Result Date: 04/16/2023 "Lumbar imaging Done for hip versus back pain X-ray shows flattening of the lumbar spine normal alignment otherwise good disc spaces. Impression flatback no disc or spondylolysis or listhesis"  PATIENT SURVEYS:  Eval: FOTO 57% functional, goal is 68%  COGNITION: Overall cognitive status: Within functional limits for tasks assessed     SENSATION: WFL  MUSCLE LENGTH: Hamstrings: very tight bilat around 70 deg  PALPATION: TTP L3-5, left SIJ, left glutes/piriformis/paraspinals, over greater trochanter with active trigger points noted  LUMBAR ROM:   AROM eval  Flexion   Extension   Right lateral flexion   Left lateral flexion   Right rotation   Left rotation    (Blank rows = not tested)  LOWER EXTREMITY  ROM:   AROM grossly WFL   LOWER EXTREMITY MMT:    MMT Right eval Left eval  Hip flexion 5 4  Hip extension    Hip abduction 5 4  Hip adduction    Hip internal rotation    Hip external rotation    Knee flexion 5 4  Knee extension 5 4  Ankle dorsiflexion 5 5  Ankle plantarflexion    Ankle inversion    Ankle eversion     (Blank rows = not tested)  LUMBAR SPECIAL TESTS:  Straight leg raise test: Positive and Slump test: Positive Long axis distraction/traction Positive  GAIT: Comments: WFL TODAY'S TREATMENT:  Trigger Point Dry Needling (not included in billable time  Subsequent Treatment: Instructions provided previously at initial dry needling treatment.   Patient Verbal Consent Given: Yes Education Handout Provided: Yes Muscles Treated: left lumbar P.S, QL,  Electrical Stimulation Performed: No Treatment Response/Outcome: good overall tolerance with twitch response noted  Therex Supine piriformis stretch push knee down 30 sec X 3 Supine piriformis stretch knee to opposite shoulder 30 sec X 3 Supine bridges 5 sec X 10 Supine hip flexor stretch/distraction with  leg off EOB 1 min X 2 (discontinued after 2nd reps as he says leg was going numb) Seated slump stretch X 10 Supine SKTC stretch 30 sec X 2 bilat Supine LTR (discontinued due to pain)  Manual: Left leg long axis distraction 30 sec X 5 Manual hamstring stretching 30 sec X 3 ITB stretching 30 sec X 3  IFC electrical stimulation X 15 min with moist heat to lumbar and left posterior lateral hip. Intensity turned up to tolerance  Eval HEP creation and review with demonstration and education see below for details Selfcare: see education section below    PATIENT EDUCATION: Education details: HEP, PT plan of care, exam findings, home TENS unit, Dry needling explanations, indications, follow up care Person educated: Patient Education method: Explanation, Demonstration, Verbal cues, and Handouts Education  comprehension: verbalized understanding and needs further education   HOME EXERCISE PROGRAM: Access Code: [email or web address] URL: [email or web address] Date: 05/05/2023 Prepared by: Ivery Quale  Exercises - Standing Lumbar Extension  -  2 x daily - 6 x weekly - 2 sets - 10 reps - 5 hold - Supine Bridge  - 2 x daily - 6 x weekly - 2 sets - 10 reps - 5 hold - supine IT band and piriformis stretch  - 2 x daily - 6 x weekly - 1 sets - 3 reps - 30 sec hold - Supine Figure 4 Piriformis Stretch  - 2 x daily - 6 x weekly - 1 sets - 3 reps - 30 sec hold - Seated Slump Nerve Glide  - 2 x daily - 6 x weekly - 1 sets - 10 reps - 3 sec hold - Modified Thomas Stretch  - 2 x daily - 6 x weekly - 1 sets - 3 reps - 60 sec hold  ASSESSMENT:  CLINICAL IMPRESSION: He is still having pain so modified exercises accordingly as some were irritating. I did continue with DN as he felt this helped for 2 days last time. We also tried electrical stimulation today as he as a home TENS unit so if this was helpful he can use his home unit now.   OBJECTIVE IMPAIRMENTS: decreased activity tolerance, difficulty prolonge walking or sitting, decreased mobility, decreased ROM, decreased strength, impaired flexibility, impaired LE use, and pain.  ACTIVITY LIMITATIONS: bending, lifting, carry, locomotion, cleaning, community activity, driving, and or occupation  PERSONAL FACTORS: see above PMH are also affecting patient's functional outcome.  REHAB POTENTIAL: Good  CLINICAL DECISION MAKING: Evolving/moderate complexity  EVALUATION COMPLEXITY: Moderate    GOALS: Short term PT Goals Target date: 06/02/2023   Pt will be I and compliant with HEP. Baseline:  Goal status: New Pt will decrease pain by 25% overall Baseline:8/10 Goal status: New  Long term PT goals Target date:06/30/2023   Pt will improve lumbar and hip ROM to WNL to improve functional mobility Baseline: Goal status: New Pt will improve   hip/knee strength to at least 5-/5 MMT to improve functional strength Baseline: Goal status: New Pt will improve FOTO to at least 68% functional to show improved function Baseline: Goal status: New Pt will reduce pain to overall less than 2-3/10 with usual activity and work activity. Baseline: Goal status: New  PLAN: PT FREQUENCY: 1-3 times per week   PT DURATION: 6  weeks  PLANNED INTERVENTIONS (unless contraindicated): aquatic PT, Canalith repositioning, cryotherapy, Electrical stimulation, Iontophoresis with 4 mg/ml dexamethasome, Moist heat, traction, Ultrasound, gait training, Therapeutic exercise, balance training, neuromuscular re-education, patient/family education, manual techniques, passive ROM, dry needling, taping, vasopnuematic device, vestibular, spinal manipulations, joint manipulations 97110-Therapeutic exercises, 97530- Therapeutic activity, 97112- Neuromuscular re-education, 97535- Self Care, 16109- Manual therapy, and 97012- Traction (mechanical)  PLAN FOR NEXT SESSION:HEP going. How was DN? Consider traction.    April Manson, PT,DPT 05/21/2023, 4:00 PM

## 2023-05-26 ENCOUNTER — Ambulatory Visit: Payer: 59 | Admitting: Physical Therapy

## 2023-05-26 ENCOUNTER — Encounter: Payer: Self-pay | Admitting: Physical Therapy

## 2023-05-26 DIAGNOSIS — M6281 Muscle weakness (generalized): Secondary | ICD-10-CM

## 2023-05-26 DIAGNOSIS — M25552 Pain in left hip: Secondary | ICD-10-CM

## 2023-05-26 DIAGNOSIS — M5459 Other low back pain: Secondary | ICD-10-CM | POA: Diagnosis not present

## 2023-05-26 NOTE — Therapy (Signed)
 OUTPATIENT PHYSICAL THERAPY TREATMENT   Patient Name: Dominic Joseph MRN: [id number] DOB:1980/05/30, 43 y.o., male Today's Date: 05/26/2023  END OF SESSION:  PT End of Session - 05/26/23 1547     Visit Number 3    Number of Visits 12    Date for PT Re-Evaluation 06/16/23    Authorization Type [employer]    PT Start Time 1515    PT Stop Time 1600    PT Time Calculation (min) 45 min    Activity Tolerance Patient tolerated treatment well    Behavior During Therapy WFL for tasks assessed/performed              Past Medical History:  Diagnosis Date   Depression    Environmental allergies    Hyperlipidemia    Hypertension    Sarcoidosis of lung (HCC)    Syncope and collapse    Past Surgical History:  Procedure Laterality Date   LUNG BIOPSY Left    SHOULDER SURGERY Right    SHOULDER SURGERY Left 11/27/2021   WISDOM TOOTH EXTRACTION     Patient Active Problem List   Diagnosis Date Noted   Neutropenia (HCC) 08/15/2022   Family history of prostate cancer 06/09/2022   Healthcare maintenance 06/09/2022   Visit for suture removal 05/20/2022   Slow transit constipation 05/20/2022   Benign prostatic hyperplasia with urinary hesitancy 05/20/2022   Syncope and collapse    Cluster headaches 07/07/2019   Essential hypertension 07/07/2019   Fatigue 07/07/2019   ADD (attention deficit disorder) 07/07/2019   Severe depression (HCC) 07/07/2019   IBS (irritable colon syndrome) 07/07/2019   Elevated serum creatinine 07/07/2019   Chronic pansinusitis 11/30/2018   Perennial allergic rhinitis 11/30/2018   Tobacco use disorder 01/29/2012   Environmental allergies 01/28/2012   Plantar fascial fibromatosis 12/23/2011   Asthma 07/25/2011   Sarcoidosis 07/25/2011   Pain in joint involving lower leg 06/10/2011   Primary localized osteoarthrosis, lower leg 06/10/2011    PCP: Mliss Sax, MD   REFERRING PROVIDER: Vickki Hearing, MD   REFERRING DIAG: M54.50  (ICD-10-CM) - Lumbar pain   Rationale for Evaluation and Treatment: Rehabilitation  THERAPY DIAG:  Other low back pain  Pain in left hip  Muscle weakness (generalized)  ONSET DATE: Intermittent pain for 2 years  SUBJECTIVE:                                                                                                                                                                                           SUBJECTIVE STATEMENT: Relays his back is feeling better but his left hip is feeling worse pain radiates from  superior lateral hip to anterior hip around groin.  PERTINENT HISTORY:    PAIN:  NPRS scale: 8/10 in hip, back is doing good today Pain location:Rt lumbar and posterior/lateral hip Pain description: constant Aggravating factors: everything, no clear aggravating factors except sitting too long or standing too long Relieving factors: meds, suana   PRECAUTIONS: None  RED FLAGS: None   WEIGHT BEARING RESTRICTIONS: No  FALLS:  Has patient fallen in last 6 months? No  OCCUPATION: office work  PLOF: Independent  PATIENT GOALS: reduce the pain  NEXT MD VISIT: 06/15/23  OBJECTIVE:  Note: Objective measures were completed at Evaluation unless otherwise noted.  DIAGNOSTIC FINDINGS:  DG Lumbar Spine 2-3 Views Result Date: 04/16/2023 "Lumbar imaging Done for hip versus back pain X-ray shows flattening of the lumbar spine normal alignment otherwise good disc spaces. Impression flatback no disc or spondylolysis or listhesis"  PATIENT SURVEYS:  Eval: FOTO 57% functional, goal is 68%  COGNITION: Overall cognitive status: Within functional limits for tasks assessed     SENSATION: WFL  MUSCLE LENGTH: Hamstrings: very tight bilat around 70 deg  PALPATION: TTP L3-5, left SIJ, left glutes/piriformis/paraspinals, over greater trochanter with active trigger points noted  LUMBAR ROM:   AROM eval  Flexion   Extension   Right lateral flexion   Left lateral  flexion   Right rotation   Left rotation    (Blank rows = not tested)  LOWER EXTREMITY ROM:   AROM grossly WFL   LOWER EXTREMITY MMT:    MMT Right eval Left eval  Hip flexion 5 4  Hip extension    Hip abduction 5 4  Hip adduction    Hip internal rotation    Hip external rotation    Knee flexion 5 4  Knee extension 5 4  Ankle dorsiflexion 5 5  Ankle plantarflexion    Ankle inversion    Ankle eversion     (Blank rows = not tested)  LUMBAR SPECIAL TESTS:  Straight leg raise test: Positive and Slump test: Positive Long axis distraction/traction Positive  GAIT: Comments: WFL TODAY'S TREATMENT:  05/26/23 Trigger Point Dry Needling (not included in billable time  Subsequent Treatment: Instructions provided previously at initial dry needling treatment.   Patient Verbal Consent Given: Yes Education Handout Provided: Yes Muscles Treated: left TFL, hip flexors Electrical Stimulation Performed: No Treatment Response/Outcome: good overall tolerance with twitch response noted  Therex Supine hip flexor stretch with strap leg off EOB, 3 X 30 sec Cues and demo for technique and to use strap to assist lowering and raising to reduce pain and instructed to add this to HEP.  Selfcare: recommended he follow up with MD about his worsening hip pain as his back is getting better but hip is getting worse and he has signs and symptoms of intraarticular hip pathology.   Mechanical lumbar traction 115-105# X 20 min total with set up time  05/21/23 Trigger Point Dry Needling (not included in billable time  Subsequent Treatment: Instructions provided previously at initial dry needling treatment.   Patient Verbal Consent Given: Yes Education Handout Provided: Yes Muscles Treated: left lumbar P.S, QL,  Electrical Stimulation Performed: No Treatment Response/Outcome: good overall tolerance with twitch response noted  Therex Supine piriformis stretch push knee down 30 sec X 3 Supine  piriformis stretch knee to opposite shoulder 30 sec X 3 Supine bridges 5 sec X 10 Supine hip flexor stretch/distraction with  leg off EOB 1 min X 2 (discontinued after 2nd reps as he  says leg was going numb) Seated slump stretch X 10 Supine SKTC stretch 30 sec X 2 bilat Supine LTR (discontinued due to pain)  Manual: Left leg long axis distraction 30 sec X 5 Manual hamstring stretching 30 sec X 3 ITB stretching 30 sec X 3  IFC electrical stimulation X 15 min with moist heat to lumbar and left posterior lateral hip. Intensity turned up to tolerance  Eval HEP creation and review with demonstration and education see below for details Selfcare: see education section below    PATIENT EDUCATION: Education details: HEP, PT plan of care, exam findings, home TENS unit, Dry needling explanations, indications, follow up care Person educated: Patient Education method: Explanation, Demonstration, Verbal cues, and Handouts Education comprehension: verbalized understanding and needs further education   HOME EXERCISE PROGRAM: Access Code: [email or web address] URL: [email or web address] Date: 05/05/2023 Prepared by: Ivery Quale  Exercises - Standing Lumbar Extension  - 2 x daily - 6 x weekly - 2 sets - 10 reps - 5 hold - Supine Bridge  - 2 x daily - 6 x weekly - 2 sets - 10 reps - 5 hold - supine IT band and piriformis stretch  - 2 x daily - 6 x weekly - 1 sets - 3 reps - 30 sec hold - Supine Figure 4 Piriformis Stretch  - 2 x daily - 6 x weekly - 1 sets - 3 reps - 30 sec hold - Seated Slump Nerve Glide  - 2 x daily - 6 x weekly - 1 sets - 10 reps - 3 sec hold - Modified Thomas Stretch  - 2 x daily - 6 x weekly - 1 sets - 3 reps - 60 sec hold  ASSESSMENT:  CLINICAL IMPRESSION: His back has improved but hip has not. I did focus DN intervention to his lateral and anterior hip today as this is now his chief complaint spot. We also tried mechanical lumbar traction to see if this will help  with any of his symptoms.   OBJECTIVE IMPAIRMENTS: decreased activity tolerance, difficulty prolonge walking or sitting, decreased mobility, decreased ROM, decreased strength, impaired flexibility, impaired LE use, and pain.  ACTIVITY LIMITATIONS: bending, lifting, carry, locomotion, cleaning, community activity, driving, and or occupation  PERSONAL FACTORS: see above PMH are also affecting patient's functional outcome.  REHAB POTENTIAL: Good  CLINICAL DECISION MAKING: Evolving/moderate complexity  EVALUATION COMPLEXITY: Moderate    GOALS: Short term PT Goals Target date: 06/02/2023   Pt will be I and compliant with HEP. Baseline:  Goal status: ongoing, added new one 05/26/22 Pt will decrease pain by 25% overall Baseline:8/10 Goal status: met for back but not for hip, 05/26/22  Long term PT goals Target date:06/30/2023   Pt will improve lumbar and hip ROM to WNL to improve functional mobility Baseline: Goal status: ongoing 05/26/22 Pt will improve  hip/knee strength to at least 5-/5 MMT to improve functional strength Baseline: Goal status: ongoing 05/26/22 Pt will improve FOTO to at least 68% functional to show improved function Baseline: Goal status: ongoing 05/26/22 Pt will reduce pain to overall less than 2-3/10 with usual activity and work activity. Baseline: Goal status: ongoing 05/26/22  PLAN: PT FREQUENCY: 1-3 times per week   PT DURATION: 6  weeks  PLANNED INTERVENTIONS (unless contraindicated): aquatic PT, Canalith repositioning, cryotherapy, Electrical stimulation, Iontophoresis with 4 mg/ml dexamethasome, Moist heat, traction, Ultrasound, gait training, Therapeutic exercise, balance training, neuromuscular re-education, patient/family education, manual techniques, passive ROM, dry needling, taping, vasopnuematic device, vestibular, spinal manipulations, joint manipulations  97110-Therapeutic exercises, 97530- Therapeutic activity, [id number]- Neuromuscular re-education, 97535-  Self Care, 16109- Manual therapy, and 97012- Traction (mechanical)  PLAN FOR NEXT SESSION: How was traction and DN to ant-lat hip?    April Manson, PT,DPT 05/26/2023, 3:48 PM

## 2023-05-28 DIAGNOSIS — E669 Obesity, unspecified: Secondary | ICD-10-CM | POA: Diagnosis not present

## 2023-06-02 ENCOUNTER — Encounter: Payer: Self-pay | Admitting: Physical Therapy

## 2023-06-02 ENCOUNTER — Ambulatory Visit: Payer: 59 | Admitting: Physical Therapy

## 2023-06-02 DIAGNOSIS — M5459 Other low back pain: Secondary | ICD-10-CM | POA: Diagnosis not present

## 2023-06-02 DIAGNOSIS — M25552 Pain in left hip: Secondary | ICD-10-CM | POA: Diagnosis not present

## 2023-06-02 DIAGNOSIS — M6281 Muscle weakness (generalized): Secondary | ICD-10-CM | POA: Diagnosis not present

## 2023-06-02 NOTE — Therapy (Addendum)
 OUTPATIENT PHYSICAL THERAPY TREATMENT   Patient Name: Dominic Joseph MRN: [id number] DOB:September 23, 1980, 43 y.o., male Today's Date: 06/02/2023  END OF SESSION:  PT End of Session - 06/02/23 1504     Visit Number 4    Number of Visits 12    Date for PT Re-Evaluation 06/16/23    Authorization Type [employer]    PT Start Time 1459    PT Stop Time 1540    PT Time Calculation (min) 41 min    Activity Tolerance Patient tolerated treatment well    Behavior During Therapy WFL for tasks assessed/performed              Past Medical History:  Diagnosis Date   Depression    Environmental allergies    Hyperlipidemia    Hypertension    Sarcoidosis of lung (HCC)    Syncope and collapse    Past Surgical History:  Procedure Laterality Date   LUNG BIOPSY Left    SHOULDER SURGERY Right    SHOULDER SURGERY Left 11/27/2021   WISDOM TOOTH EXTRACTION     Patient Active Problem List   Diagnosis Date Noted   Neutropenia (HCC) 08/15/2022   Family history of prostate cancer 06/09/2022   Healthcare maintenance 06/09/2022   Visit for suture removal 05/20/2022   Slow transit constipation 05/20/2022   Benign prostatic hyperplasia with urinary hesitancy 05/20/2022   Syncope and collapse    Cluster headaches 07/07/2019   Essential hypertension 07/07/2019   Fatigue 07/07/2019   ADD (attention deficit disorder) 07/07/2019   Severe depression (HCC) 07/07/2019   IBS (irritable colon syndrome) 07/07/2019   Elevated serum creatinine 07/07/2019   Chronic pansinusitis 11/30/2018   Perennial allergic rhinitis 11/30/2018   Tobacco use disorder 01/29/2012   Environmental allergies 01/28/2012   Plantar fascial fibromatosis 12/23/2011   Asthma 07/25/2011   Sarcoidosis 07/25/2011   Pain in joint involving lower leg 06/10/2011   Primary localized osteoarthrosis, lower leg 06/10/2011    PCP: Mliss Sax, MD   REFERRING PROVIDER: Vickki Hearing, MD   REFERRING DIAG: M54.50  (ICD-10-CM) - Lumbar pain   Rationale for Evaluation and Treatment: Rehabilitation  THERAPY DIAG:  Other low back pain  Pain in left hip  Muscle weakness (generalized)  ONSET DATE: Intermittent pain for 2 years  SUBJECTIVE:                                                                                                                                                                                           SUBJECTIVE STATEMENT: Relays the pain has improved some. Felt like the traction machine helped, does not feel like  he needs the needling treatment today  PERTINENT HISTORY:    PAIN:  NPRS scale: 5/10 in hip, back is doing good today Pain location:Rt lumbar and posterior/lateral hip Pain description: constant Aggravating factors: everything, no clear aggravating factors except sitting too long or standing too long Relieving factors: meds, suana   PRECAUTIONS: None  RED FLAGS: None   WEIGHT BEARING RESTRICTIONS: No  FALLS:  Has patient fallen in last 6 months? No  OCCUPATION: office work  PLOF: Independent  PATIENT GOALS: reduce the pain  NEXT MD VISIT: 06/15/23  OBJECTIVE:  Note: Objective measures were completed at Evaluation unless otherwise noted.  DIAGNOSTIC FINDINGS:  DG Lumbar Spine 2-3 Views Result Date: 04/16/2023 "Lumbar imaging Done for hip versus back pain X-ray shows flattening of the lumbar spine normal alignment otherwise good disc spaces. Impression flatback no disc or spondylolysis or listhesis"  PATIENT SURVEYS:  Eval: FOTO 57% functional, goal is 68%  COGNITION: Overall cognitive status: Within functional limits for tasks assessed     SENSATION: WFL  MUSCLE LENGTH: Hamstrings: very tight bilat around 70 deg  PALPATION: TTP L3-5, left SIJ, left glutes/piriformis/paraspinals, over greater trochanter with active trigger points noted  LUMBAR ROM:   AROM eval  Flexion   Extension   Right lateral flexion   Left lateral flexion    Right rotation   Left rotation    (Blank rows = not tested)  LOWER EXTREMITY ROM:   AROM grossly WFL   LOWER EXTREMITY MMT:    MMT Right eval Left eval  Hip flexion 5 4  Hip extension    Hip abduction 5 4  Hip adduction    Hip internal rotation    Hip external rotation    Knee flexion 5 4  Knee extension 5 4  Ankle dorsiflexion 5 5  Ankle plantarflexion    Ankle inversion    Ankle eversion     (Blank rows = not tested)  LUMBAR SPECIAL TESTS:  Straight leg raise test: Positive and Slump test: Positive Long axis distraction/traction Positive  GAIT: Comments: WFL TODAY'S TREATMENT:  06/02/23 Therex Supine piriformis stretch 30 sec X 2 knee down Supine piriformis stretch 30 sec X 2 knee to opposite shoulder 30 sec X 2 Supine hip flexor stretch with strap leg off EOB, 3 X 30 sec Supine bridge 2X10, hold 5 sec Supine clams blue 2X10 Standing lumbar extensions at wall 5 sec hold X 10  Mechanical lumbar traction 125-115# X 20 min total with set up time  05/26/23 Trigger Point Dry Needling (not included in billable time  Subsequent Treatment: Instructions provided previously at initial dry needling treatment.   Patient Verbal Consent Given: Yes Education Handout Provided: Yes Muscles Treated: left TFL, hip flexors Electrical Stimulation Performed: No Treatment Response/Outcome: good overall tolerance with twitch response noted  Therex Supine hip flexor stretch with strap leg off EOB, 3 X 30 sec Cues and demo for technique and to use strap to assist lowering and raising to reduce pain and instructed to add this to HEP.  Selfcare: recommended he follow up with MD about his worsening hip pain as his back is getting better but hip is getting worse and he has signs and symptoms of intraarticular hip pathology.   Mechanical lumbar traction 115-105# X 20 min total with set up time  05/21/23 Trigger Point Dry Needling (not included in billable time  Subsequent  Treatment: Instructions provided previously at initial dry needling treatment.   Patient Verbal Consent Given: Yes Education  Handout Provided: Yes Muscles Treated: left lumbar P.S, QL,  Electrical Stimulation Performed: No Treatment Response/Outcome: good overall tolerance with twitch response noted  Therex Supine piriformis stretch push knee down 30 sec X 3 Supine piriformis stretch knee to opposite shoulder 30 sec X 3 Supine bridges 5 sec X 10 Supine hip flexor stretch/distraction with  leg off EOB 1 min X 2 (discontinued after 2nd reps as he says leg was going numb) Seated slump stretch X 10 Supine SKTC stretch 30 sec X 2 bilat Supine LTR (discontinued due to pain)  Manual: Left leg long axis distraction 30 sec X 5 Manual hamstring stretching 30 sec X 3 ITB stretching 30 sec X 3  IFC electrical stimulation X 15 min with moist heat to lumbar and left posterior lateral hip. Intensity turned up to tolerance   PATIENT EDUCATION: Education details: HEP, PT plan of care, exam findings, home TENS unit, Dry needling explanations, indications, follow up care Person educated: Patient Education method: Explanation, Demonstration, Verbal cues, and Handouts Education comprehension: verbalized understanding and needs further education   HOME EXERCISE PROGRAM: Access Code: [email or web address] URL: [email or web address] Date: 05/05/2023 Prepared by: Ivery Quale  Exercises - Standing Lumbar Extension  - 2 x daily - 6 x weekly - 2 sets - 10 reps - 5 hold - Supine Bridge  - 2 x daily - 6 x weekly - 2 sets - 10 reps - 5 hold - supine IT band and piriformis stretch  - 2 x daily - 6 x weekly - 1 sets - 3 reps - 30 sec hold - Supine Figure 4 Piriformis Stretch  - 2 x daily - 6 x weekly - 1 sets - 3 reps - 30 sec hold - Seated Slump Nerve Glide  - 2 x daily - 6 x weekly - 1 sets - 10 reps - 3 sec hold - Modified Thomas Stretch  - 2 x daily - 6 x weekly - 1 sets - 3 reps - 60 sec  hold  ASSESSMENT:  CLINICAL IMPRESSION: He appeared to have good response from traction last time so this was performed again with slight increase in pull this time followed by strength and stretching program for lumbar and hips.  OBJECTIVE IMPAIRMENTS: decreased activity tolerance, difficulty prolonge walking or sitting, decreased mobility, decreased ROM, decreased strength, impaired flexibility, impaired LE use, and pain.  ACTIVITY LIMITATIONS: bending, lifting, carry, locomotion, cleaning, community activity, driving, and or occupation  PERSONAL FACTORS: see above PMH are also affecting patient's functional outcome.  REHAB POTENTIAL: Good  CLINICAL DECISION MAKING: Evolving/moderate complexity  EVALUATION COMPLEXITY: Moderate    GOALS: Short term PT Goals Target date: 06/02/2023   Pt will be I and compliant with HEP. Baseline:  Goal status: ongoing, added new one 05/26/22 Pt will decrease pain by 25% overall Baseline:8/10 Goal status: met for back but not for hip, 05/26/22  Long term PT goals Target date:06/30/2023   Pt will improve lumbar and hip ROM to WNL to improve functional mobility Baseline: Goal status: ongoing 05/26/22 Pt will improve  hip/knee strength to at least 5-/5 MMT to improve functional strength Baseline: Goal status: ongoing 05/26/22 Pt will improve FOTO to at least 68% functional to show improved function Baseline: Goal status: ongoing 05/26/22 Pt will reduce pain to overall less than 2-3/10 with usual activity and work activity. Baseline: Goal status: ongoing 05/26/22  PLAN: PT FREQUENCY: 1-3 times per week   PT DURATION: 6  weeks  PLANNED INTERVENTIONS (unless contraindicated): aquatic PT, Canalith  repositioning, cryotherapy, Electrical stimulation, Iontophoresis with 4 mg/ml dexamethasome, Moist heat, traction, Ultrasound, gait training, Therapeutic exercise, balance training, neuromuscular re-education, patient/family education, manual techniques,  passive ROM, dry needling, taping, vasopnuematic device, vestibular, spinal manipulations, joint manipulations 97110-Therapeutic exercises, 97530- Therapeutic activity, 97112- Neuromuscular re-education, 97535- Self Care, 27253- Manual therapy, and 97012- Traction (mechanical)  PLAN FOR NEXT SESSION: How was traction and continue if desired.    April Manson, PT,DPT 06/02/2023, 3:04 PM

## 2023-06-09 ENCOUNTER — Encounter: Payer: Self-pay | Admitting: Physical Therapy

## 2023-06-09 ENCOUNTER — Ambulatory Visit: Payer: 59 | Admitting: Physical Therapy

## 2023-06-09 DIAGNOSIS — M6281 Muscle weakness (generalized): Secondary | ICD-10-CM

## 2023-06-09 DIAGNOSIS — M25552 Pain in left hip: Secondary | ICD-10-CM

## 2023-06-09 DIAGNOSIS — M5459 Other low back pain: Secondary | ICD-10-CM | POA: Diagnosis not present

## 2023-06-09 NOTE — Therapy (Signed)
 OUTPATIENT PHYSICAL THERAPY TREATMENT   Patient Name: Dominic Joseph MRN: [id number] DOB:1981-03-21, 43 y.o., male Today's Date: 06/09/2023  END OF SESSION:  PT End of Session - 06/09/23 1541     Visit Number 5    Number of Visits 12    Date for PT Re-Evaluation 06/16/23    Authorization Type [employer]    PT Start Time 1521    PT Stop Time 1600    PT Time Calculation (min) 39 min    Activity Tolerance Patient tolerated treatment well    Behavior During Therapy WFL for tasks assessed/performed              Past Medical History:  Diagnosis Date   Depression    Environmental allergies    Hyperlipidemia    Hypertension    Sarcoidosis of lung (HCC)    Syncope and collapse    Past Surgical History:  Procedure Laterality Date   LUNG BIOPSY Left    SHOULDER SURGERY Right    SHOULDER SURGERY Left 11/27/2021   WISDOM TOOTH EXTRACTION     Patient Active Problem List   Diagnosis Date Noted   Neutropenia (HCC) 08/15/2022   Family history of prostate cancer 06/09/2022   Healthcare maintenance 06/09/2022   Visit for suture removal 05/20/2022   Slow transit constipation 05/20/2022   Benign prostatic hyperplasia with urinary hesitancy 05/20/2022   Syncope and collapse    Cluster headaches 07/07/2019   Essential hypertension 07/07/2019   Fatigue 07/07/2019   ADD (attention deficit disorder) 07/07/2019   Severe depression (HCC) 07/07/2019   IBS (irritable colon syndrome) 07/07/2019   Elevated serum creatinine 07/07/2019   Chronic pansinusitis 11/30/2018   Perennial allergic rhinitis 11/30/2018   Tobacco use disorder 01/29/2012   Environmental allergies 01/28/2012   Plantar fascial fibromatosis 12/23/2011   Asthma 07/25/2011   Sarcoidosis 07/25/2011   Pain in joint involving lower leg 06/10/2011   Primary localized osteoarthrosis, lower leg 06/10/2011    PCP: Mliss Sax, MD   REFERRING PROVIDER: Vickki Hearing, MD   REFERRING DIAG: M54.50  (ICD-10-CM) - Lumbar pain   Rationale for Evaluation and Treatment: Rehabilitation  THERAPY DIAG:  Other low back pain  Pain in left hip  Muscle weakness (generalized)  ONSET DATE: Intermittent pain for 2 years  SUBJECTIVE:                                                                                                                                                                                           SUBJECTIVE STATEMENT: [facility] PT has helped his back but his hip pain has not improved. It is really  affecting his quality of life PERTINENT HISTORY:    PAIN:  NPRS scale: 5/10 in hip, back is doing good today Pain location:Rt lumbar and posterior/lateral hip Pain description: constant Aggravating factors: everything, no clear aggravating factors except sitting too long or standing too long Relieving factors: meds, suana   PRECAUTIONS: None  RED FLAGS: None   WEIGHT BEARING RESTRICTIONS: No  FALLS:  Has patient fallen in last 6 months? No  OCCUPATION: office work  PLOF: Independent  PATIENT GOALS: reduce the pain  NEXT MD VISIT: 06/15/23  OBJECTIVE:  Note: Objective measures were completed at Evaluation unless otherwise noted.  DIAGNOSTIC FINDINGS:  DG Lumbar Spine 2-3 Views Result Date: 04/16/2023 "Lumbar imaging Done for hip versus back pain X-ray shows flattening of the lumbar spine normal alignment otherwise good disc spaces. Impression flatback no disc or spondylolysis or listhesis"  PATIENT SURVEYS:  Eval: FOTO 57% functional, goal is 68%  COGNITION: Overall cognitive status: Within functional limits for tasks assessed     SENSATION: WFL  MUSCLE LENGTH: Hamstrings: very tight bilat around 70 deg  PALPATION: TTP L3-5, left SIJ, left glutes/piriformis/paraspinals, over greater trochanter with active trigger points noted  LUMBAR ROM:   AROM eval  Flexion   Extension   Right lateral flexion   Left lateral flexion   Right rotation   Left  rotation    (Blank rows = not tested)  LOWER EXTREMITY ROM:   AROM grossly WFL   LOWER EXTREMITY MMT:    MMT Right eval Left eval  Hip flexion 5 4  Hip extension    Hip abduction 5 4  Hip adduction    Hip internal rotation    Hip external rotation    Knee flexion 5 4  Knee extension 5 4  Ankle dorsiflexion 5 5  Ankle plantarflexion    Ankle inversion    Ankle eversion     (Blank rows = not tested)  LUMBAR SPECIAL TESTS:  Straight leg raise test: Positive and Slump test: Positive Long axis distraction/traction Positive  GAIT: Comments: WFL TODAY'S TREATMENT:  06/09/23 Mechanical lumbar traction 130-115# X 15 min Iontophoresis 4-6 hour wear home patch with 1.0CC dexamethasone and saline to left anterior hip. Verbal instructions and education provided.  Self care: Follow up instructions that if no improvement with ionto in regards to his hip that he will not need to come back to PT, he can cancel his last visit scheduled and he should follow back up with MD and that MRI may be appropriate due to his continued pain symptoms deep in left hip that has not improved. with PT.  06/02/23 Therex Supine piriformis stretch 30 sec X 2 knee down Supine piriformis stretch 30 sec X 2 knee to opposite shoulder 30 sec X 2 Supine hip flexor stretch with strap leg off EOB, 3 X 30 sec Supine bridge 2X10, hold 5 sec Supine clams blue 2X10 Standing lumbar extensions at wall 5 sec hold X 10   Mechanical lumbar traction 125-115# X 20 min total with set up time  05/26/23 Trigger Point Dry Needling (not included in billable time  Subsequent Treatment: Instructions provided previously at initial dry needling treatment.   Patient Verbal Consent Given: Yes Education Handout Provided: Yes Muscles Treated: left TFL, hip flexors Electrical Stimulation Performed: No Treatment Response/Outcome: good overall tolerance with twitch response noted  Therex Supine hip flexor stretch with strap leg off  EOB, 3 X 30 sec Cues and demo for technique and to use strap to  assist lowering and raising to reduce pain and instructed to add this to HEP.  Selfcare: recommended he follow up with MD about his worsening hip pain as his back is getting better but hip is getting worse and he has signs and symptoms of intraarticular hip pathology.   Mechanical lumbar traction 115-105# X 20 min total with set up time  05/21/23 Trigger Point Dry Needling (not included in billable time  Subsequent Treatment: Instructions provided previously at initial dry needling treatment.   Patient Verbal Consent Given: Yes Education Handout Provided: Yes Muscles Treated: left lumbar P.S, QL,  Electrical Stimulation Performed: No Treatment Response/Outcome: good overall tolerance with twitch response noted  Therex Supine piriformis stretch push knee down 30 sec X 3 Supine piriformis stretch knee to opposite shoulder 30 sec X 3 Supine bridges 5 sec X 10 Supine hip flexor stretch/distraction with  leg off EOB 1 min X 2 (discontinued after 2nd reps as he says leg was going numb) Seated slump stretch X 10 Supine SKTC stretch 30 sec X 2 bilat Supine LTR (discontinued due to pain)  Manual: Left leg long axis distraction 30 sec X 5 Manual hamstring stretching 30 sec X 3 ITB stretching 30 sec X 3  IFC electrical stimulation X 15 min with moist heat to lumbar and left posterior lateral hip. Intensity turned up to tolerance   PATIENT EDUCATION: Education details: HEP, PT plan of care, exam findings, home TENS unit, Dry needling explanations, indications, follow up care Person educated: Patient Education method: Explanation, Demonstration, Verbal cues, and Handouts Education comprehension: verbalized understanding and needs further education   HOME EXERCISE PROGRAM: Access Code: [email or web address] URL: [email or web address] Date: 05/05/2023 Prepared by: Ivery Quale  Exercises - Standing Lumbar Extension  - 2  x daily - 6 x weekly - 2 sets - 10 reps - 5 hold - Supine Bridge  - 2 x daily - 6 x weekly - 2 sets - 10 reps - 5 hold - supine IT band and piriformis stretch  - 2 x daily - 6 x weekly - 1 sets - 3 reps - 30 sec hold - Supine Figure 4 Piriformis Stretch  - 2 x daily - 6 x weekly - 1 sets - 3 reps - 30 sec hold - Seated Slump Nerve Glide  - 2 x daily - 6 x weekly - 1 sets - 10 reps - 3 sec hold - Modified Thomas Stretch  - 2 x daily - 6 x weekly - 1 sets - 3 reps - 60 sec hold  ASSESSMENT:  CLINICAL IMPRESSION: Traction has helped with back pain however nothing has helped with hip pain, I have tried DN, exercises, TENS therapy, traction and ionto (tried today for first time). If no improvement with ionto in regards to his hip that he will not need to come back to PT, he can cancel his last visit scheduled and he should follow back up with MD and that MRI may be appropriate due to his continued pain symptoms deep in left hip that has not improved. with PT.  OBJECTIVE IMPAIRMENTS: decreased activity tolerance, difficulty prolonge walking or sitting, decreased mobility, decreased ROM, decreased strength, impaired flexibility, impaired LE use, and pain.  ACTIVITY LIMITATIONS: bending, lifting, carry, locomotion, cleaning, community activity, driving, and or occupation  PERSONAL FACTORS: see above PMH are also affecting patient's functional outcome.  REHAB POTENTIAL: Good  CLINICAL DECISION MAKING: Evolving/moderate complexity  EVALUATION COMPLEXITY: Moderate    GOALS: Short term PT Goals Target date: 06/02/2023  Pt will be I and compliant with HEP. Baseline:  Goal status: ongoing, added new one 05/26/22 Pt will decrease pain by 25% overall Baseline:8/10 Goal status: met for back but not for hip, 05/26/22  Long term PT goals Target date:06/30/2023   Pt will improve lumbar and hip ROM to WNL to improve functional mobility Baseline: Goal status: ongoing 05/26/22 Pt will improve  hip/knee  strength to at least 5-/5 MMT to improve functional strength Baseline: Goal status: ongoing 05/26/22 Pt will improve FOTO to at least 68% functional to show improved function Baseline: Goal status: ongoing 05/26/22 Pt will reduce pain to overall less than 2-3/10 with usual activity and work activity. Baseline: Goal status: ongoing 05/26/22  PLAN: PT FREQUENCY: 1-3 times per week   PT DURATION: 6  weeks  PLANNED INTERVENTIONS (unless contraindicated): aquatic PT, Canalith repositioning, cryotherapy, Electrical stimulation, Iontophoresis with 4 mg/ml dexamethasome, Moist heat, traction, Ultrasound, gait training, Therapeutic exercise, balance training, neuromuscular re-education, patient/family education, manual techniques, passive ROM, dry needling, taping, vasopnuematic device, vestibular, spinal manipulations, joint manipulations 97110-Therapeutic exercises, 97530- Therapeutic activity, 97112- Neuromuscular re-education, 97535- Self Care, 16109- Manual therapy, and 97012- Traction (mechanical)  PLAN FOR NEXT SESSION: How was inoto and if no improvement we will discharge and refer back to MD.   April Manson, PT,DPT 06/09/2023, 4:08 PM

## 2023-06-11 ENCOUNTER — Encounter: Payer: 59 | Admitting: Family Medicine

## 2023-06-11 ENCOUNTER — Ambulatory Visit (INDEPENDENT_AMBULATORY_CARE_PROVIDER_SITE_OTHER): Payer: 59 | Admitting: Family Medicine

## 2023-06-11 ENCOUNTER — Ambulatory Visit: Payer: No Typology Code available for payment source | Admitting: Orthopedic Surgery

## 2023-06-11 ENCOUNTER — Encounter: Payer: Self-pay | Admitting: Family Medicine

## 2023-06-11 VITALS — BP 114/82 | HR 72 | Temp 97.1°F | Ht 75.0 in | Wt 263.2 lb

## 2023-06-11 DIAGNOSIS — I10 Essential (primary) hypertension: Secondary | ICD-10-CM | POA: Diagnosis not present

## 2023-06-11 DIAGNOSIS — Z13220 Encounter for screening for lipoid disorders: Secondary | ICD-10-CM

## 2023-06-11 DIAGNOSIS — Z131 Encounter for screening for diabetes mellitus: Secondary | ICD-10-CM | POA: Diagnosis not present

## 2023-06-11 DIAGNOSIS — Z0000 Encounter for general adult medical examination without abnormal findings: Secondary | ICD-10-CM | POA: Diagnosis not present

## 2023-06-11 NOTE — Progress Notes (Unsigned)
 Established Patient Office Visit   Subjective:  Patient ID: Dominic Joseph, male    DOB: 08-14-1980  Age: 43 y.o. MRN: [id number]  Chief Complaint  Patient presents with   Annual Exam    CPE. Pt is not fasting.     HPI Encounter Diagnoses  Name Primary?   Healthcare maintenance Yes   Screening for cholesterol level    Screening for diabetes mellitus    Essential hypertension    Doing well.  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  There is less stress in his life.  Exercise is difficult for him secondary to the ongoing hip pain.  He is followed by orthopedics for this issue.  He does have regular dental care.  Continues follow-up closely with psychiatry for severe depression.  Believes that the fluoxetine is helping.   Review of Systems  Constitutional: Negative.   HENT: Negative.    Eyes:  Negative for blurred vision, discharge and redness.  Respiratory: Negative.    Cardiovascular: Negative.   Gastrointestinal:  Negative for abdominal pain.  Genitourinary: Negative.   Musculoskeletal:  Positive for joint pain. Negative for myalgias.  Skin:  Negative for rash.  Neurological:  Negative for tingling, loss of consciousness and weakness.  Endo/Heme/Allergies:  Negative for polydipsia.  Psychiatric/Behavioral:  Positive for depression. The patient is nervous/anxious.       06/11/2023    4:01 PM 04/14/2023    3:27 PM 08/15/2022    3:03 PM  Depression screen PHQ 2/9  Decreased Interest 2 0 0  Down, Depressed, Hopeless 1 0 0  PHQ - 2 Score 3 0 0  Altered sleeping 2    Tired, decreased energy 3    Change in appetite 0    Feeling bad or failure about yourself  2    Trouble concentrating 2    Moving slowly or fidgety/restless 0    Suicidal thoughts 1    PHQ-9 Score 13    Difficult doing work/chores Not difficult at all        Current Outpatient Medications:    albuterol (VENTOLIN HFA) 108 (90 Base) MCG/ACT inhaler, Inhale 1-2 puffs into the lungs  every 6 (six) hours as needed for wheezing or shortness of breath., Disp: 8 g, Rfl: 1   amLODipine (NORVASC) 10 MG tablet, Take 1 tablet (10 mg total) by mouth daily., Disp: 90 tablet, Rfl: 2   docusate sodium (COLACE) 100 MG capsule, Take 1 capsule (100 mg total) by mouth 2 (two) times daily as needed for mild constipation., Disp: 100 capsule, Rfl: 0   EPINEPHrine 0.3 mg/0.3 mL IJ SOAJ injection, Inject into the muscle., Disp: , Rfl:    FLUoxetine (PROZAC) 20 MG capsule, Take 20 mg by mouth every morning., Disp: , Rfl:    Multiple Vitamin (MULTI-VITAMIN) tablet, Take by mouth., Disp: , Rfl:    Omega-3 Fatty Acids (FISH OIL) 300 MG CAPS, Take by mouth., Disp: , Rfl:    polyethylene glycol powder (GLYCOLAX/MIRALAX) 17 GM/SCOOP powder, Take 17 g by mouth 2 (two) times daily as needed., Disp: 3350 g, Rfl: 1   triamterene-hydrochlorothiazide (DYAZIDE) 37.5-25 MG capsule, Take 1 each (1 capsule total) by mouth daily., Disp: 90 capsule, Rfl: 1   ZEPBOUND 2.5 MG/0.5ML Pen, Inject 2.5 mg into the skin once a week., Disp: , Rfl:    Objective:     BP 114/82   Pulse 72   Temp (!) 97.1 F (36.2 C)   Ht 6'  3" (1.905 m)   Wt 263 lb 3.2 oz (119.4 kg)   SpO2 96%   BMI 32.90 kg/m  BP Readings from Last 3 Encounters:  06/11/23 114/82  04/16/23 (!) 143/92  04/14/23 (!) 147/91   Wt Readings from Last 3 Encounters:  06/11/23 263 lb 3.2 oz (119.4 kg)  04/16/23 280 lb (127 kg)  04/14/23 280 lb 9.6 oz (127.3 kg)      Physical Exam Constitutional:      General: He is not in acute distress.    Appearance: Normal appearance. He is not ill-appearing, toxic-appearing or diaphoretic.  HENT:     Head: Normocephalic and atraumatic.     Right Ear: Tympanic membrane, ear canal and external ear normal.     Left Ear: Tympanic membrane, ear canal and external ear normal.     Mouth/Throat:     Mouth: Mucous membranes are moist.     Pharynx: Oropharynx is clear. No oropharyngeal exudate or posterior  oropharyngeal erythema.  Eyes:     General: No scleral icterus.       Right eye: No discharge.        Left eye: No discharge.     Extraocular Movements: Extraocular movements intact.     Conjunctiva/sclera: Conjunctivae normal.     Pupils: Pupils are equal, round, and reactive to light.  Cardiovascular:     Rate and Rhythm: Normal rate and regular rhythm.  Pulmonary:     Effort: Pulmonary effort is normal. No respiratory distress.     Breath sounds: Normal breath sounds.  Abdominal:     General: Bowel sounds are normal.     Tenderness: There is no abdominal tenderness. There is no guarding or rebound.     Hernia: There is no hernia in the left inguinal area or right inguinal area.  Genitourinary:    Penis: Circumcised. No phimosis, paraphimosis, hypospadias, erythema, tenderness, discharge, swelling or lesions.      Testes:        Right: Mass, tenderness or swelling not present. Right testis is descended.        Left: Mass, tenderness or swelling not present. Left testis is descended.     Epididymis:     Right: Not inflamed or enlarged. No mass.     Left: Not inflamed or enlarged. No mass.  Musculoskeletal:     Cervical back: No rigidity or tenderness.  Lymphadenopathy:     Lower Body: No right inguinal adenopathy. No left inguinal adenopathy.  Skin:    General: Skin is warm and dry.  Neurological:     Mental Status: He is alert and oriented to person, place, and time.  Psychiatric:        Mood and Affect: Mood normal.        Behavior: Behavior normal.      No results found for any visits on 06/11/23.    The 10-year ASCVD risk score (Arnett DK, et al., 2019) is: 7.7%    Assessment & Plan:   Healthcare maintenance  Screening for cholesterol level -     Comprehensive metabolic panel; Future -     Lipid panel; Future  Screening for diabetes mellitus -     Comprehensive metabolic panel; Future -     Hemoglobin A1c; Future  Essential hypertension -     CBC with  Differential/Platelet; Future -     Comprehensive metabolic panel; Future -     Urinalysis, Routine w reflex microscopic; Future    Return in about  1 year (around 06/10/2024), or if symptoms worsen or fail to improve.  Encouraged regular exercise 150 minutes weekly.  Advised to stop smoking cigars.  Information given on health maintenance and disease prevention.  Information given on managing hypertension.  Continue current medications.  Continue follow-up with psychiatry for depression.  Will return fasting for above ordered blood work.  Mliss Sax, MD

## 2023-06-12 DIAGNOSIS — F411 Generalized anxiety disorder: Secondary | ICD-10-CM | POA: Diagnosis not present

## 2023-06-12 DIAGNOSIS — F332 Major depressive disorder, recurrent severe without psychotic features: Secondary | ICD-10-CM | POA: Diagnosis not present

## 2023-06-12 DIAGNOSIS — F4312 Post-traumatic stress disorder, chronic: Secondary | ICD-10-CM | POA: Diagnosis not present

## 2023-06-15 ENCOUNTER — Ambulatory Visit: Payer: No Typology Code available for payment source | Admitting: Orthopedic Surgery

## 2023-06-16 ENCOUNTER — Encounter: Payer: 59 | Admitting: Physical Therapy

## 2023-06-18 ENCOUNTER — Encounter: Payer: Self-pay | Admitting: Orthopedic Surgery

## 2023-06-18 ENCOUNTER — Other Ambulatory Visit (INDEPENDENT_AMBULATORY_CARE_PROVIDER_SITE_OTHER): Payer: Self-pay

## 2023-06-18 ENCOUNTER — Ambulatory Visit: Admitting: Orthopedic Surgery

## 2023-06-18 VITALS — BP 139/77 | HR 65 | Ht 75.0 in | Wt 263.0 lb

## 2023-06-18 DIAGNOSIS — S73192A Other sprain of left hip, initial encounter: Secondary | ICD-10-CM

## 2023-06-18 DIAGNOSIS — M545 Low back pain, unspecified: Secondary | ICD-10-CM | POA: Diagnosis not present

## 2023-06-18 DIAGNOSIS — G5601 Carpal tunnel syndrome, right upper limb: Secondary | ICD-10-CM | POA: Diagnosis not present

## 2023-06-18 DIAGNOSIS — M7062 Trochanteric bursitis, left hip: Secondary | ICD-10-CM | POA: Diagnosis not present

## 2023-06-18 DIAGNOSIS — M25552 Pain in left hip: Secondary | ICD-10-CM

## 2023-06-18 MED ORDER — CELECOXIB 200 MG PO CAPS: 200.0000 mg | ORAL_CAPSULE | Freq: Two times a day (BID) | ORAL | 0 refills | Status: DC

## 2023-06-18 NOTE — Addendum Note (Signed)
 Addended by: Vickki Hearing on: 06/18/2023 04:46 PM   Modules accepted: Orders

## 2023-06-18 NOTE — Patient Instructions (Signed)
While we are working on your approval for MRI please go ahead and call to schedule your appointment with [facility] within at least one (1) week.  ° [phone]  ° °

## 2023-06-18 NOTE — Progress Notes (Addendum)
 Chief Complaint  Patient presents with   Back Pain    Left buttocks   Hip Pain    Left     43 year old male with diagnosis of Trochanteric situs Lumbar pain Left hip pain  Dispose to over 3 injections in the bursa with no relief Physical therapy did help the back pain but not the hip pain  He is having difficulty standing walking or sitting with pain in the anterior part of the hip  EXAM  Hip flexion is painful Internal rotation is painful Leg lengths are equal Antalgic gait favoring the left lower extremity   INTERPRETATION OF OUTSIDE IMAGES:  Imaging from 2022 shows some pitting in the superior aspect of the acetabulum  Images will be repeated DG HIP UNILAT WITH PELVIS 2-3 VIEWS LEFT Result Date: 06/18/2023 AP pelvis compared to x-rays taken 2022 Similar findings are noted with some increased sclerosis around the superior portion of the acetabular dome with decreased head neck offset suggestive of FAI No real osteophytes are seen. Impression x-ray findings suggestive of femoral acetabular impingement syndrome and labral tear and degeneration of the joint      Nerve study result The above electrodiagnostic study is ABNORMAL and reveals evidence of a moderate right median nerve entrapment at the wrist (carpal tunnel syndrome) affecting sensory and motor components.    There is no significant electrodiagnostic evidence of any other focal nerve entrapment, brachial plexopathy or cervical radiculopathy.  As you know, this particular electrodiagnostic study cannot rule out chemical radiculitis or sensory only radiculopathy.    Encounter Diagnoses  Name Primary?   Pain in left hip    Trochanteric bursitis, left hip    Lumbar pain    Carpal tunnel syndrome of right wrist    Tear of left acetabular labrum, initial encounter Yes    Recommendations: 1.  Follow-up with referring physician. 2.  Continue current management of symptoms. 3.  Continue use of resting splint at  night-time and as needed during the day.    Assessment and plan  1.  Left hip pain rule out labral tear MRI with intra-articular injection of Depo-Medrol 40 mg while the dye is being placed  #2 carpal tunnel syndrome right wrist splint x 4 weeks   #3 return for imaging studies and treatment plan  Meds ordered this encounter  Medications   celecoxib (CELEBREX) 200 MG capsule    Sig: Take 1 capsule (200 mg total) by mouth 2 (two) times daily.    Dispense:  60 capsule    Refill:  0

## 2023-06-19 ENCOUNTER — Ambulatory Visit: Admitting: Orthopedic Surgery

## 2023-06-19 ENCOUNTER — Other Ambulatory Visit (INDEPENDENT_AMBULATORY_CARE_PROVIDER_SITE_OTHER)

## 2023-06-19 DIAGNOSIS — Z13220 Encounter for screening for lipoid disorders: Secondary | ICD-10-CM | POA: Diagnosis not present

## 2023-06-19 DIAGNOSIS — Z131 Encounter for screening for diabetes mellitus: Secondary | ICD-10-CM

## 2023-06-19 DIAGNOSIS — I10 Essential (primary) hypertension: Secondary | ICD-10-CM | POA: Diagnosis not present

## 2023-06-19 LAB — COMPREHENSIVE METABOLIC PANEL WITH GFR
ALT: 22 U/L (ref 0–53)
AST: 21 U/L (ref 0–37)
Albumin: 5.1 g/dL (ref 3.5–5.2)
Alkaline Phosphatase: 40 U/L (ref 39–117)
BUN: 20 mg/dL (ref 6–23)
CO2: 31 meq/L (ref 19–32)
Calcium: 10 mg/dL (ref 8.4–10.5)
Chloride: 103 meq/L (ref 96–112)
Creatinine, Ser: 1.41 mg/dL (ref 0.40–1.50)
GFR: 61.26 mL/min (ref >60.00)
Glucose, Bld: 79 mg/dL (ref 70–99)
Potassium: 3.8 meq/L (ref 3.5–5.1)
Sodium: 143 meq/L (ref 135–145)
Total Bilirubin: 0.7 mg/dL (ref 0.2–1.2)
Total Protein: 7.7 g/dL (ref 6.0–8.3)

## 2023-06-19 LAB — LIPID PANEL
Cholesterol: 188 mg/dL (ref 0–200)
HDL: 48.3 mg/dL (ref >39.00)
LDL Cholesterol: 124 mg/dL — ABNORMAL HIGH (ref 0–99)
NonHDL: 140.04
Total CHOL/HDL Ratio: 4
Triglycerides: 79 mg/dL (ref 0.0–149.0)
VLDL: 15.8 mg/dL (ref 0.0–40.0)

## 2023-06-19 LAB — URINALYSIS, ROUTINE W REFLEX MICROSCOPIC
Bilirubin Urine: NEGATIVE
Hgb urine dipstick: NEGATIVE
Leukocytes,Ua: NEGATIVE
Nitrite: NEGATIVE
RBC / HPF: NONE SEEN (ref 0–?)
Specific Gravity, Urine: 1.02 (ref 1.000–1.030)
Total Protein, Urine: NEGATIVE
Urine Glucose: NEGATIVE
Urobilinogen, UA: 0.2 (ref 0.0–1.0)
pH: 6 (ref 5.0–8.0)

## 2023-06-19 LAB — CBC WITH DIFFERENTIAL/PLATELET
Basophils Absolute: 0 10*3/uL (ref 0.0–0.1)
Basophils Relative: 0.7 % (ref 0.0–3.0)
Eosinophils Absolute: 0.2 10*3/uL (ref 0.0–0.7)
Eosinophils Relative: 3 % (ref 0.0–5.0)
HCT: 47.1 % (ref 39.0–52.0)
Hemoglobin: 15.3 g/dL (ref 13.0–17.0)
Lymphocytes Relative: 26.4 % (ref 12.0–46.0)
Lymphs Abs: 1.3 10*3/uL (ref 0.7–4.0)
MCHC: 32.6 g/dL (ref 30.0–36.0)
MCV: 83.2 fl (ref 78.0–100.0)
Monocytes Absolute: 0.4 10*3/uL (ref 0.1–1.0)
Monocytes Relative: 8.1 % (ref 3.0–12.0)
Neutro Abs: 3.1 10*3/uL (ref 1.4–7.7)
Neutrophils Relative %: 61.8 % (ref 43.0–77.0)
Platelets: 259 10*3/uL (ref 150.0–400.0)
RBC: 5.65 Mil/uL (ref 4.22–5.81)
RDW: 13.6 % (ref 11.5–15.5)
WBC: 5 10*3/uL (ref 4.0–10.5)

## 2023-06-20 LAB — HEMOGLOBIN A1C: Hgb A1c MFr Bld: 5.5 % (ref 4.6–6.5)

## 2023-06-28 ENCOUNTER — Encounter: Payer: Self-pay | Admitting: Family Medicine

## 2023-06-28 DIAGNOSIS — E669 Obesity, unspecified: Secondary | ICD-10-CM | POA: Diagnosis not present

## 2023-07-06 ENCOUNTER — Encounter: Payer: Self-pay | Admitting: Orthopedic Surgery

## 2023-07-07 ENCOUNTER — Ambulatory Visit
Admission: RE | Admit: 2023-07-07 | Discharge: 2023-07-07 | Disposition: A | Discharge status: Home / Self Care | Source: Ambulatory Visit | Attending: Orthopedic Surgery | Admitting: Orthopedic Surgery

## 2023-07-07 DIAGNOSIS — M25552 Pain in left hip: Secondary | ICD-10-CM

## 2023-07-07 MED ORDER — METHYLPREDNISOLONE ACETATE 40 MG/ML INJ SUSP (RADIOLOG
40.0000 mg | Freq: Once | INTRAMUSCULAR | Status: AC
  Administered 2023-07-07: 40 mg via INTRA_ARTICULAR

## 2023-07-07 MED ORDER — IOPAMIDOL (ISOVUE-M 200) INJECTION 41%
10.0000 mL | Freq: Once | INTRAMUSCULAR | Status: AC
  Administered 2023-07-07: 10 mL via INTRA_ARTICULAR

## 2023-07-07 MED ORDER — GADOBENATE DIMEGLUMINE 529 MG/ML IV SOLN
5.0000 mL | Freq: Once | INTRAVENOUS | Status: AC | PRN
  Administered 2023-07-07: 5 mL via INTRAVENOUS

## 2023-07-14 DIAGNOSIS — F411 Generalized anxiety disorder: Secondary | ICD-10-CM | POA: Diagnosis not present

## 2023-07-14 DIAGNOSIS — F332 Major depressive disorder, recurrent severe without psychotic features: Secondary | ICD-10-CM | POA: Diagnosis not present

## 2023-07-14 DIAGNOSIS — F4312 Post-traumatic stress disorder, chronic: Secondary | ICD-10-CM | POA: Diagnosis not present

## 2023-07-17 ENCOUNTER — Ambulatory Visit: Admitting: Orthopedic Surgery

## 2023-07-17 DIAGNOSIS — M25851 Other specified joint disorders, right hip: Secondary | ICD-10-CM | POA: Diagnosis not present

## 2023-07-17 DIAGNOSIS — M25552 Pain in left hip: Secondary | ICD-10-CM

## 2023-07-17 DIAGNOSIS — M25852 Other specified joint disorders, left hip: Secondary | ICD-10-CM | POA: Diagnosis not present

## 2023-07-17 NOTE — Progress Notes (Signed)
   There were no vitals taken for this visit.  There is no height or weight on file to calculate BMI.  No chief complaint on file.   No diagnosis found.  DOI/DOS/ Date: 07/17/23  Worse

## 2023-07-17 NOTE — Progress Notes (Signed)
  Subjective:     Patient ID: Dominic Joseph, male   DOB: [birth year], 43 y.o.   MRN: [id number]  Hip Pain    Dominic Joseph is 43 years old he was initially thought to have bursitis of the hip he had a couple of injections which did not improve he eventually was worked up and found to have hip pain and groin pain which led to cortisone injection without relief he tried anti-inflammatories as well did not improve  He eventually had an MRI of his hip and his back for those results today they show a possible labral tear and hip arthritis  Review of Systems     Objective:   Physical Exam     Assessment:     MRI shows a labral tear may be degenerative in nature  With fairly extensive hip arthritis for age  This is not appreciated to the same degree on plain film    Plan:     Recommend referral for possible hip arthroscopy depending on the doctor's opinion  Results explained to the patient including review of and AI generated list of questions.   IMPRESSION: 1. Moderate bilateral femoroacetabular cartilage degenerative change. 2. Decreased offset of the anterior superior left femoral head-neck junction that increases propensity for CAM-type femoroacetabular impingement. This actually appears to be symmetric bilaterally on prior 04/09/2021 CT abdomen and pelvis. 3. Mild degenerate irregularity of the articular surface of the anterior superior left acetabular labrum. Within the limitations of motion artifact, there may be thin linear extension of gadolinium fluid into the superior left acetabular labrum at the 12 o'clock location suspicious for a tear. This is not unexpected given the amount of left femoroacetabular cartilage degenerative change/osteoarthritis. 4. Small cyst measuring up to approximately 10 mm bordering the lateral aspect of the right iliopsoas tendon. This may represent a small/incidental amount of fluid within the right iliopsoas bursa.  Encounter Diagnoses   Name Primary?   Pain in left hip Yes   Femoroacetabular impingement of both hips

## 2023-07-21 ENCOUNTER — Other Ambulatory Visit: Payer: Self-pay | Admitting: Orthopedic Surgery

## 2023-07-21 DIAGNOSIS — S73192A Other sprain of left hip, initial encounter: Secondary | ICD-10-CM

## 2023-07-28 DIAGNOSIS — E669 Obesity, unspecified: Secondary | ICD-10-CM | POA: Diagnosis not present

## 2023-07-29 ENCOUNTER — Other Ambulatory Visit (HOSPITAL_BASED_OUTPATIENT_CLINIC_OR_DEPARTMENT_OTHER): Payer: Self-pay

## 2023-07-29 ENCOUNTER — Ambulatory Visit (HOSPITAL_BASED_OUTPATIENT_CLINIC_OR_DEPARTMENT_OTHER): Payer: Self-pay | Admitting: Orthopaedic Surgery

## 2023-07-29 ENCOUNTER — Ambulatory Visit (HOSPITAL_BASED_OUTPATIENT_CLINIC_OR_DEPARTMENT_OTHER): Admitting: Orthopaedic Surgery

## 2023-07-29 DIAGNOSIS — M25852 Other specified joint disorders, left hip: Secondary | ICD-10-CM

## 2023-07-29 DIAGNOSIS — M25851 Other specified joint disorders, right hip: Secondary | ICD-10-CM

## 2023-07-29 MED ORDER — IBUPROFEN 800 MG PO TABS
800.0000 mg | ORAL_TABLET | Freq: Three times a day (TID) | ORAL | 0 refills | Status: AC
  Filled 2023-07-29: qty 30, 10d supply, fill #0

## 2023-07-29 MED ORDER — OXYCODONE HCL 5 MG PO TABS
5.0000 mg | ORAL_TABLET | ORAL | 0 refills | Status: AC | PRN
  Filled 2023-07-29: qty 10, 2d supply, fill #0

## 2023-07-29 MED ORDER — ASPIRIN 325 MG PO TBEC
325.0000 mg | DELAYED_RELEASE_TABLET | Freq: Every day | ORAL | 0 refills | Status: AC
  Filled 2023-07-29: qty 14, 14d supply, fill #0

## 2023-07-29 MED ORDER — ACETAMINOPHEN 500 MG PO TABS
500.0000 mg | ORAL_TABLET | Freq: Three times a day (TID) | ORAL | 0 refills | Status: AC
  Filled 2023-07-29: qty 30, 10d supply, fill #0

## 2023-07-29 NOTE — Progress Notes (Signed)
 Chief Complaint: Left hip pain     History of Present Illness:    Dominic Joseph is a 43 y.o. male presents with ongoing left hip pain.  He has been seeing Dr. Phyllis Breeze for his left hip.  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  He has had an injection in the left hip which gave him several months of relief.  He has been undergoing physical therapy which have given him some relief although he persistently has pain in in the groin as he goes to the gym and has been more active.  He is here today for persistent pain despite conservative management    PMH/PSH/Family History/Social History/Meds/Allergies:    Past Medical History:  Diagnosis Date   Depression    Environmental allergies    Hyperlipidemia    Hypertension    Sarcoidosis of lung (HCC)    Syncope and collapse    Past Surgical History:  Procedure Laterality Date   LUNG BIOPSY Left    SHOULDER SURGERY Right    SHOULDER SURGERY Left 11/27/2021   WISDOM TOOTH EXTRACTION     Social History   Socioeconomic History   Marital status: Married    Spouse name: Not on file   Number of children: 4   Years of education: Not on file   Highest education level: Master's degree (e.g., MA, MS, MEng, MEd, MSW, MBA)  Occupational History   Occupation: Emergency planning/management officer  Tobacco Use   Smoking status: Some Days    Types: Cigars   Smokeless tobacco: Never  Vaping Use   Vaping status: Never Used  Substance and Sexual Activity   Alcohol use: Yes    Comment: occ   Drug use: Not Currently    Types: Marijuana    Comment: in high school   Sexual activity: Yes  Other Topics Concern   Not on file  Social History Narrative   Not on file   Social Drivers of Health   Financial Resource Strain: Low Risk  (04/12/2023)   Overall Financial Resource Strain (CARDIA)    Difficulty of Paying Living Expenses: Not hard at all  Food Insecurity: No Food Insecurity (04/12/2023)   Hunger Vital Sign    Worried  About Running Out of Food in the Last Year: Never true    Ran Out of Food in the Last Year: Never true  Transportation Needs: No Transportation Needs (04/12/2023)   PRAPARE - Administrator, Civil Service (Medical): No    Lack of Transportation (Non-Medical): No  Physical Activity: Insufficiently Active (04/12/2023)   Exercise Vital Sign    Days of Exercise per Week: 3 days    Minutes of Exercise per Session: 40 min  Stress: Stress Concern Present (04/12/2023)   [employer] of Occupational Health - Occupational Stress Questionnaire    Feeling of Stress : To some extent  Social Connections: Socially Integrated (04/12/2023)   Social Connection and Isolation Panel [NHANES]    Frequency of Communication with Friends and Family: More than three times a week    Frequency of Social Gatherings with Friends and Family: Never    Attends Religious Services: More than 4 times per year    Active Member of [employer] or Organizations: Yes    Attends Engineer, structural: More than 4 times per year    Marital Status: Married   Family History  Problem Relation Age of Onset   Hypertension Mother  Cardiomyopathy Father    Hypertension Sister    Hypertension Sister    Hypertension Brother    Cardiomyopathy Brother    Hyperlipidemia Maternal Grandmother    Hypertension Maternal Grandmother    Stroke Maternal Grandmother    Stroke Maternal Grandfather    No Known Allergies Current Outpatient Medications  Medication Sig Dispense Refill   acetaminophen  (TYLENOL ) 500 MG tablet Take 1 tablet (500 mg total) by mouth every 8 (eight) hours for 10 days. 30 tablet 0   aspirin EC 325 MG tablet Take 1 tablet (325 mg total) by mouth daily. 14 tablet 0   ibuprofen  (ADVIL ) 800 MG tablet Take 1 tablet (800 mg total) by mouth every 8 (eight) hours for 10 days. Please take with food, please alternate with acetaminophen  30 tablet 0   oxyCODONE  (ROXICODONE ) 5 MG immediate release tablet Take 1  tablet (5 mg total) by mouth every 4 (four) hours as needed for severe pain (pain score 7-10) or breakthrough pain. 10 tablet 0   albuterol  (VENTOLIN  HFA) 108 (90 Base) MCG/ACT inhaler Inhale 1-2 puffs into the lungs every 6 (six) hours as needed for wheezing or shortness of breath. 8 g 1   amLODipine  (NORVASC ) 10 MG tablet Take 1 tablet (10 mg total) by mouth daily. 90 tablet 2   celecoxib  (CELEBREX ) 200 MG capsule TAKE 1 CAPSULE BY MOUTH TWICE A DAY 60 capsule 0   docusate sodium  (COLACE) 100 MG capsule Take 1 capsule (100 mg total) by mouth 2 (two) times daily as needed for mild constipation. 100 capsule 0   EPINEPHrine  0.3 mg/0.3 mL IJ SOAJ injection Inject into the muscle.     FLUoxetine (PROZAC) 20 MG capsule Take 20 mg by mouth every morning.     Multiple Vitamin (MULTI-VITAMIN) tablet Take by mouth.     Omega-3 Fatty Acids (FISH OIL) 300 MG CAPS Take by mouth.     polyethylene glycol powder (GLYCOLAX /MIRALAX ) 17 GM/SCOOP powder Take 17 g by mouth 2 (two) times daily as needed. 3350 g 1   triamterene -hydrochlorothiazide  (DYAZIDE) 37.5-25 MG capsule Take 1 each (1 capsule total) by mouth daily. 90 capsule 1   ZEPBOUND 2.5 MG/0.5ML Pen Inject 2.5 mg into the skin once a week.     No current facility-administered medications for this visit.   No results found.  Review of Systems:   A ROS was performed including pertinent positives and negatives as documented in the HPI.  Physical Exam :   Constitutional: NAD and appears stated age Neurological: Alert and oriented Psych: Appropriate affect and cooperative There were no vitals taken for this visit.   Comprehensive Musculoskeletal Exam:    Left hip with positive FADIR maneuver 90 degrees of flexion with 30 degrees internal rotation causes pain.  45 degrees external rotation improves this.  Distal neurosensory exam is intact   Imaging:   Xray (4 views left hip): Cam lesion with alpha angle measuring 60 degrees  MRI (left hip): Cam  lesion measuring 60 degrees with anterior superior labral tear   I personally reviewed and interpreted the radiographs.   Assessment and Plan:   43 y.o. 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  At this time we discussed arthroscopic intervention for arthroscopy, labral repair, cam debridement given the fact that he has failed conservative management with physical therapy and an injection.  I did discuss risks and limitations as well as associated recovery timeframe.  After discussion he would like to proceed  -Plan for left hip arthroscopy with labral repair and cam debridement   After a lengthy discussion of treatment options, including risks, benefits, alternatives, complications of surgical and nonsurgical conservative options, the patient elected surgical repair.   The patient  is aware of the material risks  and complications including, but not limited to injury to adjacent structures, neurovascular injury, infection, numbness, bleeding, implant failure, thermal burns, stiffness, persistent pain, failure to heal, disease transmission from allograft, need for further surgery, dislocation, anesthetic risks, blood clots, risks of death,and others. The probabilities of surgical success and failure discussed with patient given their particular co-morbidities.The time and nature of expected rehabilitation and recovery was discussed.The patient's questions were all answered preoperatively.  No barriers to understanding were noted. I explained the natural history of the disease process and Rx rationale.  I explained to the patient what I considered to be reasonable expectations given their personal situation.  The final treatment plan was arrived at through a shared patient decision making process model.    I personally saw and evaluated the patient, and participated in the management and treatment  plan.  Wilhelmenia Harada, MD Attending Physician, Orthopedic Surgery  This document was dictated using Dragon voice recognition software. A reasonable attempt at proof reading has been made to minimize errors.

## 2023-08-01 ENCOUNTER — Other Ambulatory Visit: Payer: Self-pay | Admitting: Family Medicine

## 2023-08-05 ENCOUNTER — Other Ambulatory Visit (HOSPITAL_BASED_OUTPATIENT_CLINIC_OR_DEPARTMENT_OTHER): Payer: Self-pay | Admitting: Orthopaedic Surgery

## 2023-08-05 DIAGNOSIS — S73192A Other sprain of left hip, initial encounter: Secondary | ICD-10-CM

## 2023-08-14 ENCOUNTER — Telehealth: Payer: Self-pay | Admitting: Orthopaedic Surgery

## 2023-08-14 NOTE — Telephone Encounter (Signed)
 FMLA form received. Prepay letter/auth mailed to patient.

## 2023-08-19 DIAGNOSIS — F4312 Post-traumatic stress disorder, chronic: Secondary | ICD-10-CM | POA: Diagnosis not present

## 2023-08-19 DIAGNOSIS — F411 Generalized anxiety disorder: Secondary | ICD-10-CM | POA: Diagnosis not present

## 2023-08-19 DIAGNOSIS — F332 Major depressive disorder, recurrent severe without psychotic features: Secondary | ICD-10-CM | POA: Diagnosis not present

## 2023-08-22 ENCOUNTER — Other Ambulatory Visit: Payer: Self-pay | Admitting: Orthopedic Surgery

## 2023-08-22 DIAGNOSIS — S73192A Other sprain of left hip, initial encounter: Secondary | ICD-10-CM

## 2023-08-26 ENCOUNTER — Telehealth: Payer: Self-pay | Admitting: Orthopaedic Surgery

## 2023-08-26 NOTE — Telephone Encounter (Signed)
 Pt came in office to get information about surgery on this Thursday. Best call back number [phone]

## 2023-08-28 DIAGNOSIS — E669 Obesity, unspecified: Secondary | ICD-10-CM | POA: Diagnosis not present

## 2023-09-01 NOTE — Telephone Encounter (Signed)
 I left patient a message on voicemail to call back so I can go over surgery information with him for his upcoming surgery on 09/03/23.

## 2023-09-03 ENCOUNTER — Encounter (HOSPITAL_BASED_OUTPATIENT_CLINIC_OR_DEPARTMENT_OTHER): Payer: Self-pay | Admitting: Orthopaedic Surgery

## 2023-09-03 DIAGNOSIS — S73192A Other sprain of left hip, initial encounter: Secondary | ICD-10-CM | POA: Diagnosis not present

## 2023-09-03 DIAGNOSIS — M25852 Other specified joint disorders, left hip: Secondary | ICD-10-CM | POA: Diagnosis not present

## 2023-09-03 NOTE — Progress Notes (Unsigned)
 Date of Surgery: 09/03/2023  INDICATIONS: Dominic Joseph is a 43 y.o.-year-old male with left hip femoracetabular impingment.  The risk and benefits of the procedure were discussed in detail and documented in the pre-operative evaluation.   PREOPERATIVE DIAGNOSIS: 1. Left hip femoroacetabular impingment  POSTOPERATIVE DIAGNOSIS: Same.  PROCEDURE: 1. Left hip labral repair 2. Left hip CAM debridement  SURGEON: Carmina Chris MD  ASSISTANT: Deon Flatter, ATC  ANESTHESIA:  general  IV FLUIDS AND URINE: See anesthesia record.  ANTIBIOTICS: Ancef  ESTIMATED BLOOD LOSS: 5 mL.  IMPLANTS:  * No surgical log found *  DRAINS: None  CULTURES: None  COMPLICATIONS: none  DESCRIPTION OF PROCEDURE:   Cartilage Intact femoral and acetabular cartilage   Labrum Frayed appearing   Boundaries of labral tear Convention (3 o'clock anterior, 9 o'clock posterior) Anterior boundary: 3 o'clock Posterior boundary: 1 o'clock   OPERATIVE REPORT:  The patient was brought to the operating room, placed supine on the operating table, and bony prominences were padded.  The traction boots were applied with padding to ensure that safe traction could be applied through the feet.  The contralateral limb was abducted maximally and light traction was applied.  The operative leg was brought into neutral position.  The flouroscopic c-arm was brought between the legs for an AP image.  The patient was prepped and draped in a sterile fashion.  Time-out was performed and landmarks were identified. Traction was obtained and care was taken to ensure the least amount of force necessary to allow safe access to the joint of 8-18mm.  This was checked with fluoroscopy.    Next we placed an anterolateral portal under the assistance of fluoroscopy.  First, fluoroscopy was used to estimate the trajectory and starting point.  A 5mm incision with a #11 blade was made and a straight hemostat was used to dilate the portal  through the appropriate tract.  We then placed a 14-gauge hypodermic needle with careful technique to be as close to the femoral head as possible and parallel to the sorcele to ensure no iatrogenic damage to the labrum.  This released the negative pressure environment and the amount of traction was adjusted to maintain the 8-13mm of distraction.  A nitinol wire was placed through the needle and flouroscopy was used to ensure it extended to the medial wall of the acetabulum.  The Flowport from [employer] was placed over the wire and the nitinol wire was retracted to just inside the capsule during insertion of the dilator and cannula to minimize the risk of breakage. The arthroscope was placed next and we visualized the anterior triangle.     We then placed the anterior portal under direct visualization using the technique described above.  This was safely placed as well without damage to the labrum or femoral head.  We then switched our arthroscope to the anterior portal to ensure we were not through the labrum - we were safely through the capsule only.  We then proceeded with a transverse capsulotomy connecting the 2 portals in the same plane utilizing the Samurai blade from [employer].  We identified the anterior inferior iliac spine proximally, the psoas tendon medially and the rectus tendon laterally as landmarks.  We then proceeded with a diagnostic arthroscopy - the results can be found in the findings section above.    We then used the 50 degree hip specific radiofrequency device from [employer]. and a 4mm shaver to clear the superior acetabulum and expose  the subspinous region.  Next we exposed the acetabular rim leaving the chondral labral junction intact.  Working from both portals, the acetabular rim/subspinous region was reshaped with 5.5 mm bur consistent with the preoperative three-dimensional imaging.   When adequate reshaping was obtained we then proceeded with the labral  repair. We placed 2 anchors at the 1:00 and 2:30 positions. The sutures were passed using the crescent Nanopass from [employer].  This resulted in anatomic labral repair.  We debrided the loose cartilage at the rim and residual degenerative labral tissue.  Traction was let down with total traction time of 37 minutes.    A burr was introduced into DAP and used to reshape the CAM lesion under direct visualization with care to remove the bone back to an alpha angle of 55 degrees.    Finally, we performed a complete capsular closure with tape suture.  Dominic Joseph was replaced in the anterior and posterior limb of the reported capsulotomy with excellent apposition. We then removed the arthroscope and closed the incisions with 3-0 nylon simple stitches.  A sterile dressing was applied..  The patient was awakened from anesthesia and transferred to PACU in stable condition. Postoperative care includes:       POSTOPERATIVE PLAN:    Weight bearing as tolerated Formal physical therapy will begin this week. ASA 325 Daily for DVT prophylaxis      Carmina Chris, MD 2:11 PM

## 2023-09-05 ENCOUNTER — Other Ambulatory Visit: Payer: Self-pay

## 2023-09-05 ENCOUNTER — Encounter (HOSPITAL_BASED_OUTPATIENT_CLINIC_OR_DEPARTMENT_OTHER): Payer: Self-pay | Admitting: Physical Therapy

## 2023-09-05 ENCOUNTER — Ambulatory Visit (HOSPITAL_BASED_OUTPATIENT_CLINIC_OR_DEPARTMENT_OTHER): Attending: Orthopaedic Surgery | Admitting: Physical Therapy

## 2023-09-05 DIAGNOSIS — R2689 Other abnormalities of gait and mobility: Secondary | ICD-10-CM | POA: Diagnosis not present

## 2023-09-05 DIAGNOSIS — M25552 Pain in left hip: Secondary | ICD-10-CM | POA: Diagnosis not present

## 2023-09-05 DIAGNOSIS — M6281 Muscle weakness (generalized): Secondary | ICD-10-CM | POA: Insufficient documentation

## 2023-09-05 DIAGNOSIS — S73192D Other sprain of left hip, subsequent encounter: Secondary | ICD-10-CM | POA: Diagnosis not present

## 2023-09-05 DIAGNOSIS — X58XXXD Exposure to other specified factors, subsequent encounter: Secondary | ICD-10-CM | POA: Diagnosis not present

## 2023-09-05 DIAGNOSIS — R29898 Other symptoms and signs involving the musculoskeletal system: Secondary | ICD-10-CM | POA: Diagnosis not present

## 2023-09-05 NOTE — Therapy (Signed)
 OUTPATIENT PHYSICAL THERAPY LOWER EXTREMITY EVALUATION   Patient Name: Dominic Joseph MRN: [id number] DOB:[birth year], 43 y.o., male Today's Date: 09/05/2023  END OF SESSION:  PT End of Session - 09/05/23 0916     Visit Number 1    Number of Visits 24    Date for PT Re-Evaluation 11/28/23    Authorization Type [employer]    PT Start Time [phone]    PT Stop Time 0948    PT Time Calculation (min) 31 min    Activity Tolerance Patient tolerated treatment well    Behavior During Therapy [facility] for tasks assessed/performed          Past Medical History:  Diagnosis Date   Depression    Environmental allergies    Hyperlipidemia    Hypertension    Sarcoidosis of lung (HCC)    Syncope and collapse    Past Surgical History:  Procedure Laterality Date   LUNG BIOPSY Left    SHOULDER SURGERY Right    SHOULDER SURGERY Left 11/27/2021   WISDOM TOOTH EXTRACTION     Patient Active Problem List   Diagnosis Date Noted   Neutropenia (HCC) 08/15/2022   Family history of prostate cancer 06/09/2022   Healthcare maintenance 06/09/2022   Visit for suture removal 05/20/2022   Slow transit constipation 05/20/2022   Benign prostatic hyperplasia with urinary hesitancy 05/20/2022   Syncope and collapse    Cluster headaches 07/07/2019   Essential hypertension 07/07/2019   Fatigue 07/07/2019   ADD (attention deficit disorder) 07/07/2019   Severe depression (HCC) 07/07/2019   IBS (irritable colon syndrome) 07/07/2019   Elevated serum creatinine 07/07/2019   Chronic pansinusitis 11/30/2018   Perennial allergic rhinitis 11/30/2018   Tobacco use disorder 01/29/2012   Environmental allergies 01/28/2012   Plantar fascial fibromatosis 12/23/2011   Asthma 07/25/2011   Sarcoidosis 07/25/2011   Pain in joint involving lower leg 06/10/2011   Primary localized osteoarthrosis, lower leg 06/10/2011    PCP: Tonna Frederic, MD  REFERRING PROVIDER: Wilhelmenia Harada, MD  REFERRING DIAG: [phone]  (ICD-10-CM) - Tear of left acetabular labrum, initial encounter; L hip arthroscopy with labral repair DOS 09/03/23  THERAPY DIAG:  Pain in left hip  Muscle weakness (generalized)  Other abnormalities of gait and mobility  Other symptoms and signs involving the musculoskeletal system  Rationale for Evaluation and Treatment: Rehabilitation  ONSET DATE:  DOS 09/03/23  SUBJECTIVE:   SUBJECTIVE STATEMENT: Patient states he has not been putting weight on his leg yet with walking. Sleeping in recliner is more comfortable than bed. Using crutches. Normally active: weightlifting, riding bike, etc.   PERTINENT HISTORY: HTN, HLD, sarcoidosis of lung, hx LBP PAIN:  Are you having pain? Yes: NPRS scale: 7/10 Pain location: L hip Pain description: all of the above Aggravating factors: sleeping in bed Relieving factors: rest  PRECAUTIONS: Other: L hip arthroscopy with labral repair   WEIGHT BEARING RESTRICTIONS: WBAT  FALLS:  Has patient fallen in last 6 months? No  OCCUPATION: [employer]  PLOF: Independent  PATIENT GOALS: strengthening, decrease pain, rehab from surgery  OBJECTIVE: (objective measures from initial evaluation unless otherwise dated)  Observation: sutures intact, No s/s of infection  PATIENT SURVEYS:  LEFS  Extreme difficulty/unable (0), Quite a bit of difficulty (1), Moderate difficulty (2), Little difficulty (3), No difficulty (4) Survey date:  09/05/23  Any of your usual work, housework or school activities 0  2. Usual hobbies, recreational or sporting activities 0  3. Getting into/out of the bath  0  4. Walking between rooms 1  5. Putting on socks/shoes 1  6. Squatting  1  7. Lifting an object, like a bag of groceries from the floor 1  8. Performing light activities around your home 2  9. Performing heavy activities around your home 0  10. Getting into/out of a car 1  11. Walking 2 blocks 1  12. Walking 1 mile 0  13. Going up/down 10 stairs (1 flight)  1  14. Standing for 1 hour 1  15.  sitting for 1 hour 1  16. Running on even ground 0  17. Running on uneven ground 0  18. Making sharp turns while running fast 0  19. Hopping  0  20. Rolling over in bed 1  Score total:  12/80     COGNITION: Overall cognitive status: Within functional limits for tasks assessed     SENSATION: WFL  POSTURE: No Significant postural limitations  PALPATION: Grossly Tender L hip  LOWER EXTREMITY ROM:  Passive ROM Right eval Left eval  Hip flexion  90  Hip extension    Hip abduction  30  Hip adduction    Hip internal rotation  20  Hip external rotation  20  Knee flexion    Knee extension    Ankle dorsiflexion    Ankle plantarflexion    Ankle inversion    Ankle eversion     (Blank rows = not tested) *= pain/symptoms  LOWER EXTREMITY MMT:  MMT Right eval Left eval  Hip flexion    Hip extension    Hip abduction    Hip adduction    Hip internal rotation    Hip external rotation    Knee flexion 5 4- *  Knee extension 5 4  Ankle dorsiflexion    Ankle plantarflexion    Ankle inversion    Ankle eversion     (Blank rows = not tested) *= pain/symptoms   FUNCTIONAL TESTS:  NT due to post op status  GAIT: Distance walked: 100 feet Assistive device utilized: Crutches Level of assistance: Modified independence Comments: NWB L hip; able to ambulate with bilateral axillary crutches following cueing/education   TODAY'S TREATMENT:                                                                                                                              DATE:  09/05/23 Eval, HEP, Education, dressing change, gait mechanics   PATIENT EDUCATION:  Education details: Patient educated on exam findings, POC, scope of PT, HEP, protocol/precaution, gait mechanics, and dressing change. Person educated: Patient Education method: Explanation, Demonstration, and Handouts Education comprehension: verbalized understanding, returned  demonstration, verbal cues required, and tactile cues required  HOME EXERCISE PROGRAM: Access Code: [email or web address] URL: [email or web address] Date: 09/05/2023 Prepared by: Debria Fang Geanette Buonocore  Exercises - Gluteal Sets (Mirrored)  - 3 x daily - 7 x weekly - 2 sets - 10 reps - 5-10 second hold - Supine Quadricep Sets (Mirrored)  -  3 x daily - 7 x weekly - 2 sets - 10 reps - 5-10 second hold - Clamshell (Mirrored)  - 3 x daily - 7 x weekly - 2 sets - 10 reps - Bent Knee Fallouts (Mirrored)  - 3 x daily - 7 x weekly - 2 sets - 10 reps  ASSESSMENT:  CLINICAL IMPRESSION: Patient a 43 y.o. y.o. male who was seen today for physical therapy evaluation and treatment for L hip arthroscopy with labral repair DOS 09/03/23. Patient presents with pain limited deficits in L hip strength, ROM, endurance, activity tolerance, gait, balance and functional mobility with ADL. Patient is having to modify and restrict ADL as indicated by outcome measure score as well as subjective information and objective measures which is affecting overall participation. Patient will benefit from skilled physical therapy in order to improve function and reduce impairment.  OBJECTIVE IMPAIRMENTS: Abnormal gait, decreased activity tolerance, decreased balance, decreased endurance, decreased mobility, difficulty walking, decreased ROM, decreased strength, increased muscle spasms, impaired flexibility, improper body mechanics, and pain.   ACTIVITY LIMITATIONS: carrying, lifting, bending, standing, squatting, sleeping, stairs, transfers, bed mobility, bathing, toileting, dressing, hygiene/grooming, locomotion level, and caring for others  PARTICIPATION LIMITATIONS: meal prep, cleaning, laundry, driving, shopping, community activity, occupation, and yard work  PERSONAL FACTORS: Time since onset of injury/illness/exacerbation and 3+ comorbidities: HTN, HLD, sarcoidosis of lung are also affecting patient's functional outcome.    REHAB POTENTIAL: Good  CLINICAL DECISION MAKING: Stable/uncomplicated  EVALUATION COMPLEXITY: Low   GOALS: Goals reviewed with patient? Yes  SHORT TERM GOALS: Target date: 10/03/2023    Patient will be independent with HEP in order to improve functional outcomes. Baseline: Goal status: INITIAL  2.  Patient will report at least 25% improvement in symptoms for improved quality of life. Baseline: Goal status: INITIAL  3.  SLS 30s without compensation Baseline:  Goal status: INITIAL      LONG TERM GOALS: Target date: 11/28/2023    Patient will report at least 75% improvement in symptoms for improved quality of life. Baseline:  Goal status: INITIAL  2.  Patient will improve LEFS score by at least 48 points in order to indicate improved tolerance to activity. Baseline: 12/80 Goal status: INITIAL  3.  Patient will be able to navigate stairs with reciprocal pattern without compensation in order to demonstrate improved LE strength. Baseline:  Goal status: INITIAL  4.  Patient will demonstrate MMT 90% of opposite lower extremity in all tested musculature as evidence of improved strength to assist with stair ambulation and gait. Baseline:  Goal status: INITIAL  5.  Patient will be able to return to all activities unrestricted for improved ability to perform work functions and participate with family.  Baseline:  Goal status: INITIAL    PLAN:  PT FREQUENCY: 1-2x/week  PT DURATION: 12 weeks  PLANNED INTERVENTIONS: 97164- PT Re-evaluation, 97110-Therapeutic exercises, 97530- Therapeutic activity, [id number]- Neuromuscular re-education, 97535- Self Care, [id number]- Manual therapy, [id number]- Gait training, [phone]- Orthotic Fit/training, [phone]- Canalith repositioning, [id number]- Aquatic Therapy, [phone]- Splinting, [phone]- Wound care (first 20 sq cm), 97598- Wound care (each additional 20 sq cm)Patient/Family education, Balance training, Stair training, Taping, Dry Needling, Joint  mobilization, Joint manipulation, Spinal manipulation, Spinal mobilization, Scar mobilization, and DME instructions.  PLAN FOR NEXT SESSION: progress with hip labral repair protocol    Beather Liming, PT, DPT 09/05/2023, 9:52 AM

## 2023-09-10 ENCOUNTER — Ambulatory Visit (HOSPITAL_BASED_OUTPATIENT_CLINIC_OR_DEPARTMENT_OTHER): Admitting: Physical Therapy

## 2023-09-10 ENCOUNTER — Encounter (HOSPITAL_BASED_OUTPATIENT_CLINIC_OR_DEPARTMENT_OTHER): Payer: Self-pay | Admitting: Physical Therapy

## 2023-09-10 DIAGNOSIS — S73192D Other sprain of left hip, subsequent encounter: Secondary | ICD-10-CM | POA: Diagnosis not present

## 2023-09-10 DIAGNOSIS — M25552 Pain in left hip: Secondary | ICD-10-CM

## 2023-09-10 DIAGNOSIS — R29898 Other symptoms and signs involving the musculoskeletal system: Secondary | ICD-10-CM

## 2023-09-10 DIAGNOSIS — R2689 Other abnormalities of gait and mobility: Secondary | ICD-10-CM | POA: Diagnosis not present

## 2023-09-10 DIAGNOSIS — M6281 Muscle weakness (generalized): Secondary | ICD-10-CM | POA: Diagnosis not present

## 2023-09-10 NOTE — Therapy (Signed)
 OUTPATIENT PHYSICAL THERAPY TREATMENT   Patient Name: Dominic Joseph MRN: [id number] DOB:December 15, 1980, 43 y.o., male Today's Date: 09/10/2023  END OF SESSION:  PT End of Session - 09/10/23 1516     Visit Number 2    Number of Visits 24    Date for PT Re-Evaluation 11/28/23    Authorization Type [employer]    PT Start Time 1516    PT Stop Time 1558    PT Time Calculation (min) 42 min    Activity Tolerance Patient tolerated treatment well    Behavior During Therapy WFL for tasks assessed/performed          Past Medical History:  Diagnosis Date   Depression    Environmental allergies    Hyperlipidemia    Hypertension    Sarcoidosis of lung (HCC)    Syncope and collapse    Past Surgical History:  Procedure Laterality Date   LUNG BIOPSY Left    SHOULDER SURGERY Right    SHOULDER SURGERY Left 11/27/2021   WISDOM TOOTH EXTRACTION     Patient Active Problem List   Diagnosis Date Noted   Neutropenia (HCC) 08/15/2022   Family history of prostate cancer 06/09/2022   Healthcare maintenance 06/09/2022   Visit for suture removal 05/20/2022   Slow transit constipation 05/20/2022   Benign prostatic hyperplasia with urinary hesitancy 05/20/2022   Syncope and collapse    Cluster headaches 07/07/2019   Essential hypertension 07/07/2019   Fatigue 07/07/2019   ADD (attention deficit disorder) 07/07/2019   Severe depression (HCC) 07/07/2019   IBS (irritable colon syndrome) 07/07/2019   Elevated serum creatinine 07/07/2019   Chronic pansinusitis 11/30/2018   Perennial allergic rhinitis 11/30/2018   Tobacco use disorder 01/29/2012   Environmental allergies 01/28/2012   Plantar fascial fibromatosis 12/23/2011   Asthma 07/25/2011   Sarcoidosis 07/25/2011   Pain in joint involving lower leg 06/10/2011   Primary localized osteoarthrosis, lower leg 06/10/2011    PCP: Tonna Frederic, MD  REFERRING PROVIDER: Wilhelmenia Harada, MD  REFERRING DIAG: [phone] (ICD-10-CM) - Tear of  left acetabular labrum, initial encounter; L hip arthroscopy with labral repair DOS 09/03/23  THERAPY DIAG:  Pain in left hip  Muscle weakness (generalized)  Other abnormalities of gait and mobility  Other symptoms and signs involving the musculoskeletal system  Rationale for Evaluation and Treatment: Rehabilitation  ONSET DATE:  DOS 09/03/23  SUBJECTIVE:   SUBJECTIVE STATEMENT: Patient states been using cane. HEP going well.   EVAL: Patient states he has not been putting weight on his leg yet with walking. Sleeping in recliner is more comfortable than bed. Using crutches. Normally active: weightlifting, riding bike, etc.   PERTINENT HISTORY: HTN, HLD, sarcoidosis of lung, hx LBP PAIN:  Are you having pain? Yes: NPRS scale: 5/10 Pain location: L hip Pain description: all of the above Aggravating factors: sleeping in bed Relieving factors: rest  PRECAUTIONS: Other: L hip arthroscopy with labral repair   WEIGHT BEARING RESTRICTIONS: WBAT  FALLS:  Has patient fallen in last 6 months? No  OCCUPATION: [employer]  PLOF: Independent  PATIENT GOALS: strengthening, decrease pain, rehab from surgery  OBJECTIVE: (objective measures from initial evaluation unless otherwise dated)  Observation: sutures intact, No s/s of infection  PATIENT SURVEYS:  LEFS  Extreme difficulty/unable (0), Quite a bit of difficulty (1), Moderate difficulty (2), Little difficulty (3), No difficulty (4) Survey date:  09/05/23  Any of your usual work, housework or school activities 0  2. Usual hobbies, recreational or sporting  activities 0  3. Getting into/out of the bath 0  4. Walking between rooms 1  5. Putting on socks/shoes 1  6. Squatting  1  7. Lifting an object, like a bag of groceries from the floor 1  8. Performing light activities around your home 2  9. Performing heavy activities around your home 0  10. Getting into/out of a car 1  11. Walking 2 blocks 1  12. Walking 1 mile 0   13. Going up/down 10 stairs (1 flight) 1  14. Standing for 1 hour 1  15.  sitting for 1 hour 1  16. Running on even ground 0  17. Running on uneven ground 0  18. Making sharp turns while running fast 0  19. Hopping  0  20. Rolling over in bed 1  Score total:  12/80     COGNITION: Overall cognitive status: Within functional limits for tasks assessed     SENSATION: WFL  POSTURE: No Significant postural limitations  PALPATION: Grossly Tender L hip  LOWER EXTREMITY ROM:  Passive ROM Right eval Left eval  Hip flexion  90  Hip extension    Hip abduction  30  Hip adduction    Hip internal rotation  20  Hip external rotation  20  Knee flexion    Knee extension    Ankle dorsiflexion    Ankle plantarflexion    Ankle inversion    Ankle eversion     (Blank rows = not tested) *= pain/symptoms  LOWER EXTREMITY MMT:  MMT Right eval Left eval  Hip flexion    Hip extension    Hip abduction    Hip adduction    Hip internal rotation    Hip external rotation    Knee flexion 5 4- *  Knee extension 5 4  Ankle dorsiflexion    Ankle plantarflexion    Ankle inversion    Ankle eversion     (Blank rows = not tested) *= pain/symptoms   FUNCTIONAL TESTS:  NT due to post op status  GAIT: Distance walked: 100 feet Assistive device utilized: Crutches Level of assistance: Modified independence Comments: NWB L hip; able to ambulate with bilateral axillary crutches following cueing/education   TODAY'S TREATMENT:                                                                                                                              DATE:  09/10/23 Gait mechanics with glute set with SPC Bent knee fall outs 2 x 10 Sidelying clam 3 x 10 Prone hamstring curl 3# 2 x 10 Prone hip ER/IR small range 2 x 10  Quadruped rocking 2 x 10 Quadruped pelvic tilts 2 x 10 Dressing change   09/05/23 Eval, HEP, Education, dressing change, gait mechanics   PATIENT EDUCATION:   Education details: Patient educated on exam findings, POC, scope of PT, HEP, protocol/precaution, gait mechanics, and dressing change. 09/10/23: HEP, proper activity levels/progressions Person educated:  Patient Education method: Explanation, Demonstration, and Handouts Education comprehension: verbalized understanding, returned demonstration, verbal cues required, and tactile cues required  HOME EXERCISE PROGRAM: Access Code: [email or web address] URL: [email or web address] Date: 09/05/2023 Prepared by: Debria Fang Anna-Marie Coller  Exercises - Gluteal Sets (Mirrored)  - 3 x daily - 7 x weekly - 2 sets - 10 reps - 5-10 second hold - Supine Quadricep Sets (Mirrored)  - 3 x daily - 7 x weekly - 2 sets - 10 reps - 5-10 second hold - Clamshell (Mirrored)  - 3 x daily - 7 x weekly - 2 sets - 10 reps - Bent Knee Fallouts (Mirrored)  - 3 x daily - 7 x weekly - 2 sets - 10 reps  ASSESSMENT:  CLINICAL IMPRESSION: Patient overall progressing well. Cueing for glute activation in stance and mechanics with SPC with improvement in gait following. Exercises tolerated well and changed dressing today. Educated on proper activity levels/progressions. Patient will continue to benefit from physical therapy in order to improve function and reduce impairment.   OBJECTIVE IMPAIRMENTS: Abnormal gait, decreased activity tolerance, decreased balance, decreased endurance, decreased mobility, difficulty walking, decreased ROM, decreased strength, increased muscle spasms, impaired flexibility, improper body mechanics, and pain.   ACTIVITY LIMITATIONS: carrying, lifting, bending, standing, squatting, sleeping, stairs, transfers, bed mobility, bathing, toileting, dressing, hygiene/grooming, locomotion level, and caring for others  PARTICIPATION LIMITATIONS: meal prep, cleaning, laundry, driving, shopping, community activity, occupation, and yard work  PERSONAL FACTORS: Time since onset of injury/illness/exacerbation and 3+  comorbidities: HTN, HLD, sarcoidosis of lung are also affecting patient's functional outcome.   REHAB POTENTIAL: Good  CLINICAL DECISION MAKING: Stable/uncomplicated  EVALUATION COMPLEXITY: Low   GOALS: Goals reviewed with patient? Yes  SHORT TERM GOALS: Target date: 10/03/2023    Patient will be independent with HEP in order to improve functional outcomes. Baseline: Goal status: INITIAL  2.  Patient will report at least 25% improvement in symptoms for improved quality of life. Baseline: Goal status: INITIAL  3.  SLS 30s without compensation Baseline:  Goal status: INITIAL      LONG TERM GOALS: Target date: 11/28/2023    Patient will report at least 75% improvement in symptoms for improved quality of life. Baseline:  Goal status: INITIAL  2.  Patient will improve LEFS score by at least 48 points in order to indicate improved tolerance to activity. Baseline: 12/80 Goal status: INITIAL  3.  Patient will be able to navigate stairs with reciprocal pattern without compensation in order to demonstrate improved LE strength. Baseline:  Goal status: INITIAL  4.  Patient will demonstrate MMT 90% of opposite lower extremity in all tested musculature as evidence of improved strength to assist with stair ambulation and gait. Baseline:  Goal status: INITIAL  5.  Patient will be able to return to all activities unrestricted for improved ability to perform work functions and participate with family.  Baseline:  Goal status: INITIAL    PLAN:  PT FREQUENCY: 1-2x/week  PT DURATION: 12 weeks  PLANNED INTERVENTIONS: 97164- PT Re-evaluation, 97110-Therapeutic exercises, 97530- Therapeutic activity, [id number]- Neuromuscular re-education, 97535- Self Care, 09811- Manual therapy, [id number]- Gait training, [phone]- Orthotic Fit/training, [phone]- Canalith repositioning, [id number]- Aquatic Therapy, [phone]- Splinting, [phone]- Wound care (first 20 sq cm), 97598- Wound care (each additional 20 sq  cm)Patient/Family education, Balance training, Stair training, Taping, Dry Needling, Joint mobilization, Joint manipulation, Spinal manipulation, Spinal mobilization, Scar mobilization, and DME instructions.  PLAN FOR NEXT SESSION: progress with hip labral repair protocol    Beather Liming, PT, DPT  09/10/2023, 4:04 PM

## 2023-09-17 ENCOUNTER — Ambulatory Visit (HOSPITAL_BASED_OUTPATIENT_CLINIC_OR_DEPARTMENT_OTHER): Admitting: Orthopaedic Surgery

## 2023-09-17 ENCOUNTER — Ambulatory Visit (HOSPITAL_BASED_OUTPATIENT_CLINIC_OR_DEPARTMENT_OTHER): Admitting: Physical Therapy

## 2023-09-17 ENCOUNTER — Encounter (HOSPITAL_BASED_OUTPATIENT_CLINIC_OR_DEPARTMENT_OTHER): Payer: Self-pay | Admitting: Physical Therapy

## 2023-09-17 DIAGNOSIS — M25552 Pain in left hip: Secondary | ICD-10-CM

## 2023-09-17 DIAGNOSIS — S73192D Other sprain of left hip, subsequent encounter: Secondary | ICD-10-CM | POA: Diagnosis not present

## 2023-09-17 DIAGNOSIS — R29898 Other symptoms and signs involving the musculoskeletal system: Secondary | ICD-10-CM | POA: Diagnosis not present

## 2023-09-17 DIAGNOSIS — M6281 Muscle weakness (generalized): Secondary | ICD-10-CM

## 2023-09-17 DIAGNOSIS — S73192A Other sprain of left hip, initial encounter: Secondary | ICD-10-CM

## 2023-09-17 DIAGNOSIS — R2689 Other abnormalities of gait and mobility: Secondary | ICD-10-CM

## 2023-09-17 NOTE — Therapy (Signed)
 OUTPATIENT PHYSICAL THERAPY TREATMENT   Patient Name: Dominic Joseph MRN: [id number] DOB:1980-09-25, 43 y.o., male Today's Date: 09/17/2023  END OF SESSION:  PT End of Session - 09/17/23 1100     Visit Number 3    Number of Visits 24    Date for PT Re-Evaluation 11/28/23    Authorization Type [employer]    PT Start Time 1100    PT Stop Time 1140    PT Time Calculation (min) 40 min    Activity Tolerance Patient tolerated treatment well    Behavior During Therapy WFL for tasks assessed/performed          Past Medical History:  Diagnosis Date   Depression    Environmental allergies    Hyperlipidemia    Hypertension    Sarcoidosis of lung (HCC)    Syncope and collapse    Past Surgical History:  Procedure Laterality Date   LUNG BIOPSY Left    SHOULDER SURGERY Right    SHOULDER SURGERY Left 11/27/2021   WISDOM TOOTH EXTRACTION     Patient Active Problem List   Diagnosis Date Noted   Neutropenia (HCC) 08/15/2022   Family history of prostate cancer 06/09/2022   Healthcare maintenance 06/09/2022   Visit for suture removal 05/20/2022   Slow transit constipation 05/20/2022   Benign prostatic hyperplasia with urinary hesitancy 05/20/2022   Syncope and collapse    Cluster headaches 07/07/2019   Essential hypertension 07/07/2019   Fatigue 07/07/2019   ADD (attention deficit disorder) 07/07/2019   Severe depression (HCC) 07/07/2019   IBS (irritable colon syndrome) 07/07/2019   Elevated serum creatinine 07/07/2019   Chronic pansinusitis 11/30/2018   Perennial allergic rhinitis 11/30/2018   Tobacco use disorder 01/29/2012   Environmental allergies 01/28/2012   Plantar fascial fibromatosis 12/23/2011   Asthma 07/25/2011   Sarcoidosis 07/25/2011   Pain in joint involving lower leg 06/10/2011   Primary localized osteoarthrosis, lower leg 06/10/2011    PCP: Berneta Elsie Sayre, MD  REFERRING PROVIDER: Genelle Elspeth, MD  REFERRING DIAG: [id number] (ICD-10-CM) - Tear of  left acetabular labrum, initial encounter; L hip arthroscopy with labral repair DOS 09/03/23  THERAPY DIAG:  Pain in left hip  Muscle weakness (generalized)  Other abnormalities of gait and mobility  Other symptoms and signs involving the musculoskeletal system  Rationale for Evaluation and Treatment: Rehabilitation  ONSET DATE:  DOS 09/03/23  SUBJECTIVE:   SUBJECTIVE STATEMENT: Patient states felt alright after last time. HEP going well. Still using cane.   EVAL: Patient states he has not been putting weight on his leg yet with walking. Sleeping in recliner is more comfortable than bed. Using crutches. Normally active: weightlifting, riding bike, etc.   PERTINENT HISTORY: HTN, HLD, sarcoidosis of lung, hx LBP PAIN:  Are you having pain? Yes: NPRS scale: 2/10 Pain location: L hip Pain description: all of the above Aggravating factors: sleeping in bed Relieving factors: rest  PRECAUTIONS: Other: L hip arthroscopy with labral repair   WEIGHT BEARING RESTRICTIONS: WBAT  FALLS:  Has patient fallen in last 6 months? No  OCCUPATION: [employer]  PLOF: Independent  PATIENT GOALS: strengthening, decrease pain, rehab from surgery  OBJECTIVE: (objective measures from initial evaluation unless otherwise dated)  Observation: sutures intact, No s/s of infection  PATIENT SURVEYS:  LEFS  Extreme difficulty/unable (0), Quite a bit of difficulty (1), Moderate difficulty (2), Little difficulty (3), No difficulty (4) Survey date:  09/05/23  Any of your usual work, housework or school activities 0  2.  Usual hobbies, recreational or sporting activities 0  3. Getting into/out of the bath 0  4. Walking between rooms 1  5. Putting on socks/shoes 1  6. Squatting  1  7. Lifting an object, like a bag of groceries from the floor 1  8. Performing light activities around your home 2  9. Performing heavy activities around your home 0  10. Getting into/out of a car 1  11. Walking 2  blocks 1  12. Walking 1 mile 0  13. Going up/down 10 stairs (1 flight) 1  14. Standing for 1 hour 1  15.  sitting for 1 hour 1  16. Running on even ground 0  17. Running on uneven ground 0  18. Making sharp turns while running fast 0  19. Hopping  0  20. Rolling over in bed 1  Score total:  12/80     COGNITION: Overall cognitive status: Within functional limits for tasks assessed     SENSATION: WFL  POSTURE: No Significant postural limitations  PALPATION: Grossly Tender L hip  LOWER EXTREMITY ROM:  Passive ROM Right eval Left eval  Hip flexion  90  Hip extension    Hip abduction  30  Hip adduction    Hip internal rotation  20  Hip external rotation  20  Knee flexion    Knee extension    Ankle dorsiflexion    Ankle plantarflexion    Ankle inversion    Ankle eversion     (Blank rows = not tested) *= pain/symptoms  LOWER EXTREMITY MMT:  MMT Right eval Left eval  Hip flexion    Hip extension    Hip abduction    Hip adduction    Hip internal rotation    Hip external rotation    Knee flexion 5 4- *  Knee extension 5 4  Ankle dorsiflexion    Ankle plantarflexion    Ankle inversion    Ankle eversion     (Blank rows = not tested) *= pain/symptoms   FUNCTIONAL TESTS:  NT due to post op status  GAIT: Distance walked: 100 feet Assistive device utilized: Crutches Level of assistance: Modified independence Comments: NWB L hip; able to ambulate with bilateral axillary crutches following cueing/education   TODAY'S TREATMENT:                                                                                                                              DATE:  09/17/23 Bridge 3 x 10 Sidelying reverse clam 2 x 10 Prone hamstring curl 5# 3 x 10 Prone hip extension 3 x 10 Prone hip ER/IR small range 2 x 10 Quadruped rocking 2 x 10 Quadruped pelvic tilts 2 x 10 Quadruped shoulder flexion 2 x 10 Tall kneeling hip hinge 2 x 10 Gait mechanics with glute set with  SPC Manual: STM to hip flexors/adductors  09/10/23 Gait mechanics with glute set with SPC Bent knee fall outs 2 x 10  Sidelying clam 3 x 10 Prone hamstring curl 3# 2 x 10 Prone hip ER/IR small range 2 x 10  Quadruped rocking 2 x 10 Quadruped pelvic tilts 2 x 10 Dressing change   09/05/23 Eval, HEP, Education, dressing change, gait mechanics   PATIENT EDUCATION:  Education details: Patient educated on exam findings, POC, scope of PT, HEP, protocol/precaution, gait mechanics, and dressing change. 09/10/23: HEP, proper activity levels/progressions Person educated: Patient Education method: Explanation, Demonstration, and Handouts Education comprehension: verbalized understanding, returned demonstration, verbal cues required, and tactile cues required  HOME EXERCISE PROGRAM: Access Code: [email or web address] URL: [email or web address] Date: 09/05/2023 Prepared by: Prentice Tava Peery  Exercises - Gluteal Sets (Mirrored)  - 3 x daily - 7 x weekly - 2 sets - 10 reps - 5-10 second hold - Supine Quadricep Sets (Mirrored)  - 3 x daily - 7 x weekly - 2 sets - 10 reps - 5-10 second hold - Clamshell (Mirrored)  - 3 x daily - 7 x weekly - 2 sets - 10 reps - Bent Knee Fallouts (Mirrored)  - 3 x daily - 7 x weekly - 2 sets - 10 reps  ASSESSMENT:  CLINICAL IMPRESSION: Patient continues to progress well. Tolerating progressions in glute strengthening with good mechanics after initial cueing. Cueing for glute activation in stance. Hyperactive and tender adductors/hip flexors which improves with manual. Patient will continue to benefit from physical therapy in order to improve function and reduce impairment.   OBJECTIVE IMPAIRMENTS: Abnormal gait, decreased activity tolerance, decreased balance, decreased endurance, decreased mobility, difficulty walking, decreased ROM, decreased strength, increased muscle spasms, impaired flexibility, improper body mechanics, and pain.   ACTIVITY LIMITATIONS:  carrying, lifting, bending, standing, squatting, sleeping, stairs, transfers, bed mobility, bathing, toileting, dressing, hygiene/grooming, locomotion level, and caring for others  PARTICIPATION LIMITATIONS: meal prep, cleaning, laundry, driving, shopping, community activity, occupation, and yard work  PERSONAL FACTORS: Time since onset of injury/illness/exacerbation and 3+ comorbidities: HTN, HLD, sarcoidosis of lung are also affecting patient's functional outcome.   REHAB POTENTIAL: Good  CLINICAL DECISION MAKING: Stable/uncomplicated  EVALUATION COMPLEXITY: Low   GOALS: Goals reviewed with patient? Yes  SHORT TERM GOALS: Target date: 10/03/2023    Patient will be independent with HEP in order to improve functional outcomes. Baseline: Goal status: INITIAL  2.  Patient will report at least 25% improvement in symptoms for improved quality of life. Baseline: Goal status: INITIAL  3.  SLS 30s without compensation Baseline:  Goal status: INITIAL      LONG TERM GOALS: Target date: 11/28/2023    Patient will report at least 75% improvement in symptoms for improved quality of life. Baseline:  Goal status: INITIAL  2.  Patient will improve LEFS score by at least 48 points in order to indicate improved tolerance to activity. Baseline: 12/80 Goal status: INITIAL  3.  Patient will be able to navigate stairs with reciprocal pattern without compensation in order to demonstrate improved LE strength. Baseline:  Goal status: INITIAL  4.  Patient will demonstrate MMT 90% of opposite lower extremity in all tested musculature as evidence of improved strength to assist with stair ambulation and gait. Baseline:  Goal status: INITIAL  5.  Patient will be able to return to all activities unrestricted for improved ability to perform work functions and participate with family.  Baseline:  Goal status: INITIAL    PLAN:  PT FREQUENCY: 1-2x/week  PT DURATION: 12 weeks  PLANNED  INTERVENTIONS: 97164- PT Re-evaluation, 97110-Therapeutic exercises, 97530- Therapeutic activity, [id number]- Neuromuscular re-education, 97535-  Self Care, 02859- Manual therapy, [id number]- Gait training, [phone]- Orthotic Fit/training, [phone]- Canalith repositioning, [id number]- Aquatic Therapy, [phone]- Splinting, [phone]- Wound care (first 20 sq cm), 97598- Wound care (each additional 20 sq cm)Patient/Family education, Balance training, Stair training, Taping, Dry Needling, Joint mobilization, Joint manipulation, Spinal manipulation, Spinal mobilization, Scar mobilization, and DME instructions.  PLAN FOR NEXT SESSION: progress with hip labral repair protocol    Prentice RAMAN Jaysean [place], PT, DPT 09/17/2023, 11:00 AM

## 2023-09-17 NOTE — Progress Notes (Signed)
 Post Operative Evaluation    Procedure/Date of Surgery: Left hip arthroscopy with labral repair and cam debridement 6/12  Interval History:    Presents today 2 weeks status post the above procedure.  Overall he is doing very well.  Range of motion and strength are coming along nicely.  He still does use a cane.  He has been out of work.   PMH/PSH/Family History/Social History/Meds/Allergies:    Past Medical History:  Diagnosis Date   Depression    Environmental allergies    Hyperlipidemia    Hypertension    Sarcoidosis of lung (HCC)    Syncope and collapse    Past Surgical History:  Procedure Laterality Date   LUNG BIOPSY Left    SHOULDER SURGERY Right    SHOULDER SURGERY Left 11/27/2021   WISDOM TOOTH EXTRACTION     Social History   Socioeconomic History   Marital status: Married    Spouse name: Not on file   Number of children: 4   Years of education: Not on file   Highest education level: Master's degree (e.g., MA, MS, MEng, MEd, MSW, MBA)  Occupational History   Occupation: Emergency planning/management officer  Tobacco Use   Smoking status: Some Days    Types: Cigars   Smokeless tobacco: Never  Vaping Use   Vaping status: Never Used  Substance and Sexual Activity   Alcohol use: Yes    Comment: occ   Drug use: Not Currently    Types: Marijuana    Comment: in high school   Sexual activity: Yes  Other Topics Concern   Not on file  Social History Narrative   Not on file   Social Drivers of Health   Financial Resource Strain: Low Risk  (04/12/2023)   Overall Financial Resource Strain (CARDIA)    Difficulty of Paying Living Expenses: Not hard at all  Food Insecurity: No Food Insecurity (04/12/2023)   Hunger Vital Sign    Worried About Running Out of Food in the Last Year: Never true    Ran Out of Food in the Last Year: Never true  Transportation Needs: No Transportation Needs (04/12/2023)   PRAPARE - Scientist, research (physical sciences) (Medical): No    Lack of Transportation (Non-Medical): No  Physical Activity: Insufficiently Active (04/12/2023)   Exercise Vital Sign    Days of Exercise per Week: 3 days    Minutes of Exercise per Session: 40 min  Stress: Stress Concern Present (04/12/2023)   [employer] of Occupational Health - Occupational Stress Questionnaire    Feeling of Stress : To some extent  Social Connections: Socially Integrated (04/12/2023)   Social Connection and Isolation Panel    Frequency of Communication with Friends and Family: More than three times a week    Frequency of Social Gatherings with Friends and Family: Never    Attends Religious Services: More than 4 times per year    Active Member of [employer] or Organizations: Yes    Attends Engineer, structural: More than 4 times per year    Marital Status: Married   Family History  Problem Relation Age of Onset   Hypertension Mother    Cardiomyopathy Father    Hypertension Sister    Hypertension Sister    Hypertension Brother    Cardiomyopathy Brother  Hyperlipidemia Maternal Grandmother    Hypertension Maternal Grandmother    Stroke Maternal Grandmother    Stroke Maternal Grandfather    No Known Allergies Current Outpatient Medications  Medication Sig Dispense Refill   albuterol  (VENTOLIN  HFA) 108 (90 Base) MCG/ACT inhaler Inhale 1-2 puffs into the lungs every 6 (six) hours as needed for wheezing or shortness of breath. 8 g 1   amLODipine  (NORVASC ) 10 MG tablet Take 1 tablet (10 mg total) by mouth daily. 90 tablet 2   aspirin  EC 325 MG tablet Take 1 tablet (325 mg total) by mouth daily. 14 tablet 0   celecoxib  (CELEBREX ) 200 MG capsule TAKE 1 CAPSULE BY MOUTH TWICE A DAY 60 capsule 0   docusate sodium  (COLACE) 100 MG capsule Take 1 capsule (100 mg total) by mouth 2 (two) times daily as needed for mild constipation. 100 capsule 0   EPINEPHrine  0.3 mg/0.3 mL IJ SOAJ injection Inject into the muscle.     FLUoxetine  (PROZAC) 20 MG capsule Take 20 mg by mouth every morning.     Multiple Vitamin (MULTI-VITAMIN) tablet Take by mouth.     Omega-3 Fatty Acids (FISH OIL) 300 MG CAPS Take by mouth.     oxyCODONE  (ROXICODONE ) 5 MG immediate release tablet Take 1 tablet (5 mg total) by mouth every 4 (four) hours as needed for severe pain (pain score 7-10) or breakthrough pain. 10 tablet 0   polyethylene glycol powder (GLYCOLAX /MIRALAX ) 17 GM/SCOOP powder Take 17 g by mouth 2 (two) times daily as needed. 3350 g 1   triamterene -hydrochlorothiazide  (DYAZIDE) 37.5-25 MG capsule TAKE 1 EACH (1 CAPSULE TOTAL) BY MOUTH DAILY. 90 capsule 1   ZEPBOUND 2.5 MG/0.5ML Pen Inject 2.5 mg into the skin once a week.     No current facility-administered medications for this visit.   No results found.  Review of Systems:   A ROS was performed including pertinent positives and negatives as documented in the HPI.   Musculoskeletal Exam:    There were no vitals taken for this visit.  Left hip incisions are well-appearing without erythema or drainage.  30 degrees internal rotation external rotation of the hip without pain.  Walks with mildly antalgic gait.  Distal neurosensory exam is intact  Imaging:      I personally reviewed and interpreted the radiographs.   Assessment:   2 weeks status post left hip arthroscopic labral repair and cam debridement doing well.  At this time we will continue to work on strengthening as well as gait normalization.  I will plan to see him back in 4 weeks for reassessment.  He may return to work in 2 weeks  Plan :    - Return for assessment in 4 weeks      I personally saw and evaluated the patient, and participated in the management and treatment plan.  Elspeth Parker, MD Attending Physician, Orthopedic Surgery  This document was dictated using Dragon voice recognition software. A reasonable attempt at proof reading has been made to minimize errors.

## 2023-09-24 ENCOUNTER — Encounter (HOSPITAL_BASED_OUTPATIENT_CLINIC_OR_DEPARTMENT_OTHER): Payer: Self-pay | Admitting: Physical Therapy

## 2023-09-24 ENCOUNTER — Ambulatory Visit (HOSPITAL_BASED_OUTPATIENT_CLINIC_OR_DEPARTMENT_OTHER): Attending: Orthopaedic Surgery | Admitting: Physical Therapy

## 2023-09-24 DIAGNOSIS — R2689 Other abnormalities of gait and mobility: Secondary | ICD-10-CM | POA: Insufficient documentation

## 2023-09-24 DIAGNOSIS — M25552 Pain in left hip: Secondary | ICD-10-CM | POA: Insufficient documentation

## 2023-09-24 DIAGNOSIS — M6281 Muscle weakness (generalized): Secondary | ICD-10-CM | POA: Diagnosis not present

## 2023-09-24 DIAGNOSIS — R29898 Other symptoms and signs involving the musculoskeletal system: Secondary | ICD-10-CM | POA: Diagnosis not present

## 2023-09-24 DIAGNOSIS — M5459 Other low back pain: Secondary | ICD-10-CM | POA: Insufficient documentation

## 2023-09-24 NOTE — Therapy (Signed)
 OUTPATIENT PHYSICAL THERAPY TREATMENT   Patient Name: Dominic Joseph MRN: [id number] DOB:1981/03/09, 43 y.o., male Today's Date: 09/24/2023  END OF SESSION:  PT End of Session - 09/24/23 0936     Visit Number 4    Number of Visits 24    Date for PT Re-Evaluation 11/28/23    Authorization Type [employer]    PT Start Time [phone]    PT Stop Time 1014    PT Time Calculation (min) 40 min    Activity Tolerance Patient tolerated treatment well    Behavior During Therapy [facility] for tasks assessed/performed          Past Medical History:  Diagnosis Date   Depression    Environmental allergies    Hyperlipidemia    Hypertension    Sarcoidosis of lung (HCC)    Syncope and collapse    Past Surgical History:  Procedure Laterality Date   LUNG BIOPSY Left    SHOULDER SURGERY Right    SHOULDER SURGERY Left 11/27/2021   WISDOM TOOTH EXTRACTION     Patient Active Problem List   Diagnosis Date Noted   Neutropenia (HCC) 08/15/2022   Family history of prostate cancer 06/09/2022   Healthcare maintenance 06/09/2022   Visit for suture removal 05/20/2022   Slow transit constipation 05/20/2022   Benign prostatic hyperplasia with urinary hesitancy 05/20/2022   Syncope and collapse    Cluster headaches 07/07/2019   Essential hypertension 07/07/2019   Fatigue 07/07/2019   ADD (attention deficit disorder) 07/07/2019   Severe depression (HCC) 07/07/2019   IBS (irritable colon syndrome) 07/07/2019   Elevated serum creatinine 07/07/2019   Chronic pansinusitis 11/30/2018   Perennial allergic rhinitis 11/30/2018   Tobacco use disorder 01/29/2012   Environmental allergies 01/28/2012   Plantar fascial fibromatosis 12/23/2011   Asthma 07/25/2011   Sarcoidosis 07/25/2011   Pain in joint involving lower leg 06/10/2011   Primary localized osteoarthrosis, lower leg 06/10/2011    PCP: Berneta Elsie Sayre, MD  REFERRING PROVIDER: Genelle Elspeth, MD  REFERRING DIAG: [phone] (ICD-10-CM) - Tear of  left acetabular labrum, initial encounter; L hip arthroscopy with labral repair DOS 09/03/23  THERAPY DIAG:  Pain in left hip  Muscle weakness (generalized)  Other abnormalities of gait and mobility  Other symptoms and signs involving the musculoskeletal system  Rationale for Evaluation and Treatment: Rehabilitation  ONSET DATE:  DOS 09/03/23  SUBJECTIVE:   SUBJECTIVE STATEMENT: Patient states  strength is coming back. Walking is getting better.   EVAL: Patient states he has not been putting weight on his leg yet with walking. Sleeping in recliner is more comfortable than bed. Using crutches. Normally active: weightlifting, riding bike, etc.   PERTINENT HISTORY: HTN, HLD, sarcoidosis of lung, hx LBP PAIN:  Are you having pain? Yes: NPRS scale: 1-2/10 Pain location: L hip Pain description: all of the above Aggravating factors: sleeping in bed Relieving factors: rest  PRECAUTIONS: Other: L hip arthroscopy with labral repair   WEIGHT BEARING RESTRICTIONS: WBAT  FALLS:  Has patient fallen in last 6 months? No  OCCUPATION: [employer]  PLOF: Independent  PATIENT GOALS: strengthening, decrease pain, rehab from surgery  OBJECTIVE: (objective measures from initial evaluation unless otherwise dated)  Observation: sutures intact, No s/s of infection  PATIENT SURVEYS:  LEFS  Extreme difficulty/unable (0), Quite a bit of difficulty (1), Moderate difficulty (2), Little difficulty (3), No difficulty (4) Survey date:  09/05/23  Any of your usual work, housework or school activities 0  2. Usual hobbies,  recreational or sporting activities 0  3. Getting into/out of the bath 0  4. Walking between rooms 1  5. Putting on socks/shoes 1  6. Squatting  1  7. Lifting an object, like a bag of groceries from the floor 1  8. Performing light activities around your home 2  9. Performing heavy activities around your home 0  10. Getting into/out of a car 1  11. Walking 2 blocks 1  12.  Walking 1 mile 0  13. Going up/down 10 stairs (1 flight) 1  14. Standing for 1 hour 1  15.  sitting for 1 hour 1  16. Running on even ground 0  17. Running on uneven ground 0  18. Making sharp turns while running fast 0  19. Hopping  0  20. Rolling over in bed 1  Score total:  12/80     COGNITION: Overall cognitive status: Within functional limits for tasks assessed     SENSATION: WFL  POSTURE: No Significant postural limitations  PALPATION: Grossly Tender L hip  LOWER EXTREMITY ROM:  Passive ROM Right eval Left eval  Hip flexion  90  Hip extension    Hip abduction  30  Hip adduction    Hip internal rotation  20  Hip external rotation  20  Knee flexion    Knee extension    Ankle dorsiflexion    Ankle plantarflexion    Ankle inversion    Ankle eversion     (Blank rows = not tested) *= pain/symptoms  LOWER EXTREMITY MMT:  MMT Right eval Left eval  Hip flexion    Hip extension    Hip abduction    Hip adduction    Hip internal rotation    Hip external rotation    Knee flexion 5 4- *  Knee extension 5 4  Ankle dorsiflexion    Ankle plantarflexion    Ankle inversion    Ankle eversion     (Blank rows = not tested) *= pain/symptoms   FUNCTIONAL TESTS:  NT due to post op status  GAIT: Distance walked: 100 feet Assistive device utilized: Crutches Level of assistance: Modified independence Comments: NWB L hip; able to ambulate with bilateral axillary crutches following cueing/education   TODAY'S TREATMENT:                                                                                                                              DATE:  09/24/23 Upright bike 5 minutes  Bridge 10# 2 x 10, GTB at knees with clam 3 x 10 Sidelying clam GTB 3 x 10 Prone hip extension 3# 3 x 10 Tall kneeling hip hinge 10# 3 x 10 Tall kneeling with shoulder extension GTB 3 x 10 1/2 kneeling lift 10# 3 x 10 STS from elevated plinth 2 x 10   09/17/23 Bridge 3 x  10 Sidelying reverse clam 2 x 10 Prone hamstring curl 5# 3 x 10 Prone hip extension  3 x 10 Prone hip ER/IR small range 2 x 10 Quadruped rocking 2 x 10 Quadruped pelvic tilts 2 x 10 Quadruped shoulder flexion 2 x 10 Tall kneeling hip hinge 2 x 10 Gait mechanics with glute set with SPC Manual: STM to hip flexors/adductors  09/10/23 Gait mechanics with glute set with SPC Bent knee fall outs 2 x 10 Sidelying clam 3 x 10 Prone hamstring curl 3# 2 x 10 Prone hip ER/IR small range 2 x 10  Quadruped rocking 2 x 10 Quadruped pelvic tilts 2 x 10 Dressing change   09/05/23 Eval, HEP, Education, dressing change, gait mechanics   PATIENT EDUCATION:  Education details: Patient educated on exam findings, POC, scope of PT, HEP, protocol/precaution, gait mechanics, and dressing change. 09/10/23: HEP, proper activity levels/progressions Person educated: Patient Education method: Explanation, Demonstration, and Handouts Education comprehension: verbalized understanding, returned demonstration, verbal cues required, and tactile cues required  HOME EXERCISE PROGRAM: Access Code: [email or web address] URL: [email or web address] Date: 09/05/2023 Prepared by: Prentice Jibri Schriefer  Exercises - Gluteal Sets (Mirrored)  - 3 x daily - 7 x weekly - 2 sets - 10 reps - 5-10 second hold - Supine Quadricep Sets (Mirrored)  - 3 x daily - 7 x weekly - 2 sets - 10 reps - 5-10 second hold - Clamshell (Mirrored)  - 3 x daily - 7 x weekly - 2 sets - 10 reps - Bent Knee Fallouts (Mirrored)  - 3 x daily - 7 x weekly - 2 sets - 10 reps  ASSESSMENT:  CLINICAL IMPRESSION: Continued to progress with glute strength which is tolerated well. Began additional postural strengthening with with emphasis on glute activation. Patient will continue to benefit from physical therapy in order to improve function and reduce impairment.   OBJECTIVE IMPAIRMENTS: Abnormal gait, decreased activity tolerance, decreased balance,  decreased endurance, decreased mobility, difficulty walking, decreased ROM, decreased strength, increased muscle spasms, impaired flexibility, improper body mechanics, and pain.   ACTIVITY LIMITATIONS: carrying, lifting, bending, standing, squatting, sleeping, stairs, transfers, bed mobility, bathing, toileting, dressing, hygiene/grooming, locomotion level, and caring for others  PARTICIPATION LIMITATIONS: meal prep, cleaning, laundry, driving, shopping, community activity, occupation, and yard work  PERSONAL FACTORS: Time since onset of injury/illness/exacerbation and 3+ comorbidities: HTN, HLD, sarcoidosis of lung are also affecting patient's functional outcome.   REHAB POTENTIAL: Good  CLINICAL DECISION MAKING: Stable/uncomplicated  EVALUATION COMPLEXITY: Low   GOALS: Goals reviewed with patient? Yes  SHORT TERM GOALS: Target date: 10/03/2023    Patient will be independent with HEP in order to improve functional outcomes. Baseline: Goal status: INITIAL  2.  Patient will report at least 25% improvement in symptoms for improved quality of life. Baseline: Goal status: INITIAL  3.  SLS 30s without compensation Baseline:  Goal status: INITIAL      LONG TERM GOALS: Target date: 11/28/2023    Patient will report at least 75% improvement in symptoms for improved quality of life. Baseline:  Goal status: INITIAL  2.  Patient will improve LEFS score by at least 48 points in order to indicate improved tolerance to activity. Baseline: 12/80 Goal status: INITIAL  3.  Patient will be able to navigate stairs with reciprocal pattern without compensation in order to demonstrate improved LE strength. Baseline:  Goal status: INITIAL  4.  Patient will demonstrate MMT 90% of opposite lower extremity in all tested musculature as evidence of improved strength to assist with stair ambulation and gait. Baseline:  Goal status: INITIAL  5.  Patient will be able  to return to all activities  unrestricted for improved ability to perform work functions and participate with family.  Baseline:  Goal status: INITIAL    PLAN:  PT FREQUENCY: 1-2x/week  PT DURATION: 12 weeks  PLANNED INTERVENTIONS: [id number]- PT Re-evaluation, 97110-Therapeutic exercises, 97530- Therapeutic activity, [id number]- Neuromuscular re-education, 97535- Self Care, [id number]- Manual therapy, [id number]- Gait training, [phone]- Orthotic Fit/training, [phone]- Canalith repositioning, [id number]- Aquatic Therapy, [phone]- Splinting, [phone]- Wound care (first 20 sq cm), 97598- Wound care (each additional 20 sq cm)Patient/Family education, Balance training, Stair training, Taping, Dry Needling, Joint mobilization, Joint manipulation, Spinal manipulation, Spinal mobilization, Scar mobilization, and DME instructions.  PLAN FOR NEXT SESSION: progress with hip labral repair protocol    Prentice RAMAN Donell Sliwinski, PT, DPT 09/24/2023, 10:20 AM

## 2023-09-27 DIAGNOSIS — E669 Obesity, unspecified: Secondary | ICD-10-CM | POA: Diagnosis not present

## 2023-09-29 ENCOUNTER — Encounter (HOSPITAL_BASED_OUTPATIENT_CLINIC_OR_DEPARTMENT_OTHER): Payer: Self-pay | Admitting: Physical Therapy

## 2023-09-29 ENCOUNTER — Ambulatory Visit (HOSPITAL_BASED_OUTPATIENT_CLINIC_OR_DEPARTMENT_OTHER): Admitting: Physical Therapy

## 2023-09-29 DIAGNOSIS — R29898 Other symptoms and signs involving the musculoskeletal system: Secondary | ICD-10-CM | POA: Diagnosis not present

## 2023-09-29 DIAGNOSIS — M6281 Muscle weakness (generalized): Secondary | ICD-10-CM | POA: Diagnosis not present

## 2023-09-29 DIAGNOSIS — M25552 Pain in left hip: Secondary | ICD-10-CM

## 2023-09-29 DIAGNOSIS — R2689 Other abnormalities of gait and mobility: Secondary | ICD-10-CM | POA: Diagnosis not present

## 2023-09-29 DIAGNOSIS — M5459 Other low back pain: Secondary | ICD-10-CM | POA: Diagnosis not present

## 2023-09-29 NOTE — Therapy (Signed)
 OUTPATIENT PHYSICAL THERAPY TREATMENT   Patient Name: DEMONE Joseph MRN: [id number] DOB:1980-06-04, 43 y.o., male Today's Date: 09/29/2023  END OF SESSION:  PT End of Session - 09/29/23 1524     Visit Number 5    Number of Visits 24    Date for PT Re-Evaluation 11/28/23    Authorization Type [employer]    PT Start Time 1521    PT Stop Time 1608    PT Time Calculation (min) 47 min    Activity Tolerance Patient tolerated treatment well    Behavior During Therapy WFL for tasks assessed/performed          Past Medical History:  Diagnosis Date   Depression    Environmental allergies    Hyperlipidemia    Hypertension    Sarcoidosis of lung (HCC)    Syncope and collapse    Past Surgical History:  Procedure Laterality Date   LUNG BIOPSY Left    SHOULDER SURGERY Right    SHOULDER SURGERY Left 11/27/2021   WISDOM TOOTH EXTRACTION     Patient Active Problem List   Diagnosis Date Noted   Neutropenia (HCC) 08/15/2022   Family history of prostate cancer 06/09/2022   Healthcare maintenance 06/09/2022   Visit for suture removal 05/20/2022   Slow transit constipation 05/20/2022   Benign prostatic hyperplasia with urinary hesitancy 05/20/2022   Syncope and collapse    Cluster headaches 07/07/2019   Essential hypertension 07/07/2019   Fatigue 07/07/2019   ADD (attention deficit disorder) 07/07/2019   Severe depression (HCC) 07/07/2019   IBS (irritable colon syndrome) 07/07/2019   Elevated serum creatinine 07/07/2019   Chronic pansinusitis 11/30/2018   Perennial allergic rhinitis 11/30/2018   Tobacco use disorder 01/29/2012   Environmental allergies 01/28/2012   Plantar fascial fibromatosis 12/23/2011   Asthma 07/25/2011   Sarcoidosis 07/25/2011   Pain in joint involving lower leg 06/10/2011   Primary localized osteoarthrosis, lower leg 06/10/2011    PCP: Berneta Elsie Sayre, MD  REFERRING PROVIDER: Genelle Elspeth, MD  REFERRING DIAG: [phone] (ICD-10-CM) - Tear of  left acetabular labrum, initial encounter; L hip arthroscopy with labral repair DOS 09/03/23  THERAPY DIAG:  Pain in left hip  Muscle weakness (generalized)  Other abnormalities of gait and mobility  Other symptoms and signs involving the musculoskeletal system  Rationale for Evaluation and Treatment: Rehabilitation  ONSET DATE:  DOS 09/03/23  SUBJECTIVE:   SUBJECTIVE STATEMENT: Patient states  sore/stiff following last session. Difficulty getting comfortable with sleeping.   EVAL: Patient states he has not been putting weight on his leg yet with walking. Sleeping in recliner is more comfortable than bed. Using crutches. Normally active: weightlifting, riding bike, etc.   PERTINENT HISTORY: HTN, HLD, sarcoidosis of lung, hx LBP PAIN:  Are you having pain? Yes: NPRS scale: 0/10 Pain location: L hip Pain description: all of the above Aggravating factors: sleeping in bed Relieving factors: rest  PRECAUTIONS: Other: L hip arthroscopy with labral repair   WEIGHT BEARING RESTRICTIONS: WBAT  FALLS:  Has patient fallen in last 6 months? No  OCCUPATION: [employer]  PLOF: Independent  PATIENT GOALS: strengthening, decrease pain, rehab from surgery  OBJECTIVE: (objective measures from initial evaluation unless otherwise dated)  Observation: sutures intact, No s/s of infection  PATIENT SURVEYS:  LEFS  Extreme difficulty/unable (0), Quite a bit of difficulty (1), Moderate difficulty (2), Little difficulty (3), No difficulty (4) Survey date:  09/05/23  Any of your usual work, housework or school activities 0  2. Usual  hobbies, recreational or sporting activities 0  3. Getting into/out of the bath 0  4. Walking between rooms 1  5. Putting on socks/shoes 1  6. Squatting  1  7. Lifting an object, like a bag of groceries from the floor 1  8. Performing light activities around your home 2  9. Performing heavy activities around your home 0  10. Getting into/out of a car 1   11. Walking 2 blocks 1  12. Walking 1 mile 0  13. Going up/down 10 stairs (1 flight) 1  14. Standing for 1 hour 1  15.  sitting for 1 hour 1  16. Running on even ground 0  17. Running on uneven ground 0  18. Making sharp turns while running fast 0  19. Hopping  0  20. Rolling over in bed 1  Score total:  12/80     COGNITION: Overall cognitive status: Within functional limits for tasks assessed     SENSATION: WFL  POSTURE: No Significant postural limitations  PALPATION: Grossly Tender L hip  LOWER EXTREMITY ROM:  Passive ROM Right eval Left eval  Hip flexion  90  Hip extension    Hip abduction  30  Hip adduction    Hip internal rotation  20  Hip external rotation  20  Knee flexion    Knee extension    Ankle dorsiflexion    Ankle plantarflexion    Ankle inversion    Ankle eversion     (Blank rows = not tested) *= pain/symptoms  LOWER EXTREMITY MMT:  MMT Right eval Left eval  Hip flexion    Hip extension    Hip abduction    Hip adduction    Hip internal rotation    Hip external rotation    Knee flexion 5 4- *  Knee extension 5 4  Ankle dorsiflexion    Ankle plantarflexion    Ankle inversion    Ankle eversion     (Blank rows = not tested) *= pain/symptoms   FUNCTIONAL TESTS:  NT due to post op status  GAIT: Distance walked: 100 feet Assistive device utilized: Crutches Level of assistance: Modified independence Comments: NWB L hip; able to ambulate with bilateral axillary crutches following cueing/education   TODAY'S TREATMENT:                                                                                                                              DATE:  09/29/23 Upright bike 5 minutes Tall kneeling with shoulder extension BTB 3 x 10 Tall kneeling hip hinge 20# 3 x 10 1/2 kneeling lift 20# 3 x 10 Prone hip extension 3# 3 x 10 Hip circles CW/CCW 2 x 10 STS 1 x 10, with airex 2 x 10  Hip hinge 1  x 10   09/24/23 Upright bike 5 minutes   Bridge 10# 2 x 10, GTB at knees with clam 3 x 10 Sidelying clam GTB 3 x 10  Prone hip extension 3# 3 x 10 Tall kneeling hip hinge 10# 3 x 10 Tall kneeling with shoulder extension GTB 3 x 10 1/2 kneeling lift 10# 3 x 10 STS from elevated plinth 2 x 10   09/17/23 Bridge 3 x 10 Sidelying reverse clam 2 x 10 Prone hamstring curl 5# 3 x 10 Prone hip extension 3 x 10 Prone hip ER/IR small range 2 x 10 Quadruped rocking 2 x 10 Quadruped pelvic tilts 2 x 10 Quadruped shoulder flexion 2 x 10 Tall kneeling hip hinge 2 x 10 Gait mechanics with glute set with SPC Manual: STM to hip flexors/adductors  09/10/23 Gait mechanics with glute set with SPC Bent knee fall outs 2 x 10 Sidelying clam 3 x 10 Prone hamstring curl 3# 2 x 10 Prone hip ER/IR small range 2 x 10  Quadruped rocking 2 x 10 Quadruped pelvic tilts 2 x 10 Dressing change   09/05/23 Eval, HEP, Education, dressing change, gait mechanics   PATIENT EDUCATION:  Education details: Patient educated on exam findings, POC, scope of PT, HEP, protocol/precaution, gait mechanics, and dressing change. 09/10/23: HEP, proper activity levels/progressions Person educated: Patient Education method: Explanation, Demonstration, and Handouts Education comprehension: verbalized understanding, returned demonstration, verbal cues required, and tactile cues required  HOME EXERCISE PROGRAM: Access Code: [email or web address] URL: [email or web address] Date: 09/05/2023 Prepared by: Prentice Johnel Yielding  Exercises - Gluteal Sets (Mirrored)  - 3 x daily - 7 x weekly - 2 sets - 10 reps - 5-10 second hold - Supine Quadricep Sets (Mirrored)  - 3 x daily - 7 x weekly - 2 sets - 10 reps - 5-10 second hold - Clamshell (Mirrored)  - 3 x daily - 7 x weekly - 2 sets - 10 reps - Bent Knee Fallouts (Mirrored)  - 3 x daily - 7 x weekly - 2 sets - 10 reps  ASSESSMENT:  CLINICAL IMPRESSION: Continued to progress with glute strength which is tolerated well.  Moderate fatigue at EOS.  Patient will continue to benefit from physical therapy in order to improve function and reduce impairment.   OBJECTIVE IMPAIRMENTS: Abnormal gait, decreased activity tolerance, decreased balance, decreased endurance, decreased mobility, difficulty walking, decreased ROM, decreased strength, increased muscle spasms, impaired flexibility, improper body mechanics, and pain.   ACTIVITY LIMITATIONS: carrying, lifting, bending, standing, squatting, sleeping, stairs, transfers, bed mobility, bathing, toileting, dressing, hygiene/grooming, locomotion level, and caring for others  PARTICIPATION LIMITATIONS: meal prep, cleaning, laundry, driving, shopping, community activity, occupation, and yard work  PERSONAL FACTORS: Time since onset of injury/illness/exacerbation and 3+ comorbidities: HTN, HLD, sarcoidosis of lung are also affecting patient's functional outcome.   REHAB POTENTIAL: Good  CLINICAL DECISION MAKING: Stable/uncomplicated  EVALUATION COMPLEXITY: Low   GOALS: Goals reviewed with patient? Yes  SHORT TERM GOALS: Target date: 10/03/2023    Patient will be independent with HEP in order to improve functional outcomes. Baseline: Goal status: INITIAL  2.  Patient will report at least 25% improvement in symptoms for improved quality of life. Baseline: Goal status: INITIAL  3.  SLS 30s without compensation Baseline:  Goal status: INITIAL      LONG TERM GOALS: Target date: 11/28/2023    Patient will report at least 75% improvement in symptoms for improved quality of life. Baseline:  Goal status: INITIAL  2.  Patient will improve LEFS score by at least 48 points in order to indicate improved tolerance to activity. Baseline: 12/80 Goal status: INITIAL  3.  Patient will be able to navigate stairs with  reciprocal pattern without compensation in order to demonstrate improved LE strength. Baseline:  Goal status: INITIAL  4.  Patient will demonstrate  MMT 90% of opposite lower extremity in all tested musculature as evidence of improved strength to assist with stair ambulation and gait. Baseline:  Goal status: INITIAL  5.  Patient will be able to return to all activities unrestricted for improved ability to perform work functions and participate with family.  Baseline:  Goal status: INITIAL    PLAN:  PT FREQUENCY: 1-2x/week  PT DURATION: 12 weeks  PLANNED INTERVENTIONS: 97164- PT Re-evaluation, 97110-Therapeutic exercises, 97530- Therapeutic activity, [id number]- Neuromuscular re-education, 97535- Self Care, [id number]- Manual therapy, [id number]- Gait training, [phone]- Orthotic Fit/training, [phone]- Canalith repositioning, [id number]- Aquatic Therapy, [phone]- Splinting, [phone]- Wound care (first 20 sq cm), 97598- Wound care (each additional 20 sq cm)Patient/Family education, Balance training, Stair training, Taping, Dry Needling, Joint mobilization, Joint manipulation, Spinal manipulation, Spinal mobilization, Scar mobilization, and DME instructions.  PLAN FOR NEXT SESSION: progress with hip labral repair protocol    Prentice GORMAN Stains, PT, DPT 09/29/2023, 4:15 PM

## 2023-10-01 ENCOUNTER — Encounter (HOSPITAL_BASED_OUTPATIENT_CLINIC_OR_DEPARTMENT_OTHER): Admitting: Physical Therapy

## 2023-10-01 ENCOUNTER — Encounter (HOSPITAL_BASED_OUTPATIENT_CLINIC_OR_DEPARTMENT_OTHER): Payer: Self-pay

## 2023-10-06 ENCOUNTER — Encounter (HOSPITAL_BASED_OUTPATIENT_CLINIC_OR_DEPARTMENT_OTHER): Payer: Self-pay | Admitting: Physical Therapy

## 2023-10-06 ENCOUNTER — Ambulatory Visit (HOSPITAL_BASED_OUTPATIENT_CLINIC_OR_DEPARTMENT_OTHER): Admitting: Physical Therapy

## 2023-10-06 DIAGNOSIS — R2689 Other abnormalities of gait and mobility: Secondary | ICD-10-CM

## 2023-10-06 DIAGNOSIS — R29898 Other symptoms and signs involving the musculoskeletal system: Secondary | ICD-10-CM | POA: Diagnosis not present

## 2023-10-06 DIAGNOSIS — M25552 Pain in left hip: Secondary | ICD-10-CM | POA: Diagnosis not present

## 2023-10-06 DIAGNOSIS — M5459 Other low back pain: Secondary | ICD-10-CM | POA: Diagnosis not present

## 2023-10-06 DIAGNOSIS — M6281 Muscle weakness (generalized): Secondary | ICD-10-CM

## 2023-10-06 NOTE — Therapy (Signed)
 OUTPATIENT PHYSICAL THERAPY TREATMENT   Patient Name: Dominic Joseph MRN: [id number] DOB:Jun 30, 1980, 43 y.o., male Today's Date: 10/06/2023  END OF SESSION:  PT End of Session - 10/06/23 1518     Visit Number 6    Number of Visits 24    Date for PT Re-Evaluation 11/28/23    Authorization Type [employer]    PT Start Time 1516    PT Stop Time 1556    PT Time Calculation (min) 40 min    Activity Tolerance Patient tolerated treatment well    Behavior During Therapy WFL for tasks assessed/performed          Past Medical History:  Diagnosis Date   Depression    Environmental allergies    Hyperlipidemia    Hypertension    Sarcoidosis of lung (HCC)    Syncope and collapse    Past Surgical History:  Procedure Laterality Date   LUNG BIOPSY Left    SHOULDER SURGERY Right    SHOULDER SURGERY Left 11/27/2021   WISDOM TOOTH EXTRACTION     Patient Active Problem List   Diagnosis Date Noted   Neutropenia (HCC) 08/15/2022   Family history of prostate cancer 06/09/2022   Healthcare maintenance 06/09/2022   Visit for suture removal 05/20/2022   Slow transit constipation 05/20/2022   Benign prostatic hyperplasia with urinary hesitancy 05/20/2022   Syncope and collapse    Cluster headaches 07/07/2019   Essential hypertension 07/07/2019   Fatigue 07/07/2019   ADD (attention deficit disorder) 07/07/2019   Severe depression (HCC) 07/07/2019   IBS (irritable colon syndrome) 07/07/2019   Elevated serum creatinine 07/07/2019   Chronic pansinusitis 11/30/2018   Perennial allergic rhinitis 11/30/2018   Tobacco use disorder 01/29/2012   Environmental allergies 01/28/2012   Plantar fascial fibromatosis 12/23/2011   Asthma 07/25/2011   Sarcoidosis 07/25/2011   Pain in joint involving lower leg 06/10/2011   Primary localized osteoarthrosis, lower leg 06/10/2011    PCP: Berneta Elsie Sayre, MD  REFERRING PROVIDER: Genelle Elspeth, MD  REFERRING DIAG: [phone] (ICD-10-CM) - Tear of  left acetabular labrum, initial encounter; L hip arthroscopy with labral repair DOS 09/03/23  THERAPY DIAG:  Pain in left hip  Muscle weakness (generalized)  Other abnormalities of gait and mobility  Other symptoms and signs involving the musculoskeletal system  Rationale for Evaluation and Treatment: Rehabilitation  ONSET DATE:  DOS 09/03/23  SUBJECTIVE:   SUBJECTIVE STATEMENT: Patient states doing HEP. Some soreness in lateral hip.  EVAL: Patient states he has not been putting weight on his leg yet with walking. Sleeping in recliner is more comfortable than bed. Using crutches. Normally active: weightlifting, riding bike, etc.   PERTINENT HISTORY: HTN, HLD, sarcoidosis of lung, hx LBP PAIN:  Are you having pain? Yes: NPRS scale: 0/10 Pain location: L hip Pain description: all of the above Aggravating factors: sleeping in bed Relieving factors: rest  PRECAUTIONS: Other: L hip arthroscopy with labral repair   WEIGHT BEARING RESTRICTIONS: WBAT  FALLS:  Has patient fallen in last 6 months? No  OCCUPATION: [employer]  PLOF: Independent  PATIENT GOALS: strengthening, decrease pain, rehab from surgery  OBJECTIVE: (objective measures from initial evaluation unless otherwise dated)  Observation: sutures intact, No s/s of infection  PATIENT SURVEYS:  LEFS  Extreme difficulty/unable (0), Quite a bit of difficulty (1), Moderate difficulty (2), Little difficulty (3), No difficulty (4) Survey date:  09/05/23  Any of your usual work, housework or school activities 0  2. Usual hobbies, recreational or sporting  activities 0  3. Getting into/out of the bath 0  4. Walking between rooms 1  5. Putting on socks/shoes 1  6. Squatting  1  7. Lifting an object, like a bag of groceries from the floor 1  8. Performing light activities around your home 2  9. Performing heavy activities around your home 0  10. Getting into/out of a car 1  11. Walking 2 blocks 1  12. Walking 1 mile  0  13. Going up/down 10 stairs (1 flight) 1  14. Standing for 1 hour 1  15.  sitting for 1 hour 1  16. Running on even ground 0  17. Running on uneven ground 0  18. Making sharp turns while running fast 0  19. Hopping  0  20. Rolling over in bed 1  Score total:  12/80     COGNITION: Overall cognitive status: Within functional limits for tasks assessed     SENSATION: WFL  POSTURE: No Significant postural limitations  PALPATION: Grossly Tender L hip  LOWER EXTREMITY ROM:  Passive ROM Right eval Left eval  Hip flexion  90  Hip extension    Hip abduction  30  Hip adduction    Hip internal rotation  20  Hip external rotation  20  Knee flexion    Knee extension    Ankle dorsiflexion    Ankle plantarflexion    Ankle inversion    Ankle eversion     (Blank rows = not tested) *= pain/symptoms  LOWER EXTREMITY MMT:  MMT Right eval Left eval  Hip flexion    Hip extension    Hip abduction    Hip adduction    Hip internal rotation    Hip external rotation    Knee flexion 5 4- *  Knee extension 5 4  Ankle dorsiflexion    Ankle plantarflexion    Ankle inversion    Ankle eversion     (Blank rows = not tested) *= pain/symptoms   FUNCTIONAL TESTS:  NT due to post op status  GAIT: Distance walked: 100 feet Assistive device utilized: Crutches Level of assistance: Modified independence Comments: NWB L hip; able to ambulate with bilateral axillary crutches following cueing/education   TODAY'S TREATMENT:                                                                                                                              DATE:  10/06/23 Manual: STM to glutes Prone hip extension 3# 3 x 10 Step up 4 inch 3 x 10 SLS 4 x 30 second holds with intermittent UE support Hip hinge 2  x 10  Plank on knees 3 x 20-30 second holds Gait mechanics  09/29/23 Upright bike 5 minutes Tall kneeling with shoulder extension BTB 3 x 10 Tall kneeling hip hinge 20# 3 x 10 1/2  kneeling lift 20# 3 x 10 Prone hip extension 3# 3 x 10 Hip circles CW/CCW 2 x 10  STS 1 x 10, with airex 2 x 10  Hip hinge 1  x 10   09/24/23 Upright bike 5 minutes  Bridge 10# 2 x 10, GTB at knees with clam 3 x 10 Sidelying clam GTB 3 x 10 Prone hip extension 3# 3 x 10 Tall kneeling hip hinge 10# 3 x 10 Tall kneeling with shoulder extension GTB 3 x 10 1/2 kneeling lift 10# 3 x 10 STS from elevated plinth 2 x 10   09/17/23 Bridge 3 x 10 Sidelying reverse clam 2 x 10 Prone hamstring curl 5# 3 x 10 Prone hip extension 3 x 10 Prone hip ER/IR small range 2 x 10 Quadruped rocking 2 x 10 Quadruped pelvic tilts 2 x 10 Quadruped shoulder flexion 2 x 10 Tall kneeling hip hinge 2 x 10 Gait mechanics with glute set with SPC Manual: STM to hip flexors/adductors  09/10/23 Gait mechanics with glute set with SPC Bent knee fall outs 2 x 10 Sidelying clam 3 x 10 Prone hamstring curl 3# 2 x 10 Prone hip ER/IR small range 2 x 10  Quadruped rocking 2 x 10 Quadruped pelvic tilts 2 x 10 Dressing change   09/05/23 Eval, HEP, Education, dressing change, gait mechanics   PATIENT EDUCATION:  Education details: Patient educated on exam findings, POC, scope of PT, HEP, protocol/precaution, gait mechanics, and dressing change. 09/10/23: HEP, proper activity levels/progressions Person educated: Patient Education method: Explanation, Demonstration, and Handouts Education comprehension: verbalized understanding, returned demonstration, verbal cues required, and tactile cues required  HOME EXERCISE PROGRAM: Access Code: [email or web address] URL: [email or web address] Date: 09/05/2023 Prepared by: Prentice Kalah Pflum  Exercises - Gluteal Sets (Mirrored)  - 3 x daily - 7 x weekly - 2 sets - 10 reps - 5-10 second hold - Supine Quadricep Sets (Mirrored)  - 3 x daily - 7 x weekly - 2 sets - 10 reps - 5-10 second hold - Clamshell (Mirrored)  - 3 x daily - 7 x weekly - 2 sets - 10 reps - Bent Knee  Fallouts (Mirrored)  - 3 x daily - 7 x weekly - 2 sets - 10 reps  ASSESSMENT:  CLINICAL IMPRESSION: Hyperactive and tender L glute med/min, STM with decrease in tissue tension. Slight compensated trendelenburg on L stance. Progressed strength and stability exercises as able.  Patient will continue to benefit from physical therapy in order to improve function and reduce impairment.   OBJECTIVE IMPAIRMENTS: Abnormal gait, decreased activity tolerance, decreased balance, decreased endurance, decreased mobility, difficulty walking, decreased ROM, decreased strength, increased muscle spasms, impaired flexibility, improper body mechanics, and pain.   ACTIVITY LIMITATIONS: carrying, lifting, bending, standing, squatting, sleeping, stairs, transfers, bed mobility, bathing, toileting, dressing, hygiene/grooming, locomotion level, and caring for others  PARTICIPATION LIMITATIONS: meal prep, cleaning, laundry, driving, shopping, community activity, occupation, and yard work  PERSONAL FACTORS: Time since onset of injury/illness/exacerbation and 3+ comorbidities: HTN, HLD, sarcoidosis of lung are also affecting patient's functional outcome.   REHAB POTENTIAL: Good  CLINICAL DECISION MAKING: Stable/uncomplicated  EVALUATION COMPLEXITY: Low   GOALS: Goals reviewed with patient? Yes  SHORT TERM GOALS: Target date: 10/03/2023    Patient will be independent with HEP in order to improve functional outcomes. Baseline: Goal status: INITIAL  2.  Patient will report at least 25% improvement in symptoms for improved quality of life. Baseline: Goal status: INITIAL  3.  SLS 30s without compensation Baseline:  Goal status: INITIAL      LONG TERM GOALS: Target date: 11/28/2023    Patient will  report at least 75% improvement in symptoms for improved quality of life. Baseline:  Goal status: INITIAL  2.  Patient will improve LEFS score by at least 48 points in order to indicate improved tolerance  to activity. Baseline: 12/80 Goal status: INITIAL  3.  Patient will be able to navigate stairs with reciprocal pattern without compensation in order to demonstrate improved LE strength. Baseline:  Goal status: INITIAL  4.  Patient will demonstrate MMT 90% of opposite lower extremity in all tested musculature as evidence of improved strength to assist with stair ambulation and gait. Baseline:  Goal status: INITIAL  5.  Patient will be able to return to all activities unrestricted for improved ability to perform work functions and participate with family.  Baseline:  Goal status: INITIAL    PLAN:  PT FREQUENCY: 1-2x/week  PT DURATION: 12 weeks  PLANNED INTERVENTIONS: 97164- PT Re-evaluation, 97110-Therapeutic exercises, 97530- Therapeutic activity, [id number]- Neuromuscular re-education, 97535- Self Care, 02859- Manual therapy, [id number]- Gait training, [phone]- Orthotic Fit/training, [phone]- Canalith repositioning, [id number]- Aquatic Therapy, [phone]- Splinting, [phone]- Wound care (first 20 sq cm), 97598- Wound care (each additional 20 sq cm)Patient/Family education, Balance training, Stair training, Taping, Dry Needling, Joint mobilization, Joint manipulation, Spinal manipulation, Spinal mobilization, Scar mobilization, and DME instructions.  PLAN FOR NEXT SESSION: progress with hip labral repair protocol    Prentice GORMAN Stains, PT, DPT 10/06/2023, 3:19 PM

## 2023-10-08 ENCOUNTER — Encounter (HOSPITAL_BASED_OUTPATIENT_CLINIC_OR_DEPARTMENT_OTHER): Payer: Self-pay | Admitting: Physical Therapy

## 2023-10-08 ENCOUNTER — Ambulatory Visit (HOSPITAL_BASED_OUTPATIENT_CLINIC_OR_DEPARTMENT_OTHER): Admitting: Physical Therapy

## 2023-10-08 DIAGNOSIS — M25552 Pain in left hip: Secondary | ICD-10-CM | POA: Diagnosis not present

## 2023-10-08 DIAGNOSIS — R2689 Other abnormalities of gait and mobility: Secondary | ICD-10-CM | POA: Diagnosis not present

## 2023-10-08 DIAGNOSIS — R29898 Other symptoms and signs involving the musculoskeletal system: Secondary | ICD-10-CM | POA: Diagnosis not present

## 2023-10-08 DIAGNOSIS — M5459 Other low back pain: Secondary | ICD-10-CM | POA: Diagnosis not present

## 2023-10-08 DIAGNOSIS — M6281 Muscle weakness (generalized): Secondary | ICD-10-CM | POA: Diagnosis not present

## 2023-10-08 NOTE — Therapy (Signed)
 OUTPATIENT PHYSICAL THERAPY TREATMENT   Patient Name: Dominic Joseph MRN: [id number] DOB:01-Oct-1980, 43 y.o., male Today's Date: 10/08/2023  END OF SESSION:  PT End of Session - 10/08/23 1516     Visit Number 7    Number of Visits 24    Date for PT Re-Evaluation 11/28/23    Authorization Type [employer]    PT Start Time 1515    PT Stop Time 1557    PT Time Calculation (min) 42 min    Activity Tolerance Patient tolerated treatment well    Behavior During Therapy WFL for tasks assessed/performed          Past Medical History:  Diagnosis Date   Depression    Environmental allergies    Hyperlipidemia    Hypertension    Sarcoidosis of lung (HCC)    Syncope and collapse    Past Surgical History:  Procedure Laterality Date   LUNG BIOPSY Left    SHOULDER SURGERY Right    SHOULDER SURGERY Left 11/27/2021   WISDOM TOOTH EXTRACTION     Patient Active Problem List   Diagnosis Date Noted   Neutropenia (HCC) 08/15/2022   Family history of prostate cancer 06/09/2022   Healthcare maintenance 06/09/2022   Visit for suture removal 05/20/2022   Slow transit constipation 05/20/2022   Benign prostatic hyperplasia with urinary hesitancy 05/20/2022   Syncope and collapse    Cluster headaches 07/07/2019   Essential hypertension 07/07/2019   Fatigue 07/07/2019   ADD (attention deficit disorder) 07/07/2019   Severe depression (HCC) 07/07/2019   IBS (irritable colon syndrome) 07/07/2019   Elevated serum creatinine 07/07/2019   Chronic pansinusitis 11/30/2018   Perennial allergic rhinitis 11/30/2018   Tobacco use disorder 01/29/2012   Environmental allergies 01/28/2012   Plantar fascial fibromatosis 12/23/2011   Asthma 07/25/2011   Sarcoidosis 07/25/2011   Pain in joint involving lower leg 06/10/2011   Primary localized osteoarthrosis, lower leg 06/10/2011    PCP: Berneta Elsie Sayre, MD  REFERRING PROVIDER: Genelle Elspeth, MD  REFERRING DIAG: [phone] (ICD-10-CM) - Tear of  left acetabular labrum, initial encounter; L hip arthroscopy with labral repair DOS 09/03/23  THERAPY DIAG:  Pain in left hip  Muscle weakness (generalized)  Other abnormalities of gait and mobility  Other symptoms and signs involving the musculoskeletal system  Rationale for Evaluation and Treatment: Rehabilitation  ONSET DATE:  DOS 09/03/23  SUBJECTIVE:   SUBJECTIVE STATEMENT: Patient states sore in hamstrings and groin.   EVAL: Patient states he has not been putting weight on his leg yet with walking. Sleeping in recliner is more comfortable than bed. Using crutches. Normally active: weightlifting, riding bike, etc.   PERTINENT HISTORY: HTN, HLD, sarcoidosis of lung, hx LBP PAIN:  Are you having pain? Yes: NPRS scale: 0/10 Pain location: L hip Pain description: all of the above Aggravating factors: sleeping in bed Relieving factors: rest  PRECAUTIONS: Other: L hip arthroscopy with labral repair   WEIGHT BEARING RESTRICTIONS: WBAT  FALLS:  Has patient fallen in last 6 months? No  OCCUPATION: [employer]  PLOF: Independent  PATIENT GOALS: strengthening, decrease pain, rehab from surgery  OBJECTIVE: (objective measures from initial evaluation unless otherwise dated)  Observation: sutures intact, No s/s of infection  PATIENT SURVEYS:  LEFS  Extreme difficulty/unable (0), Quite a bit of difficulty (1), Moderate difficulty (2), Little difficulty (3), No difficulty (4) Survey date:  09/05/23  Any of your usual work, housework or school activities 0  2. Usual hobbies, recreational or sporting activities  0  3. Getting into/out of the bath 0  4. Walking between rooms 1  5. Putting on socks/shoes 1  6. Squatting  1  7. Lifting an object, like a bag of groceries from the floor 1  8. Performing light activities around your home 2  9. Performing heavy activities around your home 0  10. Getting into/out of a car 1  11. Walking 2 blocks 1  12. Walking 1 mile 0  13.  Going up/down 10 stairs (1 flight) 1  14. Standing for 1 hour 1  15.  sitting for 1 hour 1  16. Running on even ground 0  17. Running on uneven ground 0  18. Making sharp turns while running fast 0  19. Hopping  0  20. Rolling over in bed 1  Score total:  12/80     COGNITION: Overall cognitive status: Within functional limits for tasks assessed     SENSATION: WFL  POSTURE: No Significant postural limitations  PALPATION: Grossly Tender L hip  LOWER EXTREMITY ROM:  Passive ROM Right eval Left eval  Hip flexion  90  Hip extension    Hip abduction  30  Hip adduction    Hip internal rotation  20  Hip external rotation  20  Knee flexion    Knee extension    Ankle dorsiflexion    Ankle plantarflexion    Ankle inversion    Ankle eversion     (Blank rows = not tested) *= pain/symptoms  LOWER EXTREMITY MMT:  MMT Right eval Left eval  Hip flexion    Hip extension    Hip abduction    Hip adduction    Hip internal rotation    Hip external rotation    Knee flexion 5 4- *  Knee extension 5 4  Ankle dorsiflexion    Ankle plantarflexion    Ankle inversion    Ankle eversion     (Blank rows = not tested) *= pain/symptoms   FUNCTIONAL TESTS:  NT due to post op status  GAIT: Distance walked: 100 feet Assistive device utilized: Crutches Level of assistance: Modified independence Comments: NWB L hip; able to ambulate with bilateral axillary crutches following cueing/education   TODAY'S TREATMENT:                                                                                                                              DATE:  10/08/23 Active hamstring stretch 10 x 10 second holds Manual: STM to hip flexors Prone quad stretch 4 x 20 second holds Step up 6 inch 3 x 10 Lateral step down 4 inch 2 x 10 Standing hip abduction GTB 3 x 10 Squat 2 x 10   10/06/23 Manual: STM to glutes Prone hip extension 3# 3 x 10 Step up 4 inch 3 x 10 SLS 4 x 30 second holds with  intermittent UE support Hip hinge 2  x 10  Plank on knees 3  x 20-30 second holds Gait mechanics  09/29/23 Upright bike 5 minutes Tall kneeling with shoulder extension BTB 3 x 10 Tall kneeling hip hinge 20# 3 x 10 1/2 kneeling lift 20# 3 x 10 Prone hip extension 3# 3 x 10 Hip circles CW/CCW 2 x 10 STS 1 x 10, with airex 2 x 10  Hip hinge 1  x 10   09/24/23 Upright bike 5 minutes  Bridge 10# 2 x 10, GTB at knees with clam 3 x 10 Sidelying clam GTB 3 x 10 Prone hip extension 3# 3 x 10 Tall kneeling hip hinge 10# 3 x 10 Tall kneeling with shoulder extension GTB 3 x 10 1/2 kneeling lift 10# 3 x 10 STS from elevated plinth 2 x 10   09/17/23 Bridge 3 x 10 Sidelying reverse clam 2 x 10 Prone hamstring curl 5# 3 x 10 Prone hip extension 3 x 10 Prone hip ER/IR small range 2 x 10 Quadruped rocking 2 x 10 Quadruped pelvic tilts 2 x 10 Quadruped shoulder flexion 2 x 10 Tall kneeling hip hinge 2 x 10 Gait mechanics with glute set with SPC Manual: STM to hip flexors/adductors  09/10/23 Gait mechanics with glute set with SPC Bent knee fall outs 2 x 10 Sidelying clam 3 x 10 Prone hamstring curl 3# 2 x 10 Prone hip ER/IR small range 2 x 10  Quadruped rocking 2 x 10 Quadruped pelvic tilts 2 x 10 Dressing change   09/05/23 Eval, HEP, Education, dressing change, gait mechanics   PATIENT EDUCATION:  Education details: Patient educated on exam findings, POC, scope of PT, HEP, protocol/precaution, gait mechanics, and dressing change. 09/10/23: HEP, proper activity levels/progressions Person educated: Patient Education method: Explanation, Demonstration, and Handouts Education comprehension: verbalized understanding, returned demonstration, verbal cues required, and tactile cues required  HOME EXERCISE PROGRAM: Access Code: [email or web address] URL: [email or web address] Date: 09/05/2023 Prepared by: Prentice Ebony Yorio  Exercises - Gluteal Sets (Mirrored)  - 3 x daily - 7 x weekly -  2 sets - 10 reps - 5-10 second hold - Supine Quadricep Sets (Mirrored)  - 3 x daily - 7 x weekly - 2 sets - 10 reps - 5-10 second hold - Clamshell (Mirrored)  - 3 x daily - 7 x weekly - 2 sets - 10 reps - Bent Knee Fallouts (Mirrored)  - 3 x daily - 7 x weekly - 2 sets - 10 reps  ASSESSMENT:  CLINICAL IMPRESSION: Manual to hyperactive and tender hip flexors  with decrease in tissue tension following. Progressive functional and glute strengthening tolerated well.  Patient will continue to benefit from physical therapy in order to improve function and reduce impairment.   OBJECTIVE IMPAIRMENTS: Abnormal gait, decreased activity tolerance, decreased balance, decreased endurance, decreased mobility, difficulty walking, decreased ROM, decreased strength, increased muscle spasms, impaired flexibility, improper body mechanics, and pain.   ACTIVITY LIMITATIONS: carrying, lifting, bending, standing, squatting, sleeping, stairs, transfers, bed mobility, bathing, toileting, dressing, hygiene/grooming, locomotion level, and caring for others  PARTICIPATION LIMITATIONS: meal prep, cleaning, laundry, driving, shopping, community activity, occupation, and yard work  PERSONAL FACTORS: Time since onset of injury/illness/exacerbation and 3+ comorbidities: HTN, HLD, sarcoidosis of lung are also affecting patient's functional outcome.   REHAB POTENTIAL: Good  CLINICAL DECISION MAKING: Stable/uncomplicated  EVALUATION COMPLEXITY: Low   GOALS: Goals reviewed with patient? Yes  SHORT TERM GOALS: Target date: 10/03/2023    Patient will be independent with HEP in order to improve functional outcomes. Baseline: Goal status: INITIAL  2.  Patient  will report at least 25% improvement in symptoms for improved quality of life. Baseline: Goal status: INITIAL  3.  SLS 30s without compensation Baseline:  Goal status: INITIAL      LONG TERM GOALS: Target date: 11/28/2023    Patient will report at least  75% improvement in symptoms for improved quality of life. Baseline:  Goal status: INITIAL  2.  Patient will improve LEFS score by at least 48 points in order to indicate improved tolerance to activity. Baseline: 12/80 Goal status: INITIAL  3.  Patient will be able to navigate stairs with reciprocal pattern without compensation in order to demonstrate improved LE strength. Baseline:  Goal status: INITIAL  4.  Patient will demonstrate MMT 90% of opposite lower extremity in all tested musculature as evidence of improved strength to assist with stair ambulation and gait. Baseline:  Goal status: INITIAL  5.  Patient will be able to return to all activities unrestricted for improved ability to perform work functions and participate with family.  Baseline:  Goal status: INITIAL    PLAN:  PT FREQUENCY: 1-2x/week  PT DURATION: 12 weeks  PLANNED INTERVENTIONS: 97164- PT Re-evaluation, 97110-Therapeutic exercises, 97530- Therapeutic activity, [id number]- Neuromuscular re-education, 97535- Self Care, [id number]- Manual therapy, [id number]- Gait training, [phone]- Orthotic Fit/training, [phone]- Canalith repositioning, [id number]- Aquatic Therapy, [phone]- Splinting, [phone]- Wound care (first 20 sq cm), 97598- Wound care (each additional 20 sq cm)Patient/Family education, Balance training, Stair training, Taping, Dry Needling, Joint mobilization, Joint manipulation, Spinal manipulation, Spinal mobilization, Scar mobilization, and DME instructions.  PLAN FOR NEXT SESSION: progress with hip labral repair protocol    Prentice GORMAN Stains, PT, DPT 10/08/2023, 3:57 PM

## 2023-10-13 ENCOUNTER — Ambulatory Visit (HOSPITAL_BASED_OUTPATIENT_CLINIC_OR_DEPARTMENT_OTHER): Admitting: Physical Therapy

## 2023-10-13 ENCOUNTER — Encounter (HOSPITAL_BASED_OUTPATIENT_CLINIC_OR_DEPARTMENT_OTHER): Payer: Self-pay | Admitting: Physical Therapy

## 2023-10-13 DIAGNOSIS — M6281 Muscle weakness (generalized): Secondary | ICD-10-CM

## 2023-10-13 DIAGNOSIS — R2689 Other abnormalities of gait and mobility: Secondary | ICD-10-CM | POA: Diagnosis not present

## 2023-10-13 DIAGNOSIS — M25552 Pain in left hip: Secondary | ICD-10-CM | POA: Diagnosis not present

## 2023-10-13 DIAGNOSIS — M5459 Other low back pain: Secondary | ICD-10-CM

## 2023-10-13 DIAGNOSIS — R29898 Other symptoms and signs involving the musculoskeletal system: Secondary | ICD-10-CM

## 2023-10-13 NOTE — Therapy (Signed)
 OUTPATIENT PHYSICAL THERAPY TREATMENT   Patient Name: Dominic Joseph MRN: [id number] DOB:April 13, 1980, 43 y.o., male Today's Date: 10/13/2023  END OF SESSION:  PT End of Session - 10/13/23 0824     Visit Number 8    Number of Visits 24    Date for PT Re-Evaluation 11/28/23    Authorization Type [employer]    PT Start Time 0715    PT Stop Time 0755    PT Time Calculation (min) 40 min    Activity Tolerance Patient tolerated treatment well    Behavior During Therapy [facility] for tasks assessed/performed           Past Medical History:  Diagnosis Date   Depression    Environmental allergies    Hyperlipidemia    Hypertension    Sarcoidosis of lung (HCC)    Syncope and collapse    Past Surgical History:  Procedure Laterality Date   LUNG BIOPSY Left    SHOULDER SURGERY Right    SHOULDER SURGERY Left 11/27/2021   WISDOM TOOTH EXTRACTION     Patient Active Problem List   Diagnosis Date Noted   Neutropenia (HCC) 08/15/2022   Family history of prostate cancer 06/09/2022   Healthcare maintenance 06/09/2022   Visit for suture removal 05/20/2022   Slow transit constipation 05/20/2022   Benign prostatic hyperplasia with urinary hesitancy 05/20/2022   Syncope and collapse    Cluster headaches 07/07/2019   Essential hypertension 07/07/2019   Fatigue 07/07/2019   ADD (attention deficit disorder) 07/07/2019   Severe depression (HCC) 07/07/2019   IBS (irritable colon syndrome) 07/07/2019   Elevated serum creatinine 07/07/2019   Chronic pansinusitis 11/30/2018   Perennial allergic rhinitis 11/30/2018   Tobacco use disorder 01/29/2012   Environmental allergies 01/28/2012   Plantar fascial fibromatosis 12/23/2011   Asthma 07/25/2011   Sarcoidosis 07/25/2011   Pain in joint involving lower leg 06/10/2011   Primary localized osteoarthrosis, lower leg 06/10/2011    PCP: Berneta Elsie Sayre, MD  REFERRING PROVIDER: Genelle Elspeth, MD  REFERRING DIAG: [phone] (ICD-10-CM) - Tear  of left acetabular labrum, initial encounter; L hip arthroscopy with labral repair DOS 09/03/23  THERAPY DIAG:  Pain in left hip  Muscle weakness (generalized)  Other abnormalities of gait and mobility  Other low back pain  Other symptoms and signs involving the musculoskeletal system  Rationale for Evaluation and Treatment: Rehabilitation  ONSET DATE:  DOS 09/03/23  SUBJECTIVE:   SUBJECTIVE STATEMENT: Patient denies any significant changes in his pain or symptoms. He continues to have tightness in his groin.   EVAL: Patient states he has not been putting weight on his leg yet with walking. Sleeping in recliner is more comfortable than bed. Using crutches. Normally active: weightlifting, riding bike, etc.   PERTINENT HISTORY: HTN, HLD, sarcoidosis of lung, hx LBP PAIN:  Are you having pain? Yes: NPRS scale: 0/10 Pain location: L hip Pain description: all of the above Aggravating factors: sleeping in bed Relieving factors: rest   PRECAUTIONS: Other: L hip arthroscopy with labral repair   WEIGHT BEARING RESTRICTIONS: WBAT  FALLS:  Has patient fallen in last 6 months? No  OCCUPATION: [employer]  PLOF: Independent  PATIENT GOALS: strengthening, decrease pain, rehab from surgery  OBJECTIVE: (objective measures from initial evaluation unless otherwise dated)  Observation: sutures intact, No s/s of infection  PATIENT SURVEYS:  LEFS  Extreme difficulty/unable (0), Quite a bit of difficulty (1), Moderate difficulty (2), Little difficulty (3), No difficulty (4) Survey date:  09/05/23  Any of your usual work, housework or school activities 0  2. Usual hobbies, recreational or sporting activities 0  3. Getting into/out of the bath 0  4. Walking between rooms 1  5. Putting on socks/shoes 1  6. Squatting  1  7. Lifting an object, like a bag of groceries from the floor 1  8. Performing light activities around your home 2  9. Performing heavy activities around your home  0  10. Getting into/out of a car 1  11. Walking 2 blocks 1  12. Walking 1 mile 0  13. Going up/down 10 stairs (1 flight) 1  14. Standing for 1 hour 1  15.  sitting for 1 hour 1  16. Running on even ground 0  17. Running on uneven ground 0  18. Making sharp turns while running fast 0  19. Hopping  0  20. Rolling over in bed 1  Score total:  12/80     COGNITION: Overall cognitive status: Within functional limits for tasks assessed     SENSATION: WFL  POSTURE: No Significant postural limitations  PALPATION: Grossly Tender L hip  LOWER EXTREMITY ROM:  Passive ROM Right eval Left eval  Hip flexion  90  Hip extension    Hip abduction  30  Hip adduction    Hip internal rotation  20  Hip external rotation  20  Knee flexion    Knee extension    Ankle dorsiflexion    Ankle plantarflexion    Ankle inversion    Ankle eversion     (Blank rows = not tested) *= pain/symptoms  LOWER EXTREMITY MMT:  MMT Right eval Left eval  Hip flexion    Hip extension    Hip abduction    Hip adduction    Hip internal rotation    Hip external rotation    Knee flexion 5 4- *  Knee extension 5 4  Ankle dorsiflexion    Ankle plantarflexion    Ankle inversion    Ankle eversion     (Blank rows = not tested) *= pain/symptoms   FUNCTIONAL TESTS:  NT due to post op status  GAIT: Distance walked: 100 feet Assistive device utilized: Crutches Level of assistance: Modified independence Comments: NWB L hip; able to ambulate with bilateral axillary crutches following cueing/education   TODAY'S TREATMENT:                                                                                                                              DATE:  10/13/2023: Upright bike 5 minutes, lvl 7 Step up 6 inch 2 x 10 Lateral step up 6 step 2 10 Standing side stepping with BTB to fatigue Retro walking with 50lb cable pull  Bridges with SL eccentric control.  Dbl leg press within 90 degree range of  motion 3x10.  Modified thomas stretch to tolerance for hip flexor stretch.    10/08/23 Active hamstring stretch 10 x 10 second  holds Manual: STM to hip flexors Prone quad stretch 4 x 20 second holds Step up 6 inch 3 x 10 Lateral step down 4 inch 2 x 10 Standing hip abduction GTB 3 x 10 Squat 2 x 10   10/06/23 Manual: STM to glutes Prone hip extension 3# 3 x 10 Step up 4 inch 3 x 10 SLS 4 x 30 second holds with intermittent UE support Hip hinge 2  x 10  Plank on knees 3 x 20-30 second holds Gait mechanics  09/29/23 Upright bike 5 minutes Tall kneeling with shoulder extension BTB 3 x 10 Tall kneeling hip hinge 20# 3 x 10 1/2 kneeling lift 20# 3 x 10 Prone hip extension 3# 3 x 10 Hip circles CW/CCW 2 x 10 STS 1 x 10, with airex 2 x 10  Hip hinge 1  x 10   09/24/23 Upright bike 5 minutes  Bridge 10# 2 x 10, GTB at knees with clam 3 x 10 Sidelying clam GTB 3 x 10 Prone hip extension 3# 3 x 10 Tall kneeling hip hinge 10# 3 x 10 Tall kneeling with shoulder extension GTB 3 x 10 1/2 kneeling lift 10# 3 x 10 STS from elevated plinth 2 x 10   09/17/23 Bridge 3 x 10 Sidelying reverse clam 2 x 10 Prone hamstring curl 5# 3 x 10 Prone hip extension 3 x 10 Prone hip ER/IR small range 2 x 10 Quadruped rocking 2 x 10 Quadruped pelvic tilts 2 x 10 Quadruped shoulder flexion 2 x 10 Tall kneeling hip hinge 2 x 10 Gait mechanics with glute set with SPC Manual: STM to hip flexors/adductors  09/10/23 Gait mechanics with glute set with SPC Bent knee fall outs 2 x 10 Sidelying clam 3 x 10 Prone hamstring curl 3# 2 x 10 Prone hip ER/IR small range 2 x 10  Quadruped rocking 2 x 10 Quadruped pelvic tilts 2 x 10 Dressing change   09/05/23 Eval, HEP, Education, dressing change, gait mechanics   PATIENT EDUCATION:  Education details: Patient educated on exam findings, POC, scope of PT, HEP, protocol/precaution, gait mechanics, and dressing change. 09/10/23: HEP, proper activity  levels/progressions Person educated: Patient Education method: Explanation, Demonstration, and Handouts Education comprehension: verbalized understanding, returned demonstration, verbal cues required, and tactile cues required  HOME EXERCISE PROGRAM: Access Code: [email or web address] URL: [email or web address] Date: 09/05/2023 Prepared by: Prentice Zaunegger  Exercises - Gluteal Sets (Mirrored)  - 3 x daily - 7 x weekly - 2 sets - 10 reps - 5-10 second hold - Supine Quadricep Sets (Mirrored)  - 3 x daily - 7 x weekly - 2 sets - 10 reps - 5-10 second hold - Clamshell (Mirrored)  - 3 x daily - 7 x weekly - 2 sets - 10 reps - Bent Knee Fallouts (Mirrored)  - 3 x daily - 7 x weekly - 2 sets - 10 reps  ASSESSMENT:  CLINICAL IMPRESSION: Pt tolerated progressions with focus on glute strengthening well today. He denied pain, but reports fatigue. Pt reports continued tightness in his deep hip, but eased up with hip flexor stretch. Encouraged continued use of ice and ibuprofen  as needed to help with inflammation. Patient will continue to benefit from physical therapy in order to improve function and reduce impairment.   OBJECTIVE IMPAIRMENTS: Abnormal gait, decreased activity tolerance, decreased balance, decreased endurance, decreased mobility, difficulty walking, decreased ROM, decreased strength, increased muscle spasms, impaired flexibility, improper body mechanics, and pain.   ACTIVITY LIMITATIONS: carrying, lifting, bending, standing, squatting,  sleeping, stairs, transfers, bed mobility, bathing, toileting, dressing, hygiene/grooming, locomotion level, and caring for others  PARTICIPATION LIMITATIONS: meal prep, cleaning, laundry, driving, shopping, community activity, occupation, and yard work  PERSONAL FACTORS: Time since onset of injury/illness/exacerbation and 3+ comorbidities: HTN, HLD, sarcoidosis of lung are also affecting patient's functional outcome.   REHAB POTENTIAL:  Good  CLINICAL DECISION MAKING: Stable/uncomplicated  EVALUATION COMPLEXITY: Low   GOALS: Goals reviewed with patient? Yes  SHORT TERM GOALS: Target date: 10/03/2023    Patient will be independent with HEP in order to improve functional outcomes. Baseline: Goal status: INITIAL  2.  Patient will report at least 25% improvement in symptoms for improved quality of life. Baseline: Goal status: INITIAL  3.  SLS 30s without compensation Baseline:  Goal status: INITIAL      LONG TERM GOALS: Target date: 11/28/2023    Patient will report at least 75% improvement in symptoms for improved quality of life. Baseline:  Goal status: INITIAL  2.  Patient will improve LEFS score by at least 48 points in order to indicate improved tolerance to activity. Baseline: 12/80 Goal status: INITIAL  3.  Patient will be able to navigate stairs with reciprocal pattern without compensation in order to demonstrate improved LE strength. Baseline:  Goal status: INITIAL  4.  Patient will demonstrate MMT 90% of opposite lower extremity in all tested musculature as evidence of improved strength to assist with stair ambulation and gait. Baseline:  Goal status: INITIAL  5.  Patient will be able to return to all activities unrestricted for improved ability to perform work functions and participate with family.  Baseline:  Goal status: INITIAL    PLAN:  PT FREQUENCY: 1-2x/week  PT DURATION: 12 weeks  PLANNED INTERVENTIONS: 97164- PT Re-evaluation, 97110-Therapeutic exercises, 97530- Therapeutic activity, [id number]- Neuromuscular re-education, 97535- Self Care, 02859- Manual therapy, [id number]- Gait training, [phone]- Orthotic Fit/training, [phone]- Canalith repositioning, [id number]- Aquatic Therapy, [phone]- Splinting, [phone]- Wound care (first 20 sq cm), 97598- Wound care (each additional 20 sq cm)Patient/Family education, Balance training, Stair training, Taping, Dry Needling, Joint mobilization, Joint  manipulation, Spinal manipulation, Spinal mobilization, Scar mobilization, and DME instructions.  PLAN FOR NEXT SESSION: progress with hip labral repair protocol    Rojean JONELLE Batten, PT, DPT 10/13/2023, 8:26 AM

## 2023-10-13 NOTE — Therapy (Unsigned)
 OUTPATIENT PHYSICAL THERAPY TREATMENT   Patient Name: Dominic Joseph MRN: [id number] DOB:01-13-1981, 43 y.o., male Today's Date: 10/15/2023  END OF SESSION:  PT End of Session - 10/15/23 1511     Visit Number 9    Number of Visits 24    Date for PT Re-Evaluation 11/28/23    Authorization Type [employer]    PT Start Time 0228    PT Stop Time 0310    PT Time Calculation (min) 42 min    Activity Tolerance Patient tolerated treatment well    Behavior During Therapy [facility] for tasks assessed/performed            Past Medical History:  Diagnosis Date   Depression    Environmental allergies    Hyperlipidemia    Hypertension    Sarcoidosis of lung (HCC)    Syncope and collapse    Past Surgical History:  Procedure Laterality Date   LUNG BIOPSY Left    SHOULDER SURGERY Right    SHOULDER SURGERY Left 11/27/2021   WISDOM TOOTH EXTRACTION     Patient Active Problem List   Diagnosis Date Noted   Neutropenia (HCC) 08/15/2022   Family history of prostate cancer 06/09/2022   Healthcare maintenance 06/09/2022   Visit for suture removal 05/20/2022   Slow transit constipation 05/20/2022   Benign prostatic hyperplasia with urinary hesitancy 05/20/2022   Syncope and collapse    Cluster headaches 07/07/2019   Essential hypertension 07/07/2019   Fatigue 07/07/2019   ADD (attention deficit disorder) 07/07/2019   Severe depression (HCC) 07/07/2019   IBS (irritable colon syndrome) 07/07/2019   Elevated serum creatinine 07/07/2019   Chronic pansinusitis 11/30/2018   Perennial allergic rhinitis 11/30/2018   Tobacco use disorder 01/29/2012   Environmental allergies 01/28/2012   Plantar fascial fibromatosis 12/23/2011   Asthma 07/25/2011   Sarcoidosis 07/25/2011   Pain in joint involving lower leg 06/10/2011   Primary localized osteoarthrosis, lower leg 06/10/2011    PCP: Berneta Elsie Sayre, MD  REFERRING PROVIDER: Genelle Elspeth, MD  REFERRING DIAG: [phone] (ICD-10-CM) - Tear  of left acetabular labrum, initial encounter; L hip arthroscopy with labral repair DOS 09/03/23  THERAPY DIAG:  Pain in left hip  Muscle weakness (generalized)  Other abnormalities of gait and mobility  Rationale for Evaluation and Treatment: Rehabilitation  ONSET DATE:  DOS 09/03/23  SUBJECTIVE:   SUBJECTIVE STATEMENT: Patient states that he is feeling good today. Not feeling as stiff or tight.   EVAL: Patient states he has not been putting weight on his leg yet with walking. Sleeping in recliner is more comfortable than bed. Using crutches. Normally active: weightlifting, riding bike, etc.   PERTINENT HISTORY: HTN, HLD, sarcoidosis of lung, hx LBP PAIN:  Are you having pain? Yes: NPRS scale: 0/10 Pain location: L hip Pain description: all of the above Aggravating factors: sleeping in bed Relieving factors: rest   PRECAUTIONS: Other: L hip arthroscopy with labral repair   WEIGHT BEARING RESTRICTIONS: WBAT  FALLS:  Has patient fallen in last 6 months? No  OCCUPATION: [employer]  PLOF: Independent  PATIENT GOALS: strengthening, decrease pain, rehab from surgery  OBJECTIVE: (objective measures from initial evaluation unless otherwise dated)  Observation: sutures intact, No s/s of infection  PATIENT SURVEYS:  LEFS  Extreme difficulty/unable (0), Quite a bit of difficulty (1), Moderate difficulty (2), Little difficulty (3), No difficulty (4) Survey date:  09/05/23  Any of your usual work, housework or school activities 0  2. Usual hobbies, recreational or sporting  activities 0  3. Getting into/out of the bath 0  4. Walking between rooms 1  5. Putting on socks/shoes 1  6. Squatting  1  7. Lifting an object, like a bag of groceries from the floor 1  8. Performing light activities around your home 2  9. Performing heavy activities around your home 0  10. Getting into/out of a car 1  11. Walking 2 blocks 1  12. Walking 1 mile 0  13. Going up/down 10 stairs (1  flight) 1  14. Standing for 1 hour 1  15.  sitting for 1 hour 1  16. Running on even ground 0  17. Running on uneven ground 0  18. Making sharp turns while running fast 0  19. Hopping  0  20. Rolling over in bed 1  Score total:  12/80     COGNITION: Overall cognitive status: Within functional limits for tasks assessed     SENSATION: WFL  POSTURE: No Significant postural limitations  PALPATION: Grossly Tender L hip  LOWER EXTREMITY ROM:  Passive ROM Right eval Left eval  Hip flexion  90  Hip extension    Hip abduction  30  Hip adduction    Hip internal rotation  20  Hip external rotation  20  Knee flexion    Knee extension    Ankle dorsiflexion    Ankle plantarflexion    Ankle inversion    Ankle eversion     (Blank rows = not tested) *= pain/symptoms  LOWER EXTREMITY MMT:  MMT Right eval Left eval  Hip flexion    Hip extension    Hip abduction    Hip adduction    Hip internal rotation    Hip external rotation    Knee flexion 5 4- *  Knee extension 5 4  Ankle dorsiflexion    Ankle plantarflexion    Ankle inversion    Ankle eversion     (Blank rows = not tested) *= pain/symptoms   FUNCTIONAL TESTS:  NT due to post op status  GAIT: Distance walked: 100 feet Assistive device utilized: Crutches Level of assistance: Modified independence Comments: NWB L hip; able to ambulate with bilateral axillary crutches following cueing/education   TODAY'S TREATMENT:                                                                                                                              DATE:  10/15/2023: Upright bike 5 minutes, lvl 9 Step up 6 inch 2 x 10 Lateral step up 6 step 2 10 Bridges with SL eccentric control.  Modified thomas stretch to tolerance for hip flexor stretch Side stepping with BTB  Standing hip extension with BTB Standing hip abduction with BTB  Lap around clinic to cool down and assess gait.   10/13/2023 Upright bike 5 minutes,  lvl 9 Step up 6 inch 2 x 10 Lateral step up 6 step 2 10 Standing side stepping with BTB  to fatigue Retro walking with 50lb cable pull  Bridges with SL eccentric control.  Dbl leg press within 90 degree range of motion 3x10.  Modified thomas stretch to tolerance for hip flexor stretch  10/13/2023: Upright bike 5 minutes, lvl 7 Step up 6 inch 2 x 10 Lateral step up 6 step 2 10 Standing side stepping with BTB to fatigue Retro walking with 50lb cable pull  Bridges with SL eccentric control.  Dbl leg press within 90 degree range of motion 3x10.  Modified thomas stretch to tolerance for hip flexor stretch.    10/08/23 Active hamstring stretch 10 x 10 second holds Manual: STM to hip flexors Prone quad stretch 4 x 20 second holds Step up 6 inch 3 x 10 Lateral step down 4 inch 2 x 10 Standing hip abduction GTB 3 x 10 Squat 2 x 10   10/06/23 Manual: STM to glutes Prone hip extension 3# 3 x 10 Step up 4 inch 3 x 10 SLS 4 x 30 second holds with intermittent UE support Hip hinge 2  x 10  Plank on knees 3 x 20-30 second holds Gait mechanics  09/29/23 Upright bike 5 minutes Tall kneeling with shoulder extension BTB 3 x 10 Tall kneeling hip hinge 20# 3 x 10 1/2 kneeling lift 20# 3 x 10 Prone hip extension 3# 3 x 10 Hip circles CW/CCW 2 x 10 STS 1 x 10, with airex 2 x 10  Hip hinge 1  x 10   09/24/23 Upright bike 5 minutes  Bridge 10# 2 x 10, GTB at knees with clam 3 x 10 Sidelying clam GTB 3 x 10 Prone hip extension 3# 3 x 10 Tall kneeling hip hinge 10# 3 x 10 Tall kneeling with shoulder extension GTB 3 x 10 1/2 kneeling lift 10# 3 x 10 STS from elevated plinth 2 x 10   09/17/23 Bridge 3 x 10 Sidelying reverse clam 2 x 10 Prone hamstring curl 5# 3 x 10 Prone hip extension 3 x 10 Prone hip ER/IR small range 2 x 10 Quadruped rocking 2 x 10 Quadruped pelvic tilts 2 x 10 Quadruped shoulder flexion 2 x 10 Tall kneeling hip hinge 2 x 10 Gait mechanics with glute set with  SPC Manual: STM to hip flexors/adductors  09/10/23 Gait mechanics with glute set with SPC Bent knee fall outs 2 x 10 Sidelying clam 3 x 10 Prone hamstring curl 3# 2 x 10 Prone hip ER/IR small range 2 x 10  Quadruped rocking 2 x 10 Quadruped pelvic tilts 2 x 10 Dressing change   09/05/23 Eval, HEP, Education, dressing change, gait mechanics   PATIENT EDUCATION:  Education details: Patient educated on exam findings, POC, scope of PT, HEP, protocol/precaution, gait mechanics, and dressing change. 09/10/23: HEP, proper activity levels/progressions Person educated: Patient Education method: Explanation, Demonstration, and Handouts Education comprehension: verbalized understanding, returned demonstration, verbal cues required, and tactile cues required  HOME EXERCISE PROGRAM: Access Code: [email or web address] URL: [email or web address] Date: 09/05/2023 Prepared by: Prentice Zaunegger  Exercises - Gluteal Sets (Mirrored)  - 3 x daily - 7 x weekly - 2 sets - 10 reps - 5-10 second hold - Supine Quadricep Sets (Mirrored)  - 3 x daily - 7 x weekly - 2 sets - 10 reps - 5-10 second hold - Clamshell (Mirrored)  - 3 x daily - 7 x weekly - 2 sets - 10 reps - Bent Knee Fallouts (Mirrored)  - 3 x daily - 7 x weekly - 2 sets -  10 reps  ASSESSMENT:  CLINICAL IMPRESSION: Pt tolerated progressions with focus on glute strengthening well today. He denied pain, but demonstrates fatigue. Pt notes improvements in his hip tightness overall. Pt will continue to benefit from strengthening and mobility per protocol. Patient will continue to benefit from physical therapy in order to improve function and reduce impairment.   OBJECTIVE IMPAIRMENTS: Abnormal gait, decreased activity tolerance, decreased balance, decreased endurance, decreased mobility, difficulty walking, decreased ROM, decreased strength, increased muscle spasms, impaired flexibility, improper body mechanics, and pain.   ACTIVITY LIMITATIONS:  carrying, lifting, bending, standing, squatting, sleeping, stairs, transfers, bed mobility, bathing, toileting, dressing, hygiene/grooming, locomotion level, and caring for others  PARTICIPATION LIMITATIONS: meal prep, cleaning, laundry, driving, shopping, community activity, occupation, and yard work  PERSONAL FACTORS: Time since onset of injury/illness/exacerbation and 3+ comorbidities: HTN, HLD, sarcoidosis of lung are also affecting patient's functional outcome.   REHAB POTENTIAL: Good  CLINICAL DECISION MAKING: Stable/uncomplicated  EVALUATION COMPLEXITY: Low   GOALS: Goals reviewed with patient? Yes  SHORT TERM GOALS: Target date: 10/03/2023    Patient will be independent with HEP in order to improve functional outcomes. Baseline: Goal status: INITIAL  2.  Patient will report at least 25% improvement in symptoms for improved quality of life. Baseline: Goal status: INITIAL  3.  SLS 30s without compensation Baseline:  Goal status: INITIAL      LONG TERM GOALS: Target date: 11/28/2023    Patient will report at least 75% improvement in symptoms for improved quality of life. Baseline:  Goal status: INITIAL  2.  Patient will improve LEFS score by at least 48 points in order to indicate improved tolerance to activity. Baseline: 12/80 Goal status: INITIAL  3.  Patient will be able to navigate stairs with reciprocal pattern without compensation in order to demonstrate improved LE strength. Baseline:  Goal status: INITIAL  4.  Patient will demonstrate MMT 90% of opposite lower extremity in all tested musculature as evidence of improved strength to assist with stair ambulation and gait. Baseline:  Goal status: INITIAL  5.  Patient will be able to return to all activities unrestricted for improved ability to perform work functions and participate with family.  Baseline:  Goal status: INITIAL    PLAN:  PT FREQUENCY: 1-2x/week  PT DURATION: 12 weeks  PLANNED  INTERVENTIONS: 97164- PT Re-evaluation, 97110-Therapeutic exercises, 97530- Therapeutic activity, [id number]- Neuromuscular re-education, 97535- Self Care, [id number]- Manual therapy, [id number]- Gait training, [phone]- Orthotic Fit/training, [phone]- Canalith repositioning, [id number]- Aquatic Therapy, [phone]- Splinting, [phone]- Wound care (first 20 sq cm), 97598- Wound care (each additional 20 sq cm)Patient/Family education, Balance training, Stair training, Taping, Dry Needling, Joint mobilization, Joint manipulation, Spinal manipulation, Spinal mobilization, Scar mobilization, and DME instructions.  PLAN FOR NEXT SESSION: progress with hip labral repair protocol    Rojean JONELLE Batten, PT, DPT 10/15/2023, 3:12 PM

## 2023-10-15 ENCOUNTER — Encounter (HOSPITAL_BASED_OUTPATIENT_CLINIC_OR_DEPARTMENT_OTHER): Payer: Self-pay | Admitting: Physical Therapy

## 2023-10-15 ENCOUNTER — Ambulatory Visit (HOSPITAL_BASED_OUTPATIENT_CLINIC_OR_DEPARTMENT_OTHER): Admitting: Physical Therapy

## 2023-10-15 DIAGNOSIS — R29898 Other symptoms and signs involving the musculoskeletal system: Secondary | ICD-10-CM | POA: Diagnosis not present

## 2023-10-15 DIAGNOSIS — M5459 Other low back pain: Secondary | ICD-10-CM | POA: Diagnosis not present

## 2023-10-15 DIAGNOSIS — M6281 Muscle weakness (generalized): Secondary | ICD-10-CM | POA: Diagnosis not present

## 2023-10-15 DIAGNOSIS — R2689 Other abnormalities of gait and mobility: Secondary | ICD-10-CM

## 2023-10-15 DIAGNOSIS — M25552 Pain in left hip: Secondary | ICD-10-CM | POA: Diagnosis not present

## 2023-10-21 ENCOUNTER — Ambulatory Visit (INDEPENDENT_AMBULATORY_CARE_PROVIDER_SITE_OTHER): Admitting: Physical Therapy

## 2023-10-21 ENCOUNTER — Other Ambulatory Visit (HOSPITAL_BASED_OUTPATIENT_CLINIC_OR_DEPARTMENT_OTHER): Payer: Self-pay

## 2023-10-21 ENCOUNTER — Encounter: Payer: Self-pay | Admitting: Physical Therapy

## 2023-10-21 ENCOUNTER — Ambulatory Visit (HOSPITAL_BASED_OUTPATIENT_CLINIC_OR_DEPARTMENT_OTHER): Admitting: Orthopaedic Surgery

## 2023-10-21 DIAGNOSIS — S73192A Other sprain of left hip, initial encounter: Secondary | ICD-10-CM

## 2023-10-21 DIAGNOSIS — M25552 Pain in left hip: Secondary | ICD-10-CM

## 2023-10-21 MED ORDER — METHYLPREDNISOLONE 4 MG PO TBPK
ORAL_TABLET | ORAL | 0 refills | Status: DC
  Filled 2023-10-21: qty 21, 6d supply, fill #0

## 2023-10-21 NOTE — Therapy (Signed)
  OUTPATIENT PHYSICAL THERAPY SCREEN @[address]   Patient Name: Dominic Joseph MRN: [id number] DOB:Feb 05, 1981, 43 y.o., male Today's Date: 10/21/2023  END OF SESSION:  PT End of Session - 10/21/23 1029     Visit Number 1          Past Medical History:  Diagnosis Date   Depression    Environmental allergies    Hyperlipidemia    Hypertension    Sarcoidosis of lung (HCC)    Syncope and collapse    Past Surgical History:  Procedure Laterality Date   LUNG BIOPSY Left    SHOULDER SURGERY Right    SHOULDER SURGERY Left 11/27/2021   WISDOM TOOTH EXTRACTION     Patient Active Problem List   Diagnosis Date Noted   Neutropenia (HCC) 08/15/2022   Family history of prostate cancer 06/09/2022   Healthcare maintenance 06/09/2022   Visit for suture removal 05/20/2022   Slow transit constipation 05/20/2022   Benign prostatic hyperplasia with urinary hesitancy 05/20/2022   Syncope and collapse    Cluster headaches 07/07/2019   Essential hypertension 07/07/2019   Fatigue 07/07/2019   ADD (attention deficit disorder) 07/07/2019   Severe depression (HCC) 07/07/2019   IBS (irritable colon syndrome) 07/07/2019   Elevated serum creatinine 07/07/2019   Chronic pansinusitis 11/30/2018   Perennial allergic rhinitis 11/30/2018   Tobacco use disorder 01/29/2012   Environmental allergies 01/28/2012   Plantar fascial fibromatosis 12/23/2011   Asthma 07/25/2011   Sarcoidosis 07/25/2011   Pain in joint involving lower leg 06/10/2011   Primary localized osteoarthrosis, lower leg 06/10/2011     THERAPY DIAG:  Pain in left hip  Goal of screen:  This patient was referred to Physical Therapy specialty screen by Elspeth Parker, MD for evaluation following injury.   Medbridge HEP code:  [email or web address]  [email or web address] Wear TENS with movement Gentle stretching STM with roller/tennis ball Rt foot on step with left leg hanging for distraction   Clinical Impression & Plan:  Pt with  significant spasm following catching his uncle on Saturday- He was standing in a lunge position with left leg back and pivoted to the left creating eccentric strain on surgical leg. Pt denied impingement with passive motion but did have significant stretch. Concordant pain in TFL, glut med and piriformis. Discussed TPDN which pt was open to using if appropriate when he sees [employer].    Harlene Cordon PT, DPT 10/21/2023, 10:33 AM  [address] [place], [place] [address] [phone]   Note: charges not applied for screen.

## 2023-10-21 NOTE — Progress Notes (Signed)
 Post Operative Evaluation    Procedure/Date of Surgery: Left hip arthroscopy with labral repair and cam debridement 6/12  Interval History:    Presents today 6 weeks status post the above procedure.  He did unfortunately have a recent injury where his uncle was grieving and jumped into him for which she had to catch him.  Since that time he has been having soreness about the glutes and hamstrings.  PMH/PSH/Family History/Social History/Meds/Allergies:    Past Medical History:  Diagnosis Date   Depression    Environmental allergies    Hyperlipidemia    Hypertension    Sarcoidosis of lung (HCC)    Syncope and collapse    Past Surgical History:  Procedure Laterality Date   LUNG BIOPSY Left    SHOULDER SURGERY Right    SHOULDER SURGERY Left 11/27/2021   WISDOM TOOTH EXTRACTION     Social History   Socioeconomic History   Marital status: Married    Spouse name: Not on file   Number of children: 4   Years of education: Not on file   Highest education level: Master's degree (e.g., MA, MS, MEng, MEd, MSW, MBA)  Occupational History   Occupation: Emergency planning/management officer  Tobacco Use   Smoking status: Some Days    Types: Cigars   Smokeless tobacco: Never  Vaping Use   Vaping status: Never Used  Substance and Sexual Activity   Alcohol use: Yes    Comment: occ   Drug use: Not Currently    Types: Marijuana    Comment: in high school   Sexual activity: Yes  Other Topics Concern   Not on file  Social History Narrative   Not on file   Social Drivers of Health   Financial Resource Strain: Low Risk  (04/12/2023)   Overall Financial Resource Strain (CARDIA)    Difficulty of Paying Living Expenses: Not hard at all  Food Insecurity: No Food Insecurity (04/12/2023)   Hunger Vital Sign    Worried About Running Out of Food in the Last Year: Never true    Ran Out of Food in the Last Year: Never true  Transportation Needs: No Transportation Needs  (04/12/2023)   PRAPARE - Administrator, Civil Service (Medical): No    Lack of Transportation (Non-Medical): No  Physical Activity: Insufficiently Active (04/12/2023)   Exercise Vital Sign    Days of Exercise per Week: 3 days    Minutes of Exercise per Session: 40 min  Stress: Stress Concern Present (04/12/2023)   [employer] of Occupational Health - Occupational Stress Questionnaire    Feeling of Stress : To some extent  Social Connections: Socially Integrated (04/12/2023)   Social Connection and Isolation Panel    Frequency of Communication with Friends and Family: More than three times a week    Frequency of Social Gatherings with Friends and Family: Never    Attends Religious Services: More than 4 times per year    Active Member of [employer] or Organizations: Yes    Attends Engineer, structural: More than 4 times per year    Marital Status: Married   Family History  Problem Relation Age of Onset   Hypertension Mother    Cardiomyopathy Father    Hypertension Sister    Hypertension Sister    Hypertension Brother  Cardiomyopathy Brother    Hyperlipidemia Maternal Grandmother    Hypertension Maternal Grandmother    Stroke Maternal Grandmother    Stroke Maternal Grandfather    No Known Allergies Current Outpatient Medications  Medication Sig Dispense Refill   methylPREDNISolone  (MEDROL  DOSEPAK) 4 MG TBPK tablet Take per packet instructions 21 each 0   albuterol  (VENTOLIN  HFA) 108 (90 Base) MCG/ACT inhaler Inhale 1-2 puffs into the lungs every 6 (six) hours as needed for wheezing or shortness of breath. 8 g 1   amLODipine  (NORVASC ) 10 MG tablet Take 1 tablet (10 mg total) by mouth daily. 90 tablet 2   aspirin  EC 325 MG tablet Take 1 tablet (325 mg total) by mouth daily. 14 tablet 0   celecoxib  (CELEBREX ) 200 MG capsule TAKE 1 CAPSULE BY MOUTH TWICE A DAY 60 capsule 0   docusate sodium  (COLACE) 100 MG capsule Take 1 capsule (100 mg total) by mouth 2 (two)  times daily as needed for mild constipation. 100 capsule 0   EPINEPHrine  0.3 mg/0.3 mL IJ SOAJ injection Inject into the muscle.     FLUoxetine (PROZAC) 20 MG capsule Take 20 mg by mouth every morning.     Multiple Vitamin (MULTI-VITAMIN) tablet Take by mouth.     Omega-3 Fatty Acids (FISH OIL) 300 MG CAPS Take by mouth.     oxyCODONE  (ROXICODONE ) 5 MG immediate release tablet Take 1 tablet (5 mg total) by mouth every 4 (four) hours as needed for severe pain (pain score 7-10) or breakthrough pain. 10 tablet 0   polyethylene glycol powder (GLYCOLAX /MIRALAX ) 17 GM/SCOOP powder Take 17 g by mouth 2 (two) times daily as needed. 3350 g 1   triamterene -hydrochlorothiazide  (DYAZIDE) 37.5-25 MG capsule TAKE 1 EACH (1 CAPSULE TOTAL) BY MOUTH DAILY. 90 capsule 1   ZEPBOUND 2.5 MG/0.5ML Pen Inject 2.5 mg into the skin once a week.     No current facility-administered medications for this visit.   No results found.  Review of Systems:   A ROS was performed including pertinent positives and negatives as documented in the HPI.   Musculoskeletal Exam:    There were no vitals taken for this visit.  Left hip incisions are well-appearing without erythema or drainage.  30 degrees internal rotation external rotation of the hip without pain.  Walks with mildly antalgic gait.  Distal neurosensory exam is intact  Imaging:      I personally reviewed and interpreted the radiographs.   Assessment:   6 weeks status post left hip arthroscopic labral repair and cam debridement doing well.  Unfortunately he did have a bit of a setback and as result I have placed him on a Medrol  Dosepak.  He will continue physical therapy although I do believe he will recover from this minor setback  Plan :    - Return for assessment in 6 weeks      I personally saw and evaluated the patient, and participated in the management and treatment plan.  Elspeth Parker, MD Attending Physician, Orthopedic Surgery  This  document was dictated using Dragon voice recognition software. A reasonable attempt at proof reading has been made to minimize errors.

## 2023-10-22 ENCOUNTER — Encounter (HOSPITAL_BASED_OUTPATIENT_CLINIC_OR_DEPARTMENT_OTHER): Payer: Self-pay | Admitting: Physical Therapy

## 2023-10-22 ENCOUNTER — Ambulatory Visit (HOSPITAL_BASED_OUTPATIENT_CLINIC_OR_DEPARTMENT_OTHER): Payer: Self-pay | Admitting: Physical Therapy

## 2023-10-22 DIAGNOSIS — M25552 Pain in left hip: Secondary | ICD-10-CM | POA: Diagnosis not present

## 2023-10-22 DIAGNOSIS — M6281 Muscle weakness (generalized): Secondary | ICD-10-CM

## 2023-10-22 DIAGNOSIS — R29898 Other symptoms and signs involving the musculoskeletal system: Secondary | ICD-10-CM

## 2023-10-22 DIAGNOSIS — R2689 Other abnormalities of gait and mobility: Secondary | ICD-10-CM

## 2023-10-22 DIAGNOSIS — M5459 Other low back pain: Secondary | ICD-10-CM | POA: Diagnosis not present

## 2023-10-22 NOTE — Therapy (Signed)
 OUTPATIENT PHYSICAL THERAPY TREATMENT   Patient Name: Dominic Joseph MRN: [id number] DOB:May 29, 1980, 43 y.o., male Today's Date: 10/22/2023  END OF SESSION:  PT End of Session - 10/22/23 1518     Visit Number 10    Number of Visits 24    Date for PT Re-Evaluation 11/28/23    Authorization Type [employer]    PT Start Time 1520    PT Stop Time 1600    PT Time Calculation (min) 40 min    Activity Tolerance Patient tolerated treatment well    Behavior During Therapy WFL for tasks assessed/performed            Past Medical History:  Diagnosis Date   Depression    Environmental allergies    Hyperlipidemia    Hypertension    Sarcoidosis of lung (HCC)    Syncope and collapse    Past Surgical History:  Procedure Laterality Date   LUNG BIOPSY Left    SHOULDER SURGERY Right    SHOULDER SURGERY Left 11/27/2021   WISDOM TOOTH EXTRACTION     Patient Active Problem List   Diagnosis Date Noted   Neutropenia (HCC) 08/15/2022   Family history of prostate cancer 06/09/2022   Healthcare maintenance 06/09/2022   Visit for suture removal 05/20/2022   Slow transit constipation 05/20/2022   Benign prostatic hyperplasia with urinary hesitancy 05/20/2022   Syncope and collapse    Cluster headaches 07/07/2019   Essential hypertension 07/07/2019   Fatigue 07/07/2019   ADD (attention deficit disorder) 07/07/2019   Severe depression (HCC) 07/07/2019   IBS (irritable colon syndrome) 07/07/2019   Elevated serum creatinine 07/07/2019   Chronic pansinusitis 11/30/2018   Perennial allergic rhinitis 11/30/2018   Tobacco use disorder 01/29/2012   Environmental allergies 01/28/2012   Plantar fascial fibromatosis 12/23/2011   Asthma 07/25/2011   Sarcoidosis 07/25/2011   Pain in joint involving lower leg 06/10/2011   Primary localized osteoarthrosis, lower leg 06/10/2011    PCP: Berneta Elsie Sayre, MD  REFERRING PROVIDER: Genelle Elspeth, MD  REFERRING DIAG: [phone] (ICD-10-CM) -  Tear of left acetabular labrum, initial encounter; L hip arthroscopy with labral repair DOS 09/03/23  THERAPY DIAG:  Pain in left hip  Muscle weakness (generalized)  Other abnormalities of gait and mobility  Other symptoms and signs involving the musculoskeletal system  Rationale for Evaluation and Treatment: Rehabilitation  ONSET DATE:  DOS 09/03/23  SUBJECTIVE:   SUBJECTIVE STATEMENT: Patient states still sore from family member falling on him. Interested in DN.   EVAL: Patient states he has not been putting weight on his leg yet with walking. Sleeping in recliner is more comfortable than bed. Using crutches. Normally active: weightlifting, riding bike, etc.   PERTINENT HISTORY: HTN, HLD, sarcoidosis of lung, hx LBP PAIN:  Are you having pain? Yes: NPRS scale: 0/10 Pain location: L hip Pain description: all of the above Aggravating factors: sleeping in bed Relieving factors: rest   PRECAUTIONS: Other: L hip arthroscopy with labral repair   WEIGHT BEARING RESTRICTIONS: WBAT  FALLS:  Has patient fallen in last 6 months? No  OCCUPATION: [employer]  PLOF: Independent  PATIENT GOALS: strengthening, decrease pain, rehab from surgery  OBJECTIVE: (objective measures from initial evaluation unless otherwise dated)  Observation: sutures intact, No s/s of infection  PATIENT SURVEYS:  LEFS  Extreme difficulty/unable (0), Quite a bit of difficulty (1), Moderate difficulty (2), Little difficulty (3), No difficulty (4) Survey date:  09/05/23  Any of your usual work, housework or school activities  0  2. Usual hobbies, recreational or sporting activities 0  3. Getting into/out of the bath 0  4. Walking between rooms 1  5. Putting on socks/shoes 1  6. Squatting  1  7. Lifting an object, like a bag of groceries from the floor 1  8. Performing light activities around your home 2  9. Performing heavy activities around your home 0  10. Getting into/out of a car 1  11.  Walking 2 blocks 1  12. Walking 1 mile 0  13. Going up/down 10 stairs (1 flight) 1  14. Standing for 1 hour 1  15.  sitting for 1 hour 1  16. Running on even ground 0  17. Running on uneven ground 0  18. Making sharp turns while running fast 0  19. Hopping  0  20. Rolling over in bed 1  Score total:  12/80     COGNITION: Overall cognitive status: Within functional limits for tasks assessed     SENSATION: WFL  POSTURE: No Significant postural limitations  PALPATION: Grossly Tender L hip  LOWER EXTREMITY ROM:  Passive ROM Right eval Left eval  Hip flexion  90  Hip extension    Hip abduction  30  Hip adduction    Hip internal rotation  20  Hip external rotation  20  Knee flexion    Knee extension    Ankle dorsiflexion    Ankle plantarflexion    Ankle inversion    Ankle eversion     (Blank rows = not tested) *= pain/symptoms  LOWER EXTREMITY MMT:  MMT Right eval Left eval  Hip flexion    Hip extension    Hip abduction    Hip adduction    Hip internal rotation    Hip external rotation    Knee flexion 5 4- *  Knee extension 5 4  Ankle dorsiflexion    Ankle plantarflexion    Ankle inversion    Ankle eversion     (Blank rows = not tested) *= pain/symptoms   FUNCTIONAL TESTS:  NT due to post op status  GAIT: Distance walked: 100 feet Assistive device utilized: Crutches Level of assistance: Modified independence Comments: NWB L hip; able to ambulate with bilateral axillary crutches following cueing/education   TODAY'S TREATMENT:                                                                                                                              DATE:  10/22/23 Manual: STM to L glutes pre and post dry needling for trigger point identification and muscular relaxation. Trigger Point Dry Needling  Initial Treatment: Pt instructed on Dry Needling rational, procedures, and possible side effects. Pt instructed to expect mild to moderate muscle  soreness later in the day and/or into the next day.  Pt instructed in methods to reduce muscle soreness. Pt instructed to continue prescribed HEP. Patient was educated on signs and symptoms of infection and other risk  factors and advised to seek medical attention should they occur.  Patient verbalized understanding of these instructions and education.   Patient Verbal Consent Given: Yes Education Handout Provided: Yes Muscles Treated: L glutes, hip flexor Electrical Stimulation Performed: No Treatment Response/Outcome: twitch response, decrease in tissue tension  Supine active hamstring stretch 5 x 10 second holds Test retest with ambulation throughout Education on continued HEP, DN   10/15/2023: Upright bike 5 minutes, lvl 9 Step up 6 inch 2 x 10 Lateral step up 6 step 2 10 Bridges with SL eccentric control.  Modified thomas stretch to tolerance for hip flexor stretch Side stepping with BTB  Standing hip extension with BTB Standing hip abduction with BTB  Lap around clinic to cool down and assess gait.   10/13/2023 Upright bike 5 minutes, lvl 9 Step up 6 inch 2 x 10 Lateral step up 6 step 2 10 Standing side stepping with BTB to fatigue Retro walking with 50lb cable pull  Bridges with SL eccentric control.  Dbl leg press within 90 degree range of motion 3x10.  Modified thomas stretch to tolerance for hip flexor stretch  10/13/2023: Upright bike 5 minutes, lvl 7 Step up 6 inch 2 x 10 Lateral step up 6 step 2 10 Standing side stepping with BTB to fatigue Retro walking with 50lb cable pull  Bridges with SL eccentric control.  Dbl leg press within 90 degree range of motion 3x10.  Modified thomas stretch to tolerance for hip flexor stretch.    10/08/23 Active hamstring stretch 10 x 10 second holds Manual: STM to hip flexors Prone quad stretch 4 x 20 second holds Step up 6 inch 3 x 10 Lateral step down 4 inch 2 x 10 Standing hip abduction GTB 3 x 10 Squat 2 x 10    10/06/23 Manual: STM to glutes Prone hip extension 3# 3 x 10 Step up 4 inch 3 x 10 SLS 4 x 30 second holds with intermittent UE support Hip hinge 2  x 10  Plank on knees 3 x 20-30 second holds Gait mechanics  09/29/23 Upright bike 5 minutes Tall kneeling with shoulder extension BTB 3 x 10 Tall kneeling hip hinge 20# 3 x 10 1/2 kneeling lift 20# 3 x 10 Prone hip extension 3# 3 x 10 Hip circles CW/CCW 2 x 10 STS 1 x 10, with airex 2 x 10  Hip hinge 1  x 10   09/24/23 Upright bike 5 minutes  Bridge 10# 2 x 10, GTB at knees with clam 3 x 10 Sidelying clam GTB 3 x 10 Prone hip extension 3# 3 x 10 Tall kneeling hip hinge 10# 3 x 10 Tall kneeling with shoulder extension GTB 3 x 10 1/2 kneeling lift 10# 3 x 10 STS from elevated plinth 2 x 10   09/17/23 Bridge 3 x 10 Sidelying reverse clam 2 x 10 Prone hamstring curl 5# 3 x 10 Prone hip extension 3 x 10 Prone hip ER/IR small range 2 x 10 Quadruped rocking 2 x 10 Quadruped pelvic tilts 2 x 10 Quadruped shoulder flexion 2 x 10 Tall kneeling hip hinge 2 x 10 Gait mechanics with glute set with SPC Manual: STM to hip flexors/adductors  09/10/23 Gait mechanics with glute set with SPC Bent knee fall outs 2 x 10 Sidelying clam 3 x 10 Prone hamstring curl 3# 2 x 10 Prone hip ER/IR small range 2 x 10  Quadruped rocking 2 x 10 Quadruped pelvic tilts 2 x 10 Dressing change  09/05/23 Eval, HEP, Education, dressing change, gait mechanics   PATIENT EDUCATION:  Education details: Patient educated on exam findings, POC, scope of PT, HEP, protocol/precaution, gait mechanics, and dressing change. 09/10/23: HEP, proper activity levels/progressions 10/22/23: HEP, DN Person educated: Patient Education method: Explanation, Demonstration, and Handouts Education comprehension: verbalized understanding, returned demonstration, verbal cues required, and tactile cues required  HOME EXERCISE PROGRAM: Access Code: [email or web address] URL:  [email or web address] Date: 09/05/2023 Prepared by: Prentice Ligaya Cormier  Exercises - Gluteal Sets (Mirrored)  - 3 x daily - 7 x weekly - 2 sets - 10 reps - 5-10 second hold - Supine Quadricep Sets (Mirrored)  - 3 x daily - 7 x weekly - 2 sets - 10 reps - 5-10 second hold - Clamshell (Mirrored)  - 3 x daily - 7 x weekly - 2 sets - 10 reps - Bent Knee Fallouts (Mirrored)  - 3 x daily - 7 x weekly - 2 sets - 10 reps  ASSESSMENT:  CLINICAL IMPRESSION: Patient interested in DN, educated on DN and performed  to glutes and hip flexor along with manual with decrease in tissue tension. Improvement in gait following DN and stretches. Educated on HEP.  Patient will continue to benefit from physical therapy in order to improve function and reduce impairment.   OBJECTIVE IMPAIRMENTS: Abnormal gait, decreased activity tolerance, decreased balance, decreased endurance, decreased mobility, difficulty walking, decreased ROM, decreased strength, increased muscle spasms, impaired flexibility, improper body mechanics, and pain.   ACTIVITY LIMITATIONS: carrying, lifting, bending, standing, squatting, sleeping, stairs, transfers, bed mobility, bathing, toileting, dressing, hygiene/grooming, locomotion level, and caring for others  PARTICIPATION LIMITATIONS: meal prep, cleaning, laundry, driving, shopping, community activity, occupation, and yard work  PERSONAL FACTORS: Time since onset of injury/illness/exacerbation and 3+ comorbidities: HTN, HLD, sarcoidosis of lung are also affecting patient's functional outcome.   REHAB POTENTIAL: Good  CLINICAL DECISION MAKING: Stable/uncomplicated  EVALUATION COMPLEXITY: Low   GOALS: Goals reviewed with patient? Yes  SHORT TERM GOALS: Target date: 10/03/2023    Patient will be independent with HEP in order to improve functional outcomes. Baseline: Goal status: INITIAL  2.  Patient will report at least 25% improvement in symptoms for improved quality  of life. Baseline: Goal status: INITIAL  3.  SLS 30s without compensation Baseline:  Goal status: INITIAL      LONG TERM GOALS: Target date: 11/28/2023    Patient will report at least 75% improvement in symptoms for improved quality of life. Baseline:  Goal status: INITIAL  2.  Patient will improve LEFS score by at least 48 points in order to indicate improved tolerance to activity. Baseline: 12/80 Goal status: INITIAL  3.  Patient will be able to navigate stairs with reciprocal pattern without compensation in order to demonstrate improved LE strength. Baseline:  Goal status: INITIAL  4.  Patient will demonstrate MMT 90% of opposite lower extremity in all tested musculature as evidence of improved strength to assist with stair ambulation and gait. Baseline:  Goal status: INITIAL  5.  Patient will be able to return to all activities unrestricted for improved ability to perform work functions and participate with family.  Baseline:  Goal status: INITIAL    PLAN:  PT FREQUENCY: 1-2x/week  PT DURATION: 12 weeks  PLANNED INTERVENTIONS: [id number]- PT Re-evaluation, 97110-Therapeutic exercises, 97530- Therapeutic activity, [id number]- Neuromuscular re-education, 97535- Self Care, [id number]- Manual therapy, [id number]- Gait training, [phone]- Orthotic Fit/training, [id number]- Canalith repositioning, [id number]- Aquatic Therapy, 97760- Splinting, [id number]- Wound care (first 20 sq cm), 02401- Wound care (each  additional 20 sq cm)Patient/Family education, Balance training, Stair training, Taping, Dry Needling, Joint mobilization, Joint manipulation, Spinal manipulation, Spinal mobilization, Scar mobilization, and DME instructions.  PLAN FOR NEXT SESSION: progress with hip labral repair protocol    Prentice GORMAN Stains, PT, DPT 10/22/2023, 4:03 PM

## 2023-10-26 ENCOUNTER — Ambulatory Visit (HOSPITAL_BASED_OUTPATIENT_CLINIC_OR_DEPARTMENT_OTHER): Payer: Self-pay | Attending: Orthopaedic Surgery | Admitting: Physical Therapy

## 2023-10-26 ENCOUNTER — Other Ambulatory Visit (HOSPITAL_BASED_OUTPATIENT_CLINIC_OR_DEPARTMENT_OTHER): Payer: Self-pay

## 2023-10-26 ENCOUNTER — Encounter (HOSPITAL_BASED_OUTPATIENT_CLINIC_OR_DEPARTMENT_OTHER): Payer: Self-pay | Admitting: Physical Therapy

## 2023-10-26 DIAGNOSIS — F411 Generalized anxiety disorder: Secondary | ICD-10-CM | POA: Diagnosis not present

## 2023-10-26 DIAGNOSIS — M6281 Muscle weakness (generalized): Secondary | ICD-10-CM | POA: Insufficient documentation

## 2023-10-26 DIAGNOSIS — M5459 Other low back pain: Secondary | ICD-10-CM | POA: Diagnosis not present

## 2023-10-26 DIAGNOSIS — R2689 Other abnormalities of gait and mobility: Secondary | ICD-10-CM | POA: Diagnosis not present

## 2023-10-26 DIAGNOSIS — M25552 Pain in left hip: Secondary | ICD-10-CM | POA: Diagnosis not present

## 2023-10-26 DIAGNOSIS — F332 Major depressive disorder, recurrent severe without psychotic features: Secondary | ICD-10-CM | POA: Diagnosis not present

## 2023-10-26 DIAGNOSIS — F4312 Post-traumatic stress disorder, chronic: Secondary | ICD-10-CM | POA: Diagnosis not present

## 2023-10-26 DIAGNOSIS — R29898 Other symptoms and signs involving the musculoskeletal system: Secondary | ICD-10-CM | POA: Insufficient documentation

## 2023-10-26 MED ORDER — TRAZODONE HCL 50 MG PO TABS
25.0000 mg | ORAL_TABLET | Freq: Every day | ORAL | 1 refills | Status: AC | PRN
  Filled 2023-10-26: qty 30, 60d supply, fill #0

## 2023-10-26 NOTE — Therapy (Signed)
 OUTPATIENT PHYSICAL THERAPY TREATMENT   Patient Name: Dominic Joseph MRN: [id number] DOB:11-Jul-1980, 43 y.o., male Today's Date: 10/26/2023  END OF SESSION:  PT End of Session - 10/26/23 0716     Visit Number 11    Number of Visits 24    Date for PT Re-Evaluation 11/28/23    Authorization Type [employer]    PT Start Time 0714    PT Stop Time 0754    PT Time Calculation (min) 40 min    Activity Tolerance Patient tolerated treatment well    Behavior During Therapy [facility] for tasks assessed/performed             Past Medical History:  Diagnosis Date   Depression    Environmental allergies    Hyperlipidemia    Hypertension    Sarcoidosis of lung (HCC)    Syncope and collapse    Past Surgical History:  Procedure Laterality Date   LUNG BIOPSY Left    SHOULDER SURGERY Right    SHOULDER SURGERY Left 11/27/2021   WISDOM TOOTH EXTRACTION     Patient Active Problem List   Diagnosis Date Noted   Neutropenia (HCC) 08/15/2022   Family history of prostate cancer 06/09/2022   Healthcare maintenance 06/09/2022   Visit for suture removal 05/20/2022   Slow transit constipation 05/20/2022   Benign prostatic hyperplasia with urinary hesitancy 05/20/2022   Syncope and collapse    Cluster headaches 07/07/2019   Essential hypertension 07/07/2019   Fatigue 07/07/2019   ADD (attention deficit disorder) 07/07/2019   Severe depression (HCC) 07/07/2019   IBS (irritable colon syndrome) 07/07/2019   Elevated serum creatinine 07/07/2019   Chronic pansinusitis 11/30/2018   Perennial allergic rhinitis 11/30/2018   Tobacco use disorder 01/29/2012   Environmental allergies 01/28/2012   Plantar fascial fibromatosis 12/23/2011   Asthma 07/25/2011   Sarcoidosis 07/25/2011   Pain in joint involving lower leg 06/10/2011   Primary localized osteoarthrosis, lower leg 06/10/2011    PCP: Berneta Elsie Sayre, MD  REFERRING PROVIDER: Genelle Elspeth, MD  REFERRING DIAG: [phone] (ICD-10-CM) -  Tear of left acetabular labrum, initial encounter; L hip arthroscopy with labral repair DOS 09/03/23  THERAPY DIAG:  Pain in left hip  Muscle weakness (generalized)  Other abnormalities of gait and mobility  Other low back pain  Other symptoms and signs involving the musculoskeletal system  Rationale for Evaluation and Treatment: Rehabilitation  ONSET DATE:  DOS 09/03/23  SUBJECTIVE:   SUBJECTIVE STATEMENT: Patient states that he is still recovering from family incident. He states that he is stiff.    EVAL: Patient states he has not been putting weight on his leg yet with walking. Sleeping in recliner is more comfortable than bed. Using crutches. Normally active: weightlifting, riding bike, etc.   PERTINENT HISTORY: HTN, HLD, sarcoidosis of lung, hx LBP PAIN:  Are you having pain? Yes: NPRS scale: 0/10 Pain location: L hip Pain description: all of the above Aggravating factors: sleeping in bed Relieving factors: rest   PRECAUTIONS: Other: L hip arthroscopy with labral repair   WEIGHT BEARING RESTRICTIONS: WBAT  FALLS:  Has patient fallen in last 6 months? No  OCCUPATION: [employer]  PLOF: Independent  PATIENT GOALS: strengthening, decrease pain, rehab from surgery  OBJECTIVE: (objective measures from initial evaluation unless otherwise dated)  Observation: sutures intact, No s/s of infection  PATIENT SURVEYS:  LEFS  Extreme difficulty/unable (0), Quite a bit of difficulty (1), Moderate difficulty (2), Little difficulty (3), No difficulty (4) Survey date:  09/05/23  Any of your usual work, housework or school activities 0  2. Usual hobbies, recreational or sporting activities 0  3. Getting into/out of the bath 0  4. Walking between rooms 1  5. Putting on socks/shoes 1  6. Squatting  1  7. Lifting an object, like a bag of groceries from the floor 1  8. Performing light activities around your home 2  9. Performing heavy activities around your home 0  10.  Getting into/out of a car 1  11. Walking 2 blocks 1  12. Walking 1 mile 0  13. Going up/down 10 stairs (1 flight) 1  14. Standing for 1 hour 1  15.  sitting for 1 hour 1  16. Running on even ground 0  17. Running on uneven ground 0  18. Making sharp turns while running fast 0  19. Hopping  0  20. Rolling over in bed 1  Score total:  12/80     COGNITION: Overall cognitive status: Within functional limits for tasks assessed     SENSATION: WFL  POSTURE: No Significant postural limitations  PALPATION: Grossly Tender L hip  LOWER EXTREMITY ROM:  Passive ROM Right eval Left eval  Hip flexion  90  Hip extension    Hip abduction  30  Hip adduction    Hip internal rotation  20  Hip external rotation  20  Knee flexion    Knee extension    Ankle dorsiflexion    Ankle plantarflexion    Ankle inversion    Ankle eversion     (Blank rows = not tested) *= pain/symptoms  LOWER EXTREMITY MMT:  MMT Right eval Left eval  Hip flexion    Hip extension    Hip abduction    Hip adduction    Hip internal rotation    Hip external rotation    Knee flexion 5 4- *  Knee extension 5 4  Ankle dorsiflexion    Ankle plantarflexion    Ankle inversion    Ankle eversion     (Blank rows = not tested) *= pain/symptoms   FUNCTIONAL TESTS:  NT due to post op status  GAIT: Distance walked: 100 feet Assistive device utilized: Crutches Level of assistance: Modified independence Comments: NWB L hip; able to ambulate with bilateral axillary crutches following cueing/education   TODAY'S TREATMENT:                                                                                                                              DATE:  10/26/23: Upright bike 5 minutes, lvl 9 Seated HS stretch  Modified thomas stretch  Bridges 2x 10  Quadruped rocking  Quadruped cat/cow  Supine figure 4 stretch  STM to HS with muscle bending and pin and stretch Cross friction massage to piriformis.     10/22/23 Manual: STM to L glutes pre and post dry needling for trigger point identification and muscular relaxation. Trigger Point Dry Needling  Initial  Treatment: Pt instructed on Dry Needling rational, procedures, and possible side effects. Pt instructed to expect mild to moderate muscle soreness later in the day and/or into the next day.  Pt instructed in methods to reduce muscle soreness. Pt instructed to continue prescribed HEP. Patient was educated on signs and symptoms of infection and other risk factors and advised to seek medical attention should they occur.  Patient verbalized understanding of these instructions and education.   Patient Verbal Consent Given: Yes Education Handout Provided: Yes Muscles Treated: L glutes, hip flexor Electrical Stimulation Performed: No Treatment Response/Outcome: twitch response, decrease in tissue tension  Supine active hamstring stretch 5 x 10 second holds Test retest with ambulation throughout Education on continued HEP, DN   10/15/2023: Upright bike 5 minutes, lvl 9 Step up 6 inch 2 x 10 Lateral step up 6 step 2 10 Bridges with SL eccentric control.  Modified thomas stretch to tolerance for hip flexor stretch Side stepping with BTB  Standing hip extension with BTB Standing hip abduction with BTB  Lap around clinic to cool down and assess gait.   10/13/2023 Upright bike 5 minutes, lvl 9 Step up 6 inch 2 x 10 Lateral step up 6 step 2 10 Standing side stepping with BTB to fatigue Retro walking with 50lb cable pull  Bridges with SL eccentric control.  Dbl leg press within 90 degree range of motion 3x10.  Modified thomas stretch to tolerance for hip flexor stretch  10/13/2023: Upright bike 5 minutes, lvl 7 Step up 6 inch 2 x 10 Lateral step up 6 step 2 10 Standing side stepping with BTB to fatigue Retro walking with 50lb cable pull  Bridges with SL eccentric control.  Dbl leg press within 90 degree range of motion  3x10.  Modified thomas stretch to tolerance for hip flexor stretch.    10/08/23 Active hamstring stretch 10 x 10 second holds Manual: STM to hip flexors Prone quad stretch 4 x 20 second holds Step up 6 inch 3 x 10 Lateral step down 4 inch 2 x 10 Standing hip abduction GTB 3 x 10 Squat 2 x 10   10/06/23 Manual: STM to glutes Prone hip extension 3# 3 x 10 Step up 4 inch 3 x 10 SLS 4 x 30 second holds with intermittent UE support Hip hinge 2  x 10  Plank on knees 3 x 20-30 second holds Gait mechanics   PATIENT EDUCATION:  Education details: Patient educated on exam findings, POC, scope of PT, HEP, protocol/precaution, gait mechanics, and dressing change. 09/10/23: HEP, proper activity levels/progressions 10/22/23: HEP, DN Person educated: Patient Education method: Explanation, Demonstration, and Handouts Education comprehension: verbalized understanding, returned demonstration, verbal cues required, and tactile cues required  HOME EXERCISE PROGRAM: Access Code: [email or web address] URL: [email or web address] Date: 09/05/2023 Prepared by: Prentice Zaunegger  Exercises - Gluteal Sets (Mirrored)  - 3 x daily - 7 x weekly - 2 sets - 10 reps - 5-10 second hold - Supine Quadricep Sets (Mirrored)  - 3 x daily - 7 x weekly - 2 sets - 10 reps - 5-10 second hold - Clamshell (Mirrored)  - 3 x daily - 7 x weekly - 2 sets - 10 reps - Bent Knee Fallouts (Mirrored)  - 3 x daily - 7 x weekly - 2 sets - 10 reps  ASSESSMENT:  CLINICAL IMPRESSION: Pt continues to demonstrate an antalgic gait. He reports stiffness and is tender to palpation in gluteal/ piriformis region. Responded well to STM, stretches and gentle movement  today. Improvement in gait following manual and stretches. Recommended use of ice on glutes today. Discussed continued stretching, mobility throughout the day. Patient will continue to benefit from physical therapy in order to improve function and reduce impairment.   OBJECTIVE  IMPAIRMENTS: Abnormal gait, decreased activity tolerance, decreased balance, decreased endurance, decreased mobility, difficulty walking, decreased ROM, decreased strength, increased muscle spasms, impaired flexibility, improper body mechanics, and pain.   ACTIVITY LIMITATIONS: carrying, lifting, bending, standing, squatting, sleeping, stairs, transfers, bed mobility, bathing, toileting, dressing, hygiene/grooming, locomotion level, and caring for others  PARTICIPATION LIMITATIONS: meal prep, cleaning, laundry, driving, shopping, community activity, occupation, and yard work  PERSONAL FACTORS: Time since onset of injury/illness/exacerbation and 3+ comorbidities: HTN, HLD, sarcoidosis of lung are also affecting patient's functional outcome.   REHAB POTENTIAL: Good  CLINICAL DECISION MAKING: Stable/uncomplicated  EVALUATION COMPLEXITY: Low   GOALS: Goals reviewed with patient? Yes  SHORT TERM GOALS: Target date: 10/03/2023    Patient will be independent with HEP in order to improve functional outcomes. Baseline: Goal status: INITIAL  2.  Patient will report at least 25% improvement in symptoms for improved quality of life. Baseline: Goal status: INITIAL  3.  SLS 30s without compensation Baseline:  Goal status: INITIAL      LONG TERM GOALS: Target date: 11/28/2023    Patient will report at least 75% improvement in symptoms for improved quality of life. Baseline:  Goal status: INITIAL  2.  Patient will improve LEFS score by at least 48 points in order to indicate improved tolerance to activity. Baseline: 12/80 Goal status: INITIAL  3.  Patient will be able to navigate stairs with reciprocal pattern without compensation in order to demonstrate improved LE strength. Baseline:  Goal status: INITIAL  4.  Patient will demonstrate MMT 90% of opposite lower extremity in all tested musculature as evidence of improved strength to assist with stair ambulation and gait. Baseline:   Goal status: INITIAL  5.  Patient will be able to return to all activities unrestricted for improved ability to perform work functions and participate with family.  Baseline:  Goal status: INITIAL    PLAN:  PT FREQUENCY: 1-2x/week  PT DURATION: 12 weeks  PLANNED INTERVENTIONS: 97164- PT Re-evaluation, 97110-Therapeutic exercises, 97530- Therapeutic activity, [id number]- Neuromuscular re-education, 97535- Self Care, [id number]- Manual therapy, [id number]- Gait training, [phone]- Orthotic Fit/training, [phone]- Canalith repositioning, [id number]- Aquatic Therapy, [phone]- Splinting, [phone]- Wound care (first 20 sq cm), 97598- Wound care (each additional 20 sq cm)Patient/Family education, Balance training, Stair training, Taping, Dry Needling, Joint mobilization, Joint manipulation, Spinal manipulation, Spinal mobilization, Scar mobilization, and DME instructions.  PLAN FOR NEXT SESSION: progress with hip labral repair protocol    Rojean JONELLE Batten, PT, DPT 10/26/2023, 7:59 AM

## 2023-10-28 ENCOUNTER — Ambulatory Visit (HOSPITAL_BASED_OUTPATIENT_CLINIC_OR_DEPARTMENT_OTHER): Admitting: Physical Therapy

## 2023-10-28 ENCOUNTER — Encounter (HOSPITAL_BASED_OUTPATIENT_CLINIC_OR_DEPARTMENT_OTHER): Payer: Self-pay | Admitting: Physical Therapy

## 2023-10-28 DIAGNOSIS — R29898 Other symptoms and signs involving the musculoskeletal system: Secondary | ICD-10-CM

## 2023-10-28 DIAGNOSIS — M25552 Pain in left hip: Secondary | ICD-10-CM

## 2023-10-28 DIAGNOSIS — M6281 Muscle weakness (generalized): Secondary | ICD-10-CM

## 2023-10-28 DIAGNOSIS — M5459 Other low back pain: Secondary | ICD-10-CM | POA: Diagnosis not present

## 2023-10-28 DIAGNOSIS — R2689 Other abnormalities of gait and mobility: Secondary | ICD-10-CM

## 2023-10-28 DIAGNOSIS — E669 Obesity, unspecified: Secondary | ICD-10-CM | POA: Diagnosis not present

## 2023-10-28 NOTE — Therapy (Signed)
 OUTPATIENT PHYSICAL THERAPY TREATMENT   Patient Name: Dominic Joseph MRN: [id number] DOB:05-16-1980, 43 y.o., male Today's Date: 10/28/2023  END OF SESSION:  PT End of Session - 10/28/23 0716     Visit Number 12    Number of Visits 24    Date for PT Re-Evaluation 11/28/23    Authorization Type [employer]    PT Start Time 0715    PT Stop Time 0757    PT Time Calculation (min) 42 min    Activity Tolerance Patient tolerated treatment well    Behavior During Therapy [facility] for tasks assessed/performed             Past Medical History:  Diagnosis Date   Depression    Environmental allergies    Hyperlipidemia    Hypertension    Sarcoidosis of lung (HCC)    Syncope and collapse    Past Surgical History:  Procedure Laterality Date   LUNG BIOPSY Left    SHOULDER SURGERY Right    SHOULDER SURGERY Left 11/27/2021   WISDOM TOOTH EXTRACTION     Patient Active Problem List   Diagnosis Date Noted   Neutropenia (HCC) 08/15/2022   Family history of prostate cancer 06/09/2022   Healthcare maintenance 06/09/2022   Visit for suture removal 05/20/2022   Slow transit constipation 05/20/2022   Benign prostatic hyperplasia with urinary hesitancy 05/20/2022   Syncope and collapse    Cluster headaches 07/07/2019   Essential hypertension 07/07/2019   Fatigue 07/07/2019   ADD (attention deficit disorder) 07/07/2019   Severe depression (HCC) 07/07/2019   IBS (irritable colon syndrome) 07/07/2019   Elevated serum creatinine 07/07/2019   Chronic pansinusitis 11/30/2018   Perennial allergic rhinitis 11/30/2018   Tobacco use disorder 01/29/2012   Environmental allergies 01/28/2012   Plantar fascial fibromatosis 12/23/2011   Asthma 07/25/2011   Sarcoidosis 07/25/2011   Pain in joint involving lower leg 06/10/2011   Primary localized osteoarthrosis, lower leg 06/10/2011    PCP: Berneta Elsie Sayre, MD  REFERRING PROVIDER: Genelle Elspeth, MD  REFERRING DIAG: [phone] (ICD-10-CM) -  Tear of left acetabular labrum, initial encounter; L hip arthroscopy with labral repair DOS 09/03/23  THERAPY DIAG:  Pain in left hip  Muscle weakness (generalized)  Other abnormalities of gait and mobility  Other symptoms and signs involving the musculoskeletal system  Rationale for Evaluation and Treatment: Rehabilitation  ONSET DATE:  DOS 09/03/23  SUBJECTIVE:   SUBJECTIVE STATEMENT: Patient states he is doing better than he has been all week. At about 65% back to where he was prior to incident. HEP going well. Been walking a lot and doing air squats. Felt DN was helpful. Manual was really helpful last session.   EVAL: Patient states he has not been putting weight on his leg yet with walking. Sleeping in recliner is more comfortable than bed. Using crutches. Normally active: weightlifting, riding bike, etc.   PERTINENT HISTORY: HTN, HLD, sarcoidosis of lung, hx LBP PAIN:  Are you having pain? Yes: NPRS scale: 0/10 Pain location: L hip Pain description: all of the above Aggravating factors: sleeping in bed Relieving factors: rest   PRECAUTIONS: Other: L hip arthroscopy with labral repair   WEIGHT BEARING RESTRICTIONS: WBAT  FALLS:  Has patient fallen in last 6 months? No  OCCUPATION: [employer]  PLOF: Independent  PATIENT GOALS: strengthening, decrease pain, rehab from surgery  OBJECTIVE: (objective measures from initial evaluation unless otherwise dated)  Observation: sutures intact, No s/s of infection  PATIENT SURVEYS:  LEFS  Extreme difficulty/unable (0), Quite a bit of difficulty (1), Moderate difficulty (2), Little difficulty (3), No difficulty (4) Survey date:  09/05/23  Any of your usual work, housework or school activities 0  2. Usual hobbies, recreational or sporting activities 0  3. Getting into/out of the bath 0  4. Walking between rooms 1  5. Putting on socks/shoes 1  6. Squatting  1  7. Lifting an object, like a bag of groceries from the  floor 1  8. Performing light activities around your home 2  9. Performing heavy activities around your home 0  10. Getting into/out of a car 1  11. Walking 2 blocks 1  12. Walking 1 mile 0  13. Going up/down 10 stairs (1 flight) 1  14. Standing for 1 hour 1  15.  sitting for 1 hour 1  16. Running on even ground 0  17. Running on uneven ground 0  18. Making sharp turns while running fast 0  19. Hopping  0  20. Rolling over in bed 1  Score total:  12/80     COGNITION: Overall cognitive status: Within functional limits for tasks assessed     SENSATION: WFL  POSTURE: No Significant postural limitations  PALPATION: Grossly Tender L hip  LOWER EXTREMITY ROM:  Passive ROM Right eval Left eval  Hip flexion  90  Hip extension    Hip abduction  30  Hip adduction    Hip internal rotation  20  Hip external rotation  20  Knee flexion    Knee extension    Ankle dorsiflexion    Ankle plantarflexion    Ankle inversion    Ankle eversion     (Blank rows = not tested) *= pain/symptoms  LOWER EXTREMITY MMT:  MMT Right eval Left eval  Hip flexion    Hip extension    Hip abduction    Hip adduction    Hip internal rotation    Hip external rotation    Knee flexion 5 4- *  Knee extension 5 4  Ankle dorsiflexion    Ankle plantarflexion    Ankle inversion    Ankle eversion     (Blank rows = not tested) *= pain/symptoms   FUNCTIONAL TESTS:  NT due to post op status  GAIT: Distance walked: 100 feet Assistive device utilized: Crutches Level of assistance: Modified independence Comments: NWB L hip; able to ambulate with bilateral axillary crutches following cueing/education   TODAY'S TREATMENT:                                                                                                                              DATE:  10/28/23 Elliptical 5 minutes, level 4 for dynamic warm up and conditioning Squat 1 x 20, 15# 1 x 20 Mini squat slide out 3 way 2 x 5  Hip hinge  15# 2 x 15 Manual: STM/TPR to glutes and piriformis Shuttle DL 899# 1 x 15, SL  75# 1 x 15, sidelying 50# 1 x 15 Supine thomas stretch 2 x 30 second holds Supine figure 4 stretch 4 x 20 second holds Supine active hamstring stretch 3 x 10 second holds  10/26/23: Upright bike 5 minutes, lvl 9 Seated HS stretch  Modified thomas stretch  Bridges 2x 10  Quadruped rocking  Quadruped cat/cow  Supine figure 4 stretch  STM to HS with muscle bending and pin and stretch Cross friction massage to piriformis.    10/22/23 Manual: STM to L glutes pre and post dry needling for trigger point identification and muscular relaxation. Trigger Point Dry Needling  Initial Treatment: Pt instructed on Dry Needling rational, procedures, and possible side effects. Pt instructed to expect mild to moderate muscle soreness later in the day and/or into the next day.  Pt instructed in methods to reduce muscle soreness. Pt instructed to continue prescribed HEP. Patient was educated on signs and symptoms of infection and other risk factors and advised to seek medical attention should they occur.  Patient verbalized understanding of these instructions and education.   Patient Verbal Consent Given: Yes Education Handout Provided: Yes Muscles Treated: L glutes, hip flexor Electrical Stimulation Performed: No Treatment Response/Outcome: twitch response, decrease in tissue tension  Supine active hamstring stretch 5 x 10 second holds Test retest with ambulation throughout Education on continued HEP, DN   10/15/2023: Upright bike 5 minutes, lvl 9 Step up 6 inch 2 x 10 Lateral step up 6 step 2 10 Bridges with SL eccentric control.  Modified thomas stretch to tolerance for hip flexor stretch Side stepping with BTB  Standing hip extension with BTB Standing hip abduction with BTB  Lap around clinic to cool down and assess gait.   10/13/2023 Upright bike 5 minutes, lvl 9 Step up 6 inch 2 x 10 Lateral step up  6 step 2 10 Standing side stepping with BTB to fatigue Retro walking with 50lb cable pull  Bridges with SL eccentric control.  Dbl leg press within 90 degree range of motion 3x10.  Modified thomas stretch to tolerance for hip flexor stretch  10/13/2023: Upright bike 5 minutes, lvl 7 Step up 6 inch 2 x 10 Lateral step up 6 step 2 10 Standing side stepping with BTB to fatigue Retro walking with 50lb cable pull  Bridges with SL eccentric control.  Dbl leg press within 90 degree range of motion 3x10.  Modified thomas stretch to tolerance for hip flexor stretch.    10/08/23 Active hamstring stretch 10 x 10 second holds Manual: STM to hip flexors Prone quad stretch 4 x 20 second holds Step up 6 inch 3 x 10 Lateral step down 4 inch 2 x 10 Standing hip abduction GTB 3 x 10 Squat 2 x 10   10/06/23 Manual: STM to glutes Prone hip extension 3# 3 x 10 Step up 4 inch 3 x 10 SLS 4 x 30 second holds with intermittent UE support Hip hinge 2  x 10  Plank on knees 3 x 20-30 second holds Gait mechanics   PATIENT EDUCATION:  Education details: Patient educated on exam findings, POC, scope of PT, HEP, protocol/precaution, gait mechanics, and dressing change. 09/10/23: HEP, proper activity levels/progressions 10/22/23: HEP, DN Person educated: Patient Education method: Explanation, Demonstration, and Handouts Education comprehension: verbalized understanding, returned demonstration, verbal cues required, and tactile cues required  HOME EXERCISE PROGRAM: Access Code: [email or web address] URL: [email or web address] Date: 09/05/2023 Prepared by: Prentice Abhay Godbolt  Exercises - Gluteal Sets (Mirrored)  - 3 x daily -  7 x weekly - 2 sets - 10 reps - 5-10 second hold - Supine Quadricep Sets (Mirrored)  - 3 x daily - 7 x weekly - 2 sets - 10 reps - 5-10 second hold - Clamshell (Mirrored)  - 3 x daily - 7 x weekly - 2 sets - 10 reps - Bent Knee Fallouts (Mirrored)  - 3 x daily - 7 x weekly - 2  sets - 10 reps  ASSESSMENT:  CLINICAL IMPRESSION: Improving symptoms and gait although not fully recovered from recent set back. Began session on elliptical for dynamic warm up which is tolerated well. Progressed standing quad and glute strengthening today and patient completed with good mechanics following initial cueing. Manual for hyperactive and tender glutes/piriformis with decrease in tissue tension following. Patient will continue to benefit from physical therapy in order to improve function and reduce impairment.   OBJECTIVE IMPAIRMENTS: Abnormal gait, decreased activity tolerance, decreased balance, decreased endurance, decreased mobility, difficulty walking, decreased ROM, decreased strength, increased muscle spasms, impaired flexibility, improper body mechanics, and pain.   ACTIVITY LIMITATIONS: carrying, lifting, bending, standing, squatting, sleeping, stairs, transfers, bed mobility, bathing, toileting, dressing, hygiene/grooming, locomotion level, and caring for others  PARTICIPATION LIMITATIONS: meal prep, cleaning, laundry, driving, shopping, community activity, occupation, and yard work  PERSONAL FACTORS: Time since onset of injury/illness/exacerbation and 3+ comorbidities: HTN, HLD, sarcoidosis of lung are also affecting patient's functional outcome.   REHAB POTENTIAL: Good  CLINICAL DECISION MAKING: Stable/uncomplicated  EVALUATION COMPLEXITY: Low   GOALS: Goals reviewed with patient? Yes  SHORT TERM GOALS: Target date: 10/03/2023    Patient will be independent with HEP in order to improve functional outcomes. Baseline: Goal status: INITIAL  2.  Patient will report at least 25% improvement in symptoms for improved quality of life. Baseline: Goal status: INITIAL  3.  SLS 30s without compensation Baseline:  Goal status: INITIAL      LONG TERM GOALS: Target date: 11/28/2023    Patient will report at least 75% improvement in symptoms for improved quality of  life. Baseline:  Goal status: INITIAL  2.  Patient will improve LEFS score by at least 48 points in order to indicate improved tolerance to activity. Baseline: 12/80 Goal status: INITIAL  3.  Patient will be able to navigate stairs with reciprocal pattern without compensation in order to demonstrate improved LE strength. Baseline:  Goal status: INITIAL  4.  Patient will demonstrate MMT 90% of opposite lower extremity in all tested musculature as evidence of improved strength to assist with stair ambulation and gait. Baseline:  Goal status: INITIAL  5.  Patient will be able to return to all activities unrestricted for improved ability to perform work functions and participate with family.  Baseline:  Goal status: INITIAL    PLAN:  PT FREQUENCY: 1-2x/week  PT DURATION: 12 weeks  PLANNED INTERVENTIONS: [id number]- PT Re-evaluation, 97110-Therapeutic exercises, 97530- Therapeutic activity, [id number]- Neuromuscular re-education, 97535- Self Care, [id number]- Manual therapy, [id number]- Gait training, [phone]- Orthotic Fit/training, [phone]- Canalith repositioning, [id number]- Aquatic Therapy, [phone]- Splinting, [phone]- Wound care (first 20 sq cm), 97598- Wound care (each additional 20 sq cm)Patient/Family education, Balance training, Stair training, Taping, Dry Needling, Joint mobilization, Joint manipulation, Spinal manipulation, Spinal mobilization, Scar mobilization, and DME instructions.  PLAN FOR NEXT SESSION: progress with hip labral repair protocol    Prentice GORMAN Stains, PT, DPT 10/28/2023, 7:54 AM

## 2023-11-04 ENCOUNTER — Ambulatory Visit (HOSPITAL_BASED_OUTPATIENT_CLINIC_OR_DEPARTMENT_OTHER): Admitting: Physical Therapy

## 2023-11-04 ENCOUNTER — Encounter (HOSPITAL_BASED_OUTPATIENT_CLINIC_OR_DEPARTMENT_OTHER): Payer: Self-pay | Admitting: Physical Therapy

## 2023-11-04 DIAGNOSIS — R2689 Other abnormalities of gait and mobility: Secondary | ICD-10-CM

## 2023-11-04 DIAGNOSIS — R29898 Other symptoms and signs involving the musculoskeletal system: Secondary | ICD-10-CM

## 2023-11-04 DIAGNOSIS — M6281 Muscle weakness (generalized): Secondary | ICD-10-CM

## 2023-11-04 DIAGNOSIS — M25552 Pain in left hip: Secondary | ICD-10-CM

## 2023-11-04 DIAGNOSIS — M5459 Other low back pain: Secondary | ICD-10-CM | POA: Diagnosis not present

## 2023-11-04 NOTE — Therapy (Signed)
 OUTPATIENT PHYSICAL THERAPY TREATMENT   Patient Name: Dominic Joseph MRN: [id number] DOB:1981/01/19, 43 y.o., male Today's Date: 11/04/2023  END OF SESSION:  PT End of Session - 11/04/23 1433     Visit Number 13    Number of Visits 24    Date for PT Re-Evaluation 11/28/23    Authorization Type [employer]    PT Start Time 1433    PT Stop Time 1512    PT Time Calculation (min) 39 min    Activity Tolerance Patient tolerated treatment well    Behavior During Therapy WFL for tasks assessed/performed             Past Medical History:  Diagnosis Date   Depression    Environmental allergies    Hyperlipidemia    Hypertension    Sarcoidosis of lung (HCC)    Syncope and collapse    Past Surgical History:  Procedure Laterality Date   LUNG BIOPSY Left    SHOULDER SURGERY Right    SHOULDER SURGERY Left 11/27/2021   WISDOM TOOTH EXTRACTION     Patient Active Problem List   Diagnosis Date Noted   Neutropenia (HCC) 08/15/2022   Family history of prostate cancer 06/09/2022   Healthcare maintenance 06/09/2022   Visit for suture removal 05/20/2022   Slow transit constipation 05/20/2022   Benign prostatic hyperplasia with urinary hesitancy 05/20/2022   Syncope and collapse    Cluster headaches 07/07/2019   Essential hypertension 07/07/2019   Fatigue 07/07/2019   ADD (attention deficit disorder) 07/07/2019   Severe depression (HCC) 07/07/2019   IBS (irritable colon syndrome) 07/07/2019   Elevated serum creatinine 07/07/2019   Chronic pansinusitis 11/30/2018   Perennial allergic rhinitis 11/30/2018   Tobacco use disorder 01/29/2012   Environmental allergies 01/28/2012   Plantar fascial fibromatosis 12/23/2011   Asthma 07/25/2011   Sarcoidosis 07/25/2011   Pain in joint involving lower leg 06/10/2011   Primary localized osteoarthrosis, lower leg 06/10/2011    PCP: Berneta Elsie Sayre, MD  REFERRING PROVIDER: Genelle Elspeth, MD  REFERRING DIAG: [phone] (ICD-10-CM) -  Tear of left acetabular labrum, initial encounter; L hip arthroscopy with labral repair DOS 09/03/23  THERAPY DIAG:  Pain in left hip  Muscle weakness (generalized)  Other abnormalities of gait and mobility  Other symptoms and signs involving the musculoskeletal system  Rationale for Evaluation and Treatment: Rehabilitation  ONSET DATE:  DOS 09/03/23  SUBJECTIVE:   SUBJECTIVE STATEMENT: Patient states he is doing better than he has been all week. At about 65% back to where he was prior to incident. HEP going well. Been walking a lot and doing air squats. Felt DN was helpful. Manual was really helpful last session.   EVAL: Patient states he has not been putting weight on his leg yet with walking. Sleeping in recliner is more comfortable than bed. Using crutches. Normally active: weightlifting, riding bike, etc.   PERTINENT HISTORY: HTN, HLD, sarcoidosis of lung, hx LBP PAIN:  Are you having pain? Yes: NPRS scale: 0/10 Pain location: L hip Pain description: all of the above Aggravating factors: sleeping in bed Relieving factors: rest   PRECAUTIONS: Other: L hip arthroscopy with labral repair   WEIGHT BEARING RESTRICTIONS: WBAT  FALLS:  Has patient fallen in last 6 months? No  OCCUPATION: [employer]  PLOF: Independent  PATIENT GOALS: strengthening, decrease pain, rehab from surgery  OBJECTIVE: (objective measures from initial evaluation unless otherwise dated)  Observation: sutures intact, No s/s of infection  PATIENT SURVEYS:  LEFS  Extreme difficulty/unable (0), Quite a bit of difficulty (1), Moderate difficulty (2), Little difficulty (3), No difficulty (4) Survey date:  09/05/23  Any of your usual work, housework or school activities 0  2. Usual hobbies, recreational or sporting activities 0  3. Getting into/out of the bath 0  4. Walking between rooms 1  5. Putting on socks/shoes 1  6. Squatting  1  7. Lifting an object, like a bag of groceries from the  floor 1  8. Performing light activities around your home 2  9. Performing heavy activities around your home 0  10. Getting into/out of a car 1  11. Walking 2 blocks 1  12. Walking 1 mile 0  13. Going up/down 10 stairs (1 flight) 1  14. Standing for 1 hour 1  15.  sitting for 1 hour 1  16. Running on even ground 0  17. Running on uneven ground 0  18. Making sharp turns while running fast 0  19. Hopping  0  20. Rolling over in bed 1  Score total:  12/80     COGNITION: Overall cognitive status: Within functional limits for tasks assessed     SENSATION: WFL  POSTURE: No Significant postural limitations  PALPATION: Grossly Tender L hip  LOWER EXTREMITY ROM:  Passive ROM Right eval Left eval  Hip flexion  90  Hip extension    Hip abduction  30  Hip adduction    Hip internal rotation  20  Hip external rotation  20  Knee flexion    Knee extension    Ankle dorsiflexion    Ankle plantarflexion    Ankle inversion    Ankle eversion     (Blank rows = not tested) *= pain/symptoms  LOWER EXTREMITY MMT:  MMT Right eval Left eval  Hip flexion    Hip extension    Hip abduction    Hip adduction    Hip internal rotation    Hip external rotation    Knee flexion 5 4- *  Knee extension 5 4  Ankle dorsiflexion    Ankle plantarflexion    Ankle inversion    Ankle eversion     (Blank rows = not tested) *= pain/symptoms   FUNCTIONAL TESTS:  NT due to post op status  GAIT: Distance walked: 100 feet Assistive device utilized: Crutches Level of assistance: Modified independence Comments: NWB L hip; able to ambulate with bilateral axillary crutches following cueing/education   TODAY'S TREATMENT:                                                                                                                              DATE:  11/04/23 Elliptical 5 minutes, level 4 for dynamic warm up and conditioning STS with LLE bias 15# 2 x 10 Hip hinge 15# 1 x 15 SL hip hinge 3 x  8 Mini squat slide out 3 way 2 x 5  Manual: STM/TPR hip flexors, TFL Education on  self STM  10/28/23 Elliptical 5 minutes, level 4 for dynamic warm up and conditioning Squat 1 x 20, 15# 1 x 20 Mini squat slide out 3 way 2 x 5  Hip hinge 15# 2 x 15 Manual: STM/TPR to glutes and piriformis Shuttle DL 899# 1 x 15, SL 24# 1 x 15, sidelying 50# 1 x 15 Supine thomas stretch 2 x 30 second holds Supine figure 4 stretch 4 x 20 second holds Supine active hamstring stretch 3 x 10 second holds  10/26/23: Upright bike 5 minutes, lvl 9 Seated HS stretch  Modified thomas stretch  Bridges 2x 10  Quadruped rocking  Quadruped cat/cow  Supine figure 4 stretch  STM to HS with muscle bending and pin and stretch Cross friction massage to piriformis.    10/22/23 Manual: STM to L glutes pre and post dry needling for trigger point identification and muscular relaxation. Trigger Point Dry Needling  Initial Treatment: Pt instructed on Dry Needling rational, procedures, and possible side effects. Pt instructed to expect mild to moderate muscle soreness later in the day and/or into the next day.  Pt instructed in methods to reduce muscle soreness. Pt instructed to continue prescribed HEP. Patient was educated on signs and symptoms of infection and other risk factors and advised to seek medical attention should they occur.  Patient verbalized understanding of these instructions and education.   Patient Verbal Consent Given: Yes Education Handout Provided: Yes Muscles Treated: L glutes, hip flexor Electrical Stimulation Performed: No Treatment Response/Outcome: twitch response, decrease in tissue tension  Supine active hamstring stretch 5 x 10 second holds Test retest with ambulation throughout Education on continued HEP, DN   10/15/2023: Upright bike 5 minutes, lvl 9 Step up 6 inch 2 x 10 Lateral step up 6 step 2 10 Bridges with SL eccentric control.  Modified thomas stretch to tolerance for  hip flexor stretch Side stepping with BTB  Standing hip extension with BTB Standing hip abduction with BTB  Lap around clinic to cool down and assess gait.   10/13/2023 Upright bike 5 minutes, lvl 9 Step up 6 inch 2 x 10 Lateral step up 6 step 2 10 Standing side stepping with BTB to fatigue Retro walking with 50lb cable pull  Bridges with SL eccentric control.  Dbl leg press within 90 degree range of motion 3x10.  Modified thomas stretch to tolerance for hip flexor stretch  10/13/2023: Upright bike 5 minutes, lvl 7 Step up 6 inch 2 x 10 Lateral step up 6 step 2 10 Standing side stepping with BTB to fatigue Retro walking with 50lb cable pull  Bridges with SL eccentric control.  Dbl leg press within 90 degree range of motion 3x10.  Modified thomas stretch to tolerance for hip flexor stretch.    10/08/23 Active hamstring stretch 10 x 10 second holds Manual: STM to hip flexors Prone quad stretch 4 x 20 second holds Step up 6 inch 3 x 10 Lateral step down 4 inch 2 x 10 Standing hip abduction GTB 3 x 10 Squat 2 x 10   10/06/23 Manual: STM to glutes Prone hip extension 3# 3 x 10 Step up 4 inch 3 x 10 SLS 4 x 30 second holds with intermittent UE support Hip hinge 2  x 10  Plank on knees 3 x 20-30 second holds Gait mechanics   PATIENT EDUCATION:  Education details: Patient educated on exam findings, POC, scope of PT, HEP, protocol/precaution, gait mechanics, and dressing change. 09/10/23: HEP, proper activity  levels/progressions 10/22/23: HEP, DN Person educated: Patient Education method: Explanation, Demonstration, and Handouts Education comprehension: verbalized understanding, returned demonstration, verbal cues required, and tactile cues required  HOME EXERCISE PROGRAM: Access Code: [email or web address] URL: [email or web address] Date: 09/05/2023 Prepared by: Prentice Krisy Dix  Exercises - Gluteal Sets (Mirrored)  - 3 x daily - 7 x weekly - 2 sets - 10 reps - 5-10  second hold - Supine Quadricep Sets (Mirrored)  - 3 x daily - 7 x weekly - 2 sets - 10 reps - 5-10 second hold - Clamshell (Mirrored)  - 3 x daily - 7 x weekly - 2 sets - 10 reps - Bent Knee Fallouts (Mirrored)  - 3 x daily - 7 x weekly - 2 sets - 10 reps  ASSESSMENT:  CLINICAL IMPRESSION: Patient continues to improve well with strengthening. Intermittent symptoms in glute/groin region which ease quickly and with cueing for mechanics. Hyperactive and tender hip flexors which decreases mildly with manual. Patient will continue to benefit from physical therapy in order to improve function and reduce impairment.   OBJECTIVE IMPAIRMENTS: Abnormal gait, decreased activity tolerance, decreased balance, decreased endurance, decreased mobility, difficulty walking, decreased ROM, decreased strength, increased muscle spasms, impaired flexibility, improper body mechanics, and pain.   ACTIVITY LIMITATIONS: carrying, lifting, bending, standing, squatting, sleeping, stairs, transfers, bed mobility, bathing, toileting, dressing, hygiene/grooming, locomotion level, and caring for others  PARTICIPATION LIMITATIONS: meal prep, cleaning, laundry, driving, shopping, community activity, occupation, and yard work  PERSONAL FACTORS: Time since onset of injury/illness/exacerbation and 3+ comorbidities: HTN, HLD, sarcoidosis of lung are also affecting patient's functional outcome.   REHAB POTENTIAL: Good  CLINICAL DECISION MAKING: Stable/uncomplicated  EVALUATION COMPLEXITY: Low   GOALS: Goals reviewed with patient? Yes  SHORT TERM GOALS: Target date: 10/03/2023    Patient will be independent with HEP in order to improve functional outcomes. Baseline: Goal status: INITIAL  2.  Patient will report at least 25% improvement in symptoms for improved quality of life. Baseline: Goal status: INITIAL  3.  SLS 30s without compensation Baseline:  Goal status: INITIAL      LONG TERM GOALS: Target date:  11/28/2023    Patient will report at least 75% improvement in symptoms for improved quality of life. Baseline:  Goal status: INITIAL  2.  Patient will improve LEFS score by at least 48 points in order to indicate improved tolerance to activity. Baseline: 12/80 Goal status: INITIAL  3.  Patient will be able to navigate stairs with reciprocal pattern without compensation in order to demonstrate improved LE strength. Baseline:  Goal status: INITIAL  4.  Patient will demonstrate MMT 90% of opposite lower extremity in all tested musculature as evidence of improved strength to assist with stair ambulation and gait. Baseline:  Goal status: INITIAL  5.  Patient will be able to return to all activities unrestricted for improved ability to perform work functions and participate with family.  Baseline:  Goal status: INITIAL    PLAN:  PT FREQUENCY: 1-2x/week  PT DURATION: 12 weeks  PLANNED INTERVENTIONS: [id number]- PT Re-evaluation, 97110-Therapeutic exercises, 97530- Therapeutic activity, [id number]- Neuromuscular re-education, 97535- Self Care, [id number]- Manual therapy, [id number]- Gait training, [phone]- Orthotic Fit/training, [phone]- Canalith repositioning, [id number]- Aquatic Therapy, [phone]- Splinting, [phone]- Wound care (first 20 sq cm), 97598- Wound care (each additional 20 sq cm)Patient/Family education, Balance training, Stair training, Taping, Dry Needling, Joint mobilization, Joint manipulation, Spinal manipulation, Spinal mobilization, Scar mobilization, and DME instructions.  PLAN FOR NEXT SESSION: progress with hip labral repair protocol  Prentice RAMAN Piya Mesch, PT, DPT 11/04/2023, 3:14 PM

## 2023-11-06 ENCOUNTER — Ambulatory Visit (HOSPITAL_BASED_OUTPATIENT_CLINIC_OR_DEPARTMENT_OTHER): Admitting: Physical Therapy

## 2023-11-06 ENCOUNTER — Encounter (HOSPITAL_BASED_OUTPATIENT_CLINIC_OR_DEPARTMENT_OTHER): Payer: Self-pay | Admitting: Physical Therapy

## 2023-11-06 DIAGNOSIS — M5459 Other low back pain: Secondary | ICD-10-CM | POA: Diagnosis not present

## 2023-11-06 DIAGNOSIS — R2689 Other abnormalities of gait and mobility: Secondary | ICD-10-CM | POA: Diagnosis not present

## 2023-11-06 DIAGNOSIS — M6281 Muscle weakness (generalized): Secondary | ICD-10-CM | POA: Diagnosis not present

## 2023-11-06 DIAGNOSIS — R29898 Other symptoms and signs involving the musculoskeletal system: Secondary | ICD-10-CM | POA: Diagnosis not present

## 2023-11-06 DIAGNOSIS — M25552 Pain in left hip: Secondary | ICD-10-CM | POA: Diagnosis not present

## 2023-11-06 NOTE — Therapy (Signed)
 OUTPATIENT PHYSICAL THERAPY TREATMENT   Patient Name: Dominic Joseph MRN: [id number] DOB:[birth year], 43 y.o., male Today's Date: 11/06/2023  END OF SESSION:  PT End of Session - 11/06/23 1305     Visit Number 14    Number of Visits 24    Date for PT Re-Evaluation 11/28/23    Authorization Type [employer]    PT Start Time 1300    PT Stop Time 1340    PT Time Calculation (min) 40 min    Activity Tolerance Patient tolerated treatment well    Behavior During Therapy WFL for tasks assessed/performed             Past Medical History:  Diagnosis Date   Depression    Environmental allergies    Hyperlipidemia    Hypertension    Sarcoidosis of lung (HCC)    Syncope and collapse    Past Surgical History:  Procedure Laterality Date   LUNG BIOPSY Left    SHOULDER SURGERY Right    SHOULDER SURGERY Left 11/27/2021   WISDOM TOOTH EXTRACTION     Patient Active Problem List   Diagnosis Date Noted   Neutropenia (HCC) 08/15/2022   Family history of prostate cancer 06/09/2022   Healthcare maintenance 06/09/2022   Visit for suture removal 05/20/2022   Slow transit constipation 05/20/2022   Benign prostatic hyperplasia with urinary hesitancy 05/20/2022   Syncope and collapse    Cluster headaches 07/07/2019   Essential hypertension 07/07/2019   Fatigue 07/07/2019   ADD (attention deficit disorder) 07/07/2019   Severe depression (HCC) 07/07/2019   IBS (irritable colon syndrome) 07/07/2019   Elevated serum creatinine 07/07/2019   Chronic pansinusitis 11/30/2018   Perennial allergic rhinitis 11/30/2018   Tobacco use disorder 01/29/2012   Environmental allergies 01/28/2012   Plantar fascial fibromatosis 12/23/2011   Asthma 07/25/2011   Sarcoidosis 07/25/2011   Pain in joint involving lower leg 06/10/2011   Primary localized osteoarthrosis, lower leg 06/10/2011    PCP: Berneta Elsie Sayre, MD  REFERRING PROVIDER: Genelle Elspeth, MD  REFERRING DIAG: [phone] (ICD-10-CM) -  Tear of left acetabular labrum, initial encounter; L hip arthroscopy with labral repair DOS 09/03/23  THERAPY DIAG:  Pain in left hip  Muscle weakness (generalized)  Other abnormalities of gait and mobility  Other symptoms and signs involving the musculoskeletal system  Rationale for Evaluation and Treatment: Rehabilitation  ONSET DATE:  DOS 09/03/23  SUBJECTIVE:   SUBJECTIVE STATEMENT: Patient states continued groin/hip flexor tightness. Some soreness in glutes.   EVAL: Patient states he has not been putting weight on his leg yet with walking. Sleeping in recliner is more comfortable than bed. Using crutches. Normally active: weightlifting, riding bike, etc.   PERTINENT HISTORY: HTN, HLD, sarcoidosis of lung, hx LBP PAIN:  Are you having pain? Yes: NPRS scale: 0/10 Pain location: L hip Pain description: all of the above Aggravating factors: sleeping in bed Relieving factors: rest   PRECAUTIONS: Other: L hip arthroscopy with labral repair   WEIGHT BEARING RESTRICTIONS: WBAT  FALLS:  Has patient fallen in last 6 months? No  OCCUPATION: [employer]  PLOF: Independent  PATIENT GOALS: strengthening, decrease pain, rehab from surgery  OBJECTIVE: (objective measures from initial evaluation unless otherwise dated)  Observation: sutures intact, No s/s of infection  PATIENT SURVEYS:  LEFS  Extreme difficulty/unable (0), Quite a bit of difficulty (1), Moderate difficulty (2), Little difficulty (3), No difficulty (4) Survey date:  09/05/23  Any of your usual work, housework or school activities 0  2. Usual hobbies, recreational or sporting activities 0  3. Getting into/out of the bath 0  4. Walking between rooms 1  5. Putting on socks/shoes 1  6. Squatting  1  7. Lifting an object, like a bag of groceries from the floor 1  8. Performing light activities around your home 2  9. Performing heavy activities around your home 0  10. Getting into/out of a car 1  11.  Walking 2 blocks 1  12. Walking 1 mile 0  13. Going up/down 10 stairs (1 flight) 1  14. Standing for 1 hour 1  15.  sitting for 1 hour 1  16. Running on even ground 0  17. Running on uneven ground 0  18. Making sharp turns while running fast 0  19. Hopping  0  20. Rolling over in bed 1  Score total:  12/80     COGNITION: Overall cognitive status: Within functional limits for tasks assessed     SENSATION: WFL  POSTURE: No Significant postural limitations  PALPATION: Grossly Tender L hip  LOWER EXTREMITY ROM:  Passive ROM Right eval Left eval  Hip flexion  90  Hip extension    Hip abduction  30  Hip adduction    Hip internal rotation  20  Hip external rotation  20  Knee flexion    Knee extension    Ankle dorsiflexion    Ankle plantarflexion    Ankle inversion    Ankle eversion     (Blank rows = not tested) *= pain/symptoms  LOWER EXTREMITY MMT:  MMT Right eval Left eval  Hip flexion    Hip extension    Hip abduction    Hip adduction    Hip internal rotation    Hip external rotation    Knee flexion 5 4- *  Knee extension 5 4  Ankle dorsiflexion    Ankle plantarflexion    Ankle inversion    Ankle eversion     (Blank rows = not tested) *= pain/symptoms   FUNCTIONAL TESTS:  NT due to post op status  GAIT: Distance walked: 100 feet Assistive device utilized: Crutches Level of assistance: Modified independence Comments: NWB L hip; able to ambulate with bilateral axillary crutches following cueing/education   TODAY'S TREATMENT:                                                                                                                              DATE:  11/06/23 Elliptical 5 minutes, level 4 for dynamic warm up and conditioning Manual: STM/TPR hip flexors, TFL; grade III PA glides in progressive ER and IR in prone SL hip hinge 10# 3 x 10 Standing hip flexor stretch 3 x 20-30 second holds Lunge 1 x 10  11/04/23 Elliptical 5 minutes, level 4  for dynamic warm up and conditioning STS with LLE bias 15# 2 x 10 Hip hinge 15# 1 x 15 SL hip hinge 3 x 8 Mini squat  slide out 3 way 2 x 5  Manual: STM/TPR hip flexors, TFL Education on self STM  10/28/23 Elliptical 5 minutes, level 4 for dynamic warm up and conditioning Squat 1 x 20, 15# 1 x 20 Mini squat slide out 3 way 2 x 5  Hip hinge 15# 2 x 15 Manual: STM/TPR to glutes and piriformis Shuttle DL 899# 1 x 15, SL 24# 1 x 15, sidelying 50# 1 x 15 Supine thomas stretch 2 x 30 second holds Supine figure 4 stretch 4 x 20 second holds Supine active hamstring stretch 3 x 10 second holds  10/26/23: Upright bike 5 minutes, lvl 9 Seated HS stretch  Modified thomas stretch  Bridges 2x 10  Quadruped rocking  Quadruped cat/cow  Supine figure 4 stretch  STM to HS with muscle bending and pin and stretch Cross friction massage to piriformis.    10/22/23 Manual: STM to L glutes pre and post dry needling for trigger point identification and muscular relaxation. Trigger Point Dry Needling  Initial Treatment: Pt instructed on Dry Needling rational, procedures, and possible side effects. Pt instructed to expect mild to moderate muscle soreness later in the day and/or into the next day.  Pt instructed in methods to reduce muscle soreness. Pt instructed to continue prescribed HEP. Patient was educated on signs and symptoms of infection and other risk factors and advised to seek medical attention should they occur.  Patient verbalized understanding of these instructions and education.   Patient Verbal Consent Given: Yes Education Handout Provided: Yes Muscles Treated: L glutes, hip flexor Electrical Stimulation Performed: No Treatment Response/Outcome: twitch response, decrease in tissue tension  Supine active hamstring stretch 5 x 10 second holds Test retest with ambulation throughout Education on continued HEP, DN   10/15/2023: Upright bike 5 minutes, lvl 9 Step up 6 inch 2 x  10 Lateral step up 6 step 2 10 Bridges with SL eccentric control.  Modified thomas stretch to tolerance for hip flexor stretch Side stepping with BTB  Standing hip extension with BTB Standing hip abduction with BTB  Lap around clinic to cool down and assess gait.     PATIENT EDUCATION:  Education details: Patient educated on exam findings, POC, scope of PT, HEP, protocol/precaution, gait mechanics, and dressing change. 09/10/23: HEP, proper activity levels/progressions 10/22/23: HEP, DN Person educated: Patient Education method: Explanation, Demonstration, and Handouts Education comprehension: verbalized understanding, returned demonstration, verbal cues required, and tactile cues required  HOME EXERCISE PROGRAM: Access Code: [email or web address] URL: [email or web address] Date: 09/05/2023 Prepared by: Prentice Ryann Pauli  Exercises - Gluteal Sets (Mirrored)  - 3 x daily - 7 x weekly - 2 sets - 10 reps - 5-10 second hold - Supine Quadricep Sets (Mirrored)  - 3 x daily - 7 x weekly - 2 sets - 10 reps - 5-10 second hold - Clamshell (Mirrored)  - 3 x daily - 7 x weekly - 2 sets - 10 reps - Bent Knee Fallouts (Mirrored)  - 3 x daily - 7 x weekly - 2 sets - 10 reps  ASSESSMENT:  CLINICAL IMPRESSION: Began on elliptical for dynamic warm up. Hyperactive and tender hip flexors which decreases mildly with STM/TPR. Limited IR ROM, mobs performed due to hypomobility. Further improvement in symptoms following. Continued with glute strengthening with progressions as able. Patient will continue to benefit from physical therapy in order to improve function and reduce impairment.   OBJECTIVE IMPAIRMENTS: Abnormal gait, decreased activity tolerance, decreased balance, decreased endurance, decreased mobility, difficulty walking, decreased ROM, decreased  strength, increased muscle spasms, impaired flexibility, improper body mechanics, and pain.   ACTIVITY LIMITATIONS: carrying, lifting, bending,  standing, squatting, sleeping, stairs, transfers, bed mobility, bathing, toileting, dressing, hygiene/grooming, locomotion level, and caring for others  PARTICIPATION LIMITATIONS: meal prep, cleaning, laundry, driving, shopping, community activity, occupation, and yard work  PERSONAL FACTORS: Time since onset of injury/illness/exacerbation and 3+ comorbidities: HTN, HLD, sarcoidosis of lung are also affecting patient's functional outcome.   REHAB POTENTIAL: Good  CLINICAL DECISION MAKING: Stable/uncomplicated  EVALUATION COMPLEXITY: Low   GOALS: Goals reviewed with patient? Yes  SHORT TERM GOALS: Target date: 10/03/2023    Patient will be independent with HEP in order to improve functional outcomes. Baseline: Goal status: INITIAL  2.  Patient will report at least 25% improvement in symptoms for improved quality of life. Baseline: Goal status: INITIAL  3.  SLS 30s without compensation Baseline:  Goal status: INITIAL      LONG TERM GOALS: Target date: 11/28/2023    Patient will report at least 75% improvement in symptoms for improved quality of life. Baseline:  Goal status: INITIAL  2.  Patient will improve LEFS score by at least 48 points in order to indicate improved tolerance to activity. Baseline: 12/80 Goal status: INITIAL  3.  Patient will be able to navigate stairs with reciprocal pattern without compensation in order to demonstrate improved LE strength. Baseline:  Goal status: INITIAL  4.  Patient will demonstrate MMT 90% of opposite lower extremity in all tested musculature as evidence of improved strength to assist with stair ambulation and gait. Baseline:  Goal status: INITIAL  5.  Patient will be able to return to all activities unrestricted for improved ability to perform work functions and participate with family.  Baseline:  Goal status: INITIAL    PLAN:  PT FREQUENCY: 1-2x/week  PT DURATION: 12 weeks  PLANNED INTERVENTIONS: [id number]- PT  Re-evaluation, 97110-Therapeutic exercises, 97530- Therapeutic activity, [id number]- Neuromuscular re-education, 97535- Self Care, [id number]- Manual therapy, [id number]- Gait training, [phone]- Orthotic Fit/training, [phone]- Canalith repositioning, [id number]- Aquatic Therapy, [phone]- Splinting, [phone]- Wound care (first 20 sq cm), 97598- Wound care (each additional 20 sq cm)Patient/Family education, Balance training, Stair training, Taping, Dry Needling, Joint mobilization, Joint manipulation, Spinal manipulation, Spinal mobilization, Scar mobilization, and DME instructions.  PLAN FOR NEXT SESSION: progress with hip labral repair protocol    Prentice GORMAN Stains, PT, DPT 11/06/2023, 1:06 PM

## 2023-11-11 ENCOUNTER — Encounter (HOSPITAL_BASED_OUTPATIENT_CLINIC_OR_DEPARTMENT_OTHER): Admitting: Physical Therapy

## 2023-11-13 ENCOUNTER — Encounter (HOSPITAL_BASED_OUTPATIENT_CLINIC_OR_DEPARTMENT_OTHER): Admitting: Physical Therapy

## 2023-11-18 ENCOUNTER — Encounter (HOSPITAL_BASED_OUTPATIENT_CLINIC_OR_DEPARTMENT_OTHER): Payer: Self-pay | Admitting: Physical Therapy

## 2023-11-18 ENCOUNTER — Ambulatory Visit (HOSPITAL_BASED_OUTPATIENT_CLINIC_OR_DEPARTMENT_OTHER): Admitting: Physical Therapy

## 2023-11-18 DIAGNOSIS — M25552 Pain in left hip: Secondary | ICD-10-CM | POA: Diagnosis not present

## 2023-11-18 DIAGNOSIS — R29898 Other symptoms and signs involving the musculoskeletal system: Secondary | ICD-10-CM | POA: Diagnosis not present

## 2023-11-18 DIAGNOSIS — M6281 Muscle weakness (generalized): Secondary | ICD-10-CM | POA: Diagnosis not present

## 2023-11-18 DIAGNOSIS — M5459 Other low back pain: Secondary | ICD-10-CM | POA: Diagnosis not present

## 2023-11-18 DIAGNOSIS — R2689 Other abnormalities of gait and mobility: Secondary | ICD-10-CM | POA: Diagnosis not present

## 2023-11-18 NOTE — Therapy (Signed)
 OUTPATIENT PHYSICAL THERAPY TREATMENT   Patient Name: ADARRYL GOLDAMMER MRN: [id number] DOB:1980/12/25, 43 y.o., male Today's Date: 11/18/2023  END OF SESSION:  PT End of Session - 11/18/23 1438     Visit Number 15    Number of Visits 24    Date for PT Re-Evaluation 11/28/23    Authorization Type [employer]    PT Start Time 1435    PT Stop Time 1515    PT Time Calculation (min) 40 min    Activity Tolerance Patient tolerated treatment well    Behavior During Therapy WFL for tasks assessed/performed             Past Medical History:  Diagnosis Date   Depression    Environmental allergies    Hyperlipidemia    Hypertension    Sarcoidosis of lung (HCC)    Syncope and collapse    Past Surgical History:  Procedure Laterality Date   LUNG BIOPSY Left    SHOULDER SURGERY Right    SHOULDER SURGERY Left 11/27/2021   WISDOM TOOTH EXTRACTION     Patient Active Problem List   Diagnosis Date Noted   Neutropenia (HCC) 08/15/2022   Family history of prostate cancer 06/09/2022   Healthcare maintenance 06/09/2022   Visit for suture removal 05/20/2022   Slow transit constipation 05/20/2022   Benign prostatic hyperplasia with urinary hesitancy 05/20/2022   Syncope and collapse    Cluster headaches 07/07/2019   Essential hypertension 07/07/2019   Fatigue 07/07/2019   ADD (attention deficit disorder) 07/07/2019   Severe depression (HCC) 07/07/2019   IBS (irritable colon syndrome) 07/07/2019   Elevated serum creatinine 07/07/2019   Chronic pansinusitis 11/30/2018   Perennial allergic rhinitis 11/30/2018   Tobacco use disorder 01/29/2012   Environmental allergies 01/28/2012   Plantar fascial fibromatosis 12/23/2011   Asthma 07/25/2011   Sarcoidosis 07/25/2011   Pain in joint involving lower leg 06/10/2011   Primary localized osteoarthrosis, lower leg 06/10/2011    PCP: Berneta Elsie Sayre, MD  REFERRING PROVIDER: Genelle Elspeth, MD  REFERRING DIAG: [id number] (ICD-10-CM) -  Tear of left acetabular labrum, initial encounter; L hip arthroscopy with labral repair DOS 09/03/23  THERAPY DIAG:  Pain in left hip  Muscle weakness (generalized)  Other abnormalities of gait and mobility  Other symptoms and signs involving the musculoskeletal system  Rationale for Evaluation and Treatment: Rehabilitation  ONSET DATE:  DOS 09/03/23  SUBJECTIVE:   SUBJECTIVE STATEMENT: Patient states hip is feeling good. Lacking strength and stability on L hip.   EVAL: Patient states he has not been putting weight on his leg yet with walking. Sleeping in recliner is more comfortable than bed. Using crutches. Normally active: weightlifting, riding bike, etc.   PERTINENT HISTORY: HTN, HLD, sarcoidosis of lung, hx LBP PAIN:  Are you having pain? Yes: NPRS scale: 0/10 Pain location: L hip Pain description: all of the above Aggravating factors: sleeping in bed Relieving factors: rest   PRECAUTIONS: Other: L hip arthroscopy with labral repair   WEIGHT BEARING RESTRICTIONS: WBAT  FALLS:  Has patient fallen in last 6 months? No  OCCUPATION: [employer]  PLOF: Independent  PATIENT GOALS: strengthening, decrease pain, rehab from surgery  OBJECTIVE: (objective measures from initial evaluation unless otherwise dated)  Observation: sutures intact, No s/s of infection  PATIENT SURVEYS:  LEFS  Extreme difficulty/unable (0), Quite a bit of difficulty (1), Moderate difficulty (2), Little difficulty (3), No difficulty (4) Survey date:  09/05/23  Any of your usual work, housework or school  activities 0  2. Usual hobbies, recreational or sporting activities 0  3. Getting into/out of the bath 0  4. Walking between rooms 1  5. Putting on socks/shoes 1  6. Squatting  1  7. Lifting an object, like a bag of groceries from the floor 1  8. Performing light activities around your home 2  9. Performing heavy activities around your home 0  10. Getting into/out of a car 1  11. Walking  2 blocks 1  12. Walking 1 mile 0  13. Going up/down 10 stairs (1 flight) 1  14. Standing for 1 hour 1  15.  sitting for 1 hour 1  16. Running on even ground 0  17. Running on uneven ground 0  18. Making sharp turns while running fast 0  19. Hopping  0  20. Rolling over in bed 1  Score total:  12/80     COGNITION: Overall cognitive status: Within functional limits for tasks assessed     SENSATION: WFL  POSTURE: No Significant postural limitations  PALPATION: Grossly Tender L hip  LOWER EXTREMITY ROM:  Passive ROM Right eval Left eval  Hip flexion  90  Hip extension    Hip abduction  30  Hip adduction    Hip internal rotation  20  Hip external rotation  20  Knee flexion    Knee extension    Ankle dorsiflexion    Ankle plantarflexion    Ankle inversion    Ankle eversion     (Blank rows = not tested) *= pain/symptoms  LOWER EXTREMITY MMT:  MMT Right eval Left eval  Hip flexion    Hip extension    Hip abduction    Hip adduction    Hip internal rotation    Hip external rotation    Knee flexion 5 4- *  Knee extension 5 4  Ankle dorsiflexion    Ankle plantarflexion    Ankle inversion    Ankle eversion     (Blank rows = not tested) *= pain/symptoms   FUNCTIONAL TESTS:  NT due to post op status  GAIT: Distance walked: 100 feet Assistive device utilized: Crutches Level of assistance: Modified independence Comments: NWB L hip; able to ambulate with bilateral axillary crutches following cueing/education   TODAY'S TREATMENT:                                                                                                                              DATE:  11/18/23 Elliptical 5 minutes, level 4 for dynamic warm up and conditioning SL hip hinge 15# 3 x 10 Split squat 13# kb 2x10 Curtsy lunge 13# kb 2 x 10 Barbell squat 95# 1 x 10, 135# 2 x 10 Hip flexor stretch 3 x 20 second holds Standing adductor stretch 35 x 10-15 second holds  11/06/23 Elliptical  5 minutes, level 4 for dynamic warm up and conditioning Manual: STM/TPR hip flexors, TFL; grade III PA glides in  progressive ER and IR in prone SL hip hinge 10# 3 x 10 Standing hip flexor stretch 3 x 20-30 second holds Lunge 1 x 10  11/04/23 Elliptical 5 minutes, level 4 for dynamic warm up and conditioning STS with LLE bias 15# 2 x 10 Hip hinge 15# 1 x 15 SL hip hinge 3 x 8 Mini squat slide out 3 way 2 x 5  Manual: STM/TPR hip flexors, TFL Education on self STM  10/28/23 Elliptical 5 minutes, level 4 for dynamic warm up and conditioning Squat 1 x 20, 15# 1 x 20 Mini squat slide out 3 way 2 x 5  Hip hinge 15# 2 x 15 Manual: STM/TPR to glutes and piriformis Shuttle DL 899# 1 x 15, SL 24# 1 x 15, sidelying 50# 1 x 15 Supine thomas stretch 2 x 30 second holds Supine figure 4 stretch 4 x 20 second holds Supine active hamstring stretch 3 x 10 second holds  10/26/23: Upright bike 5 minutes, lvl 9 Seated HS stretch  Modified thomas stretch  Bridges 2x 10  Quadruped rocking  Quadruped cat/cow  Supine figure 4 stretch  STM to HS with muscle bending and pin and stretch Cross friction massage to piriformis.    10/22/23 Manual: STM to L glutes pre and post dry needling for trigger point identification and muscular relaxation. Trigger Point Dry Needling  Initial Treatment: Pt instructed on Dry Needling rational, procedures, and possible side effects. Pt instructed to expect mild to moderate muscle soreness later in the day and/or into the next day.  Pt instructed in methods to reduce muscle soreness. Pt instructed to continue prescribed HEP. Patient was educated on signs and symptoms of infection and other risk factors and advised to seek medical attention should they occur.  Patient verbalized understanding of these instructions and education.   Patient Verbal Consent Given: Yes Education Handout Provided: Yes Muscles Treated: L glutes, hip flexor Electrical Stimulation Performed:  No Treatment Response/Outcome: twitch response, decrease in tissue tension  Supine active hamstring stretch 5 x 10 second holds Test retest with ambulation throughout Education on continued HEP, DN   10/15/2023: Upright bike 5 minutes, lvl 9 Step up 6 inch 2 x 10 Lateral step up 6 step 2 10 Bridges with SL eccentric control.  Modified thomas stretch to tolerance for hip flexor stretch Side stepping with BTB  Standing hip extension with BTB Standing hip abduction with BTB  Lap around clinic to cool down and assess gait.     PATIENT EDUCATION:  Education details: Patient educated on exam findings, POC, scope of PT, HEP, protocol/precaution, gait mechanics, and dressing change. 09/10/23: HEP, proper activity levels/progressions 10/22/23: HEP, DN Person educated: Patient Education method: Explanation, Demonstration, and Handouts Education comprehension: verbalized understanding, returned demonstration, verbal cues required, and tactile cues required  HOME EXERCISE PROGRAM: Access Code: [email or web address] URL: [email or web address] Date: 09/05/2023 Prepared by: Prentice Raelyn Racette  Exercises - Gluteal Sets (Mirrored)  - 3 x daily - 7 x weekly - 2 sets - 10 reps - 5-10 second hold - Supine Quadricep Sets (Mirrored)  - 3 x daily - 7 x weekly - 2 sets - 10 reps - 5-10 second hold - Clamshell (Mirrored)  - 3 x daily - 7 x weekly - 2 sets - 10 reps - Bent Knee Fallouts (Mirrored)  - 3 x daily - 7 x weekly - 2 sets - 10 reps  ASSESSMENT:  CLINICAL IMPRESSION: Began on elliptical for dynamic warm up. Continued to progress into strengthening which  is tolerated well. Will assess strength next session and possibly transition to more dynamic activity. Patient will continue to benefit from physical therapy in order to improve function and reduce impairment.   OBJECTIVE IMPAIRMENTS: Abnormal gait, decreased activity tolerance, decreased balance, decreased endurance, decreased mobility,  difficulty walking, decreased ROM, decreased strength, increased muscle spasms, impaired flexibility, improper body mechanics, and pain.   ACTIVITY LIMITATIONS: carrying, lifting, bending, standing, squatting, sleeping, stairs, transfers, bed mobility, bathing, toileting, dressing, hygiene/grooming, locomotion level, and caring for others  PARTICIPATION LIMITATIONS: meal prep, cleaning, laundry, driving, shopping, community activity, occupation, and yard work  PERSONAL FACTORS: Time since onset of injury/illness/exacerbation and 3+ comorbidities: HTN, HLD, sarcoidosis of lung are also affecting patient's functional outcome.   REHAB POTENTIAL: Good  CLINICAL DECISION MAKING: Stable/uncomplicated  EVALUATION COMPLEXITY: Low   GOALS: Goals reviewed with patient? Yes  SHORT TERM GOALS: Target date: 10/03/2023    Patient will be independent with HEP in order to improve functional outcomes. Baseline: Goal status: INITIAL  2.  Patient will report at least 25% improvement in symptoms for improved quality of life. Baseline: Goal status: INITIAL  3.  SLS 30s without compensation Baseline:  Goal status: INITIAL      LONG TERM GOALS: Target date: 11/28/2023    Patient will report at least 75% improvement in symptoms for improved quality of life. Baseline:  Goal status: INITIAL  2.  Patient will improve LEFS score by at least 48 points in order to indicate improved tolerance to activity. Baseline: 12/80 Goal status: INITIAL  3.  Patient will be able to navigate stairs with reciprocal pattern without compensation in order to demonstrate improved LE strength. Baseline:  Goal status: INITIAL  4.  Patient will demonstrate MMT 90% of opposite lower extremity in all tested musculature as evidence of improved strength to assist with stair ambulation and gait. Baseline:  Goal status: INITIAL  5.  Patient will be able to return to all activities unrestricted for improved ability to  perform work functions and participate with family.  Baseline:  Goal status: INITIAL    PLAN:  PT FREQUENCY: 1-2x/week  PT DURATION: 12 weeks  PLANNED INTERVENTIONS: 97164- PT Re-evaluation, 97110-Therapeutic exercises, 97530- Therapeutic activity, [id number]- Neuromuscular re-education, 97535- Self Care, [id number]- Manual therapy, [id number]- Gait training, [phone]- Orthotic Fit/training, [phone]- Canalith repositioning, [id number]- Aquatic Therapy, [phone]- Splinting, [phone]- Wound care (first 20 sq cm), 97598- Wound care (each additional 20 sq cm)Patient/Family education, Balance training, Stair training, Taping, Dry Needling, Joint mobilization, Joint manipulation, Spinal manipulation, Spinal mobilization, Scar mobilization, and DME instructions.  PLAN FOR NEXT SESSION: progress with hip labral repair protocol    Prentice GORMAN Stains, PT, DPT 11/18/2023, 3:18 PM

## 2023-11-20 ENCOUNTER — Encounter (HOSPITAL_BASED_OUTPATIENT_CLINIC_OR_DEPARTMENT_OTHER): Payer: Self-pay | Admitting: Physical Therapy

## 2023-11-20 ENCOUNTER — Ambulatory Visit (HOSPITAL_BASED_OUTPATIENT_CLINIC_OR_DEPARTMENT_OTHER): Admitting: Physical Therapy

## 2023-11-20 DIAGNOSIS — M6281 Muscle weakness (generalized): Secondary | ICD-10-CM

## 2023-11-20 DIAGNOSIS — R2689 Other abnormalities of gait and mobility: Secondary | ICD-10-CM

## 2023-11-20 DIAGNOSIS — M25552 Pain in left hip: Secondary | ICD-10-CM

## 2023-11-20 DIAGNOSIS — R29898 Other symptoms and signs involving the musculoskeletal system: Secondary | ICD-10-CM

## 2023-11-20 DIAGNOSIS — M5459 Other low back pain: Secondary | ICD-10-CM | POA: Diagnosis not present

## 2023-11-20 NOTE — Therapy (Signed)
 OUTPATIENT PHYSICAL THERAPY TREATMENT   Patient Name: Dominic Joseph MRN: [id number] DOB:[birth year], 43 y.o., male Today's Date: 11/20/2023  Progress Note   Reporting Period 09/05/23 to 11/20/23   See note below for Objective Data and Assessment of Progress/Goals   END OF SESSION:  PT End of Session - 11/20/23 1346     Visit Number 16    Number of Visits 30    Date for PT Re-Evaluation 01/01/24    Authorization Type [employer]    PT Start Time 1345    PT Stop Time 1428    PT Time Calculation (min) 43 min    Activity Tolerance Patient tolerated treatment well    Behavior During Therapy WFL for tasks assessed/performed             Past Medical History:  Diagnosis Date   Depression    Environmental allergies    Hyperlipidemia    Hypertension    Sarcoidosis of lung (HCC)    Syncope and collapse    Past Surgical History:  Procedure Laterality Date   LUNG BIOPSY Left    SHOULDER SURGERY Right    SHOULDER SURGERY Left 11/27/2021   WISDOM TOOTH EXTRACTION     Patient Active Problem List   Diagnosis Date Noted   Neutropenia (HCC) 08/15/2022   Family history of prostate cancer 06/09/2022   Healthcare maintenance 06/09/2022   Visit for suture removal 05/20/2022   Slow transit constipation 05/20/2022   Benign prostatic hyperplasia with urinary hesitancy 05/20/2022   Syncope and collapse    Cluster headaches 07/07/2019   Essential hypertension 07/07/2019   Fatigue 07/07/2019   ADD (attention deficit disorder) 07/07/2019   Severe depression (HCC) 07/07/2019   IBS (irritable colon syndrome) 07/07/2019   Elevated serum creatinine 07/07/2019   Chronic pansinusitis 11/30/2018   Perennial allergic rhinitis 11/30/2018   Tobacco use disorder 01/29/2012   Environmental allergies 01/28/2012   Plantar fascial fibromatosis 12/23/2011   Asthma 07/25/2011   Sarcoidosis 07/25/2011   Pain in joint involving lower leg 06/10/2011   Primary localized osteoarthrosis, lower leg  06/10/2011    PCP: Berneta Elsie Sayre, MD  REFERRING PROVIDER: Genelle Elspeth, MD  REFERRING DIAG: [phone] (ICD-10-CM) - Tear of left acetabular labrum, initial encounter; L hip arthroscopy with labral repair DOS 09/03/23  THERAPY DIAG:  Pain in left hip  Muscle weakness (generalized)  Other abnormalities of gait and mobility  Other symptoms and signs involving the musculoskeletal system  Rationale for Evaluation and Treatment: Rehabilitation  ONSET DATE:  DOS 09/03/23  SUBJECTIVE:   SUBJECTIVE STATEMENT: Patient states some muscle soreness but overall doing well. Patient states 85-90% functional status. Limited by strength and stability.  EVAL: Patient states he has not been putting weight on his leg yet with walking. Sleeping in recliner is more comfortable than bed. Using crutches. Normally active: weightlifting, riding bike, etc.   PERTINENT HISTORY: HTN, HLD, sarcoidosis of lung, hx LBP PAIN:  Are you having pain? Yes: NPRS scale: 0/10 Pain location: L hip Pain description: all of the above Aggravating factors: sleeping in bed Relieving factors: rest   PRECAUTIONS: Other: L hip arthroscopy with labral repair   WEIGHT BEARING RESTRICTIONS: WBAT  FALLS:  Has patient fallen in last 6 months? No  OCCUPATION: [employer]  PLOF: Independent  PATIENT GOALS: strengthening, decrease pain, rehab from surgery  OBJECTIVE: (objective measures from initial evaluation unless otherwise dated)  Observation: sutures intact, No s/s of infection  PATIENT SURVEYS:  LEFS  Extreme difficulty/unable (0),  Quite a bit of difficulty (1), Moderate difficulty (2), Little difficulty (3), No difficulty (4) Survey date:  09/05/23 11/20/23  Any of your usual work, housework or school activities 0 4  2. Usual hobbies, recreational or sporting activities 0 2  3. Getting into/out of the bath 0 4  4. Walking between rooms 1 4  5. Putting on socks/shoes 1 4  6. Squatting  1 3  7.  Lifting an object, like a bag of groceries from the floor 1 4  8. Performing light activities around your home 2 4  9. Performing heavy activities around your home 0 3  10. Getting into/out of a car 1 3  11. Walking 2 blocks 1 2  12. Walking 1 mile 0 2  13. Going up/down 10 stairs (1 flight) 1 3  14. Standing for 1 hour 1 2  15.  sitting for 1 hour 1 1  16. Running on even ground 0 2  17. Running on uneven ground 0 1  18. Making sharp turns while running fast 0 1  19. Hopping  0 4  20. Rolling over in bed 1 3  Score total:  12/80 56/80     COGNITION: Overall cognitive status: Within functional limits for tasks assessed     SENSATION: WFL  POSTURE: No Significant postural limitations  PALPATION: Grossly Tender L hip  LOWER EXTREMITY ROM:  Passive ROM Right eval Left eval  Hip flexion  90  Hip extension    Hip abduction  30  Hip adduction    Hip internal rotation  20  Hip external rotation  20  Knee flexion    Knee extension    Ankle dorsiflexion    Ankle plantarflexion    Ankle inversion    Ankle eversion     (Blank rows = not tested) *= pain/symptoms  LOWER EXTREMITY MMT:  MMT Right eval Left eval Right 11/20/23 Left 11/20/23  Hip flexion   91.4 94.9  Hip extension   89.4 84.6  Hip abduction   116.9 95  Hip adduction      Hip internal rotation      Hip external rotation      Knee flexion 5 4- *    Knee extension 5 4    Ankle dorsiflexion      Ankle plantarflexion      Ankle inversion      Ankle eversion       (Blank rows = not tested) *= pain/symptoms   FUNCTIONAL TESTS:  NT due to post op status  GAIT: Distance walked: 100 feet Assistive device utilized: Crutches Level of assistance: Modified independence Comments: NWB L hip; able to ambulate with bilateral axillary crutches following cueing/education   TODAY'S TREATMENT:                                                                                                                               DATE:  11/20/23 Elliptical  5 minutes, level 4 for dynamic warm up and conditioning Reassessment Hop onto/off airex 1 x 10 Hip flexor stretch 3 x 20 second holds Manual: stm hip flexors Thomas hip flexor stretch   11/18/23 Elliptical 5 minutes, level 4 for dynamic warm up and conditioning SL hip hinge 15# 3 x 10 Split squat 13# kb 2x10 Curtsy lunge 13# kb 2 x 10 Barbell squat 95# 1 x 10, 135# 2 x 10 Hip flexor stretch 3 x 20 second holds Standing adductor stretch 35 x 10-15 second holds  11/06/23 Elliptical 5 minutes, level 4 for dynamic warm up and conditioning Manual: STM/TPR hip flexors, TFL; grade III PA glides in progressive ER and IR in prone SL hip hinge 10# 3 x 10 Standing hip flexor stretch 3 x 20-30 second holds Lunge 1 x 10  11/04/23 Elliptical 5 minutes, level 4 for dynamic warm up and conditioning STS with LLE bias 15# 2 x 10 Hip hinge 15# 1 x 15 SL hip hinge 3 x 8 Mini squat slide out 3 way 2 x 5  Manual: STM/TPR hip flexors, TFL Education on self STM  10/28/23 Elliptical 5 minutes, level 4 for dynamic warm up and conditioning Squat 1 x 20, 15# 1 x 20 Mini squat slide out 3 way 2 x 5  Hip hinge 15# 2 x 15 Manual: STM/TPR to glutes and piriformis Shuttle DL 899# 1 x 15, SL 24# 1 x 15, sidelying 50# 1 x 15 Supine thomas stretch 2 x 30 second holds Supine figure 4 stretch 4 x 20 second holds Supine active hamstring stretch 3 x 10 second holds  10/26/23: Upright bike 5 minutes, lvl 9 Seated HS stretch  Modified thomas stretch  Bridges 2x 10  Quadruped rocking  Quadruped cat/cow  Supine figure 4 stretch  STM to HS with muscle bending and pin and stretch Cross friction massage to piriformis.    10/22/23 Manual: STM to L glutes pre and post dry needling for trigger point identification and muscular relaxation. Trigger Point Dry Needling  Initial Treatment: Pt instructed on Dry Needling rational, procedures, and possible side effects. Pt instructed to  expect mild to moderate muscle soreness later in the day and/or into the next day.  Pt instructed in methods to reduce muscle soreness. Pt instructed to continue prescribed HEP. Patient was educated on signs and symptoms of infection and other risk factors and advised to seek medical attention should they occur.  Patient verbalized understanding of these instructions and education.   Patient Verbal Consent Given: Yes Education Handout Provided: Yes Muscles Treated: L glutes, hip flexor Electrical Stimulation Performed: No Treatment Response/Outcome: twitch response, decrease in tissue tension  Supine active hamstring stretch 5 x 10 second holds Test retest with ambulation throughout Education on continued HEP, DN   10/15/2023: Upright bike 5 minutes, lvl 9 Step up 6 inch 2 x 10 Lateral step up 6 step 2 10 Bridges with SL eccentric control.  Modified thomas stretch to tolerance for hip flexor stretch Side stepping with BTB  Standing hip extension with BTB Standing hip abduction with BTB  Lap around clinic to cool down and assess gait.     PATIENT EDUCATION:  Education details: Patient educated on exam findings, POC, scope of PT, HEP, protocol/precaution, gait mechanics, and dressing change. 09/10/23: HEP, proper activity levels/progressions 10/22/23: HEP, DN Person educated: Patient Education method: Explanation, Demonstration, and Handouts Education comprehension: verbalized understanding, returned demonstration, verbal cues required, and tactile cues required  HOME EXERCISE PROGRAM: Access  Code: [email or web address] URL: [email or web address] Date: 09/05/2023 Prepared by: Prentice Handy Mcloud  Exercises - Gluteal Sets (Mirrored)  - 3 x daily - 7 x weekly - 2 sets - 10 reps - 5-10 second hold - Supine Quadricep Sets (Mirrored)  - 3 x daily - 7 x weekly - 2 sets - 10 reps - 5-10 second hold - Clamshell (Mirrored)  - 3 x daily - 7 x weekly - 2 sets - 10 reps - Bent Knee  Fallouts (Mirrored)  - 3 x daily - 7 x weekly - 2 sets - 10 reps  ASSESSMENT:  CLINICAL IMPRESSION: Began on elliptical for dynamic warm up. Patient has met 3/3 short term goals and 2/5 long term goals with ability to complete HEP and improvement in symptoms, strength, ROM, activity tolerance, gait, balance, and functional mobility. Remaining goals not met due to continued deficits in strength, stability, motor control and return to full activity. Patient has made good progress toward remaining goals. Extending POC to continue to improve function for return her level exercise . Patient will continue to benefit from skilled physical therapy in order to improve function and reduce impairment.     OBJECTIVE IMPAIRMENTS: Abnormal gait, decreased activity tolerance, decreased balance, decreased endurance, decreased mobility, difficulty walking, decreased ROM, decreased strength, increased muscle spasms, impaired flexibility, improper body mechanics, and pain.   ACTIVITY LIMITATIONS: carrying, lifting, bending, standing, squatting, sleeping, stairs, transfers, bed mobility, bathing, toileting, dressing, hygiene/grooming, locomotion level, and caring for others  PARTICIPATION LIMITATIONS: meal prep, cleaning, laundry, driving, shopping, community activity, occupation, and yard work  PERSONAL FACTORS: Time since onset of injury/illness/exacerbation and 3+ comorbidities: HTN, HLD, sarcoidosis of lung are also affecting patient's functional outcome.   REHAB POTENTIAL: Good  CLINICAL DECISION MAKING: Stable/uncomplicated  EVALUATION COMPLEXITY: Low   GOALS: Goals reviewed with patient? Yes  SHORT TERM GOALS: Target date: 10/03/2023    Patient will be independent with HEP in order to improve functional outcomes. Baseline: Goal status: MET  2.  Patient will report at least 25% improvement in symptoms for improved quality of life. Baseline: Goal status: MET  3.  SLS 30s without  compensation Baseline:  Goal status: MET      LONG TERM GOALS: Target date: 11/28/2023    Patient will report at least 75% improvement in symptoms for improved quality of life. Baseline:  Goal status: MET  2.  Patient will improve LEFS score by at least 48 points in order to indicate improved tolerance to activity. Baseline: 12/80 Goal status: INITIAL  3.  Patient will be able to navigate stairs with reciprocal pattern without compensation in order to demonstrate improved LE strength. Baseline:  Goal status: MET  4.  Patient will demonstrate MMT 90% of opposite lower extremity in all tested musculature as evidence of improved strength to assist with stair ambulation and gait. Baseline:  Goal status: INITIAL  5.  Patient will be able to return to all activities unrestricted for improved ability to perform work functions and participate with family.  Baseline:  Goal status: INITIAL    PLAN:  PT FREQUENCY: 1-2x/week  PT DURATION: 6 weeks  PLANNED INTERVENTIONS: [id number]- PT Re-evaluation, 97110-Therapeutic exercises, 97530- Therapeutic activity, [id number]- Neuromuscular re-education, 97535- Self Care, [id number]- Manual therapy, [id number]- Gait training, [phone]- Orthotic Fit/training, [phone]- Canalith repositioning, [id number]- Aquatic Therapy, [phone]- Splinting, [phone]- Wound care (first 20 sq cm), 97598- Wound care (each additional 20 sq cm)Patient/Family education, Balance training, Stair training, Taping, Dry Needling, Joint mobilization, Joint manipulation, Spinal manipulation, Spinal mobilization, Scar  mobilization, and DME instructions.  PLAN FOR NEXT SESSION: progress with hip labral repair protocol    Prentice GORMAN Stains, PT, DPT 11/20/2023, 2:31 PM

## 2023-11-25 ENCOUNTER — Encounter (HOSPITAL_BASED_OUTPATIENT_CLINIC_OR_DEPARTMENT_OTHER): Admitting: Physical Therapy

## 2023-11-27 ENCOUNTER — Ambulatory Visit (HOSPITAL_BASED_OUTPATIENT_CLINIC_OR_DEPARTMENT_OTHER): Attending: Orthopaedic Surgery | Admitting: Physical Therapy

## 2023-11-27 ENCOUNTER — Encounter (HOSPITAL_BASED_OUTPATIENT_CLINIC_OR_DEPARTMENT_OTHER): Payer: Self-pay | Admitting: Physical Therapy

## 2023-11-27 DIAGNOSIS — R2689 Other abnormalities of gait and mobility: Secondary | ICD-10-CM | POA: Insufficient documentation

## 2023-11-27 DIAGNOSIS — M6281 Muscle weakness (generalized): Secondary | ICD-10-CM | POA: Diagnosis not present

## 2023-11-27 DIAGNOSIS — R29898 Other symptoms and signs involving the musculoskeletal system: Secondary | ICD-10-CM | POA: Insufficient documentation

## 2023-11-27 DIAGNOSIS — M25552 Pain in left hip: Secondary | ICD-10-CM | POA: Insufficient documentation

## 2023-11-27 NOTE — Therapy (Signed)
 OUTPATIENT PHYSICAL THERAPY TREATMENT   Patient Name: Dominic Joseph MRN: [id number] DOB:11-22-1980, 43 y.o., male Today's Date: 11/27/2023  Progress Note   Reporting Period 09/05/23 to 11/20/23   See note below for Objective Data and Assessment of Progress/Goals   END OF SESSION:  PT End of Session - 11/27/23 1442     Visit Number 17    Number of Visits 30    Date for PT Re-Evaluation 01/01/24    Authorization Type [employer]    PT Start Time 1436    PT Stop Time 1510    PT Time Calculation (min) 34 min    Activity Tolerance Patient tolerated treatment well    Behavior During Therapy WFL for tasks assessed/performed             Past Medical History:  Diagnosis Date   Depression    Environmental allergies    Hyperlipidemia    Hypertension    Sarcoidosis of lung (HCC)    Syncope and collapse    Past Surgical History:  Procedure Laterality Date   LUNG BIOPSY Left    SHOULDER SURGERY Right    SHOULDER SURGERY Left 11/27/2021   WISDOM TOOTH EXTRACTION     Patient Active Problem List   Diagnosis Date Noted   Neutropenia (HCC) 08/15/2022   Family history of prostate cancer 06/09/2022   Healthcare maintenance 06/09/2022   Visit for suture removal 05/20/2022   Slow transit constipation 05/20/2022   Benign prostatic hyperplasia with urinary hesitancy 05/20/2022   Syncope and collapse    Cluster headaches 07/07/2019   Essential hypertension 07/07/2019   Fatigue 07/07/2019   ADD (attention deficit disorder) 07/07/2019   Severe depression (HCC) 07/07/2019   IBS (irritable colon syndrome) 07/07/2019   Elevated serum creatinine 07/07/2019   Chronic pansinusitis 11/30/2018   Perennial allergic rhinitis 11/30/2018   Tobacco use disorder 01/29/2012   Environmental allergies 01/28/2012   Plantar fascial fibromatosis 12/23/2011   Asthma 07/25/2011   Sarcoidosis 07/25/2011   Pain in joint involving lower leg 06/10/2011   Primary localized osteoarthrosis, lower leg  06/10/2011    PCP: Berneta Elsie Sayre, MD  REFERRING PROVIDER: Genelle Elspeth, MD  REFERRING DIAG: [phone] (ICD-10-CM) - Tear of left acetabular labrum, initial encounter; L hip arthroscopy with labral repair DOS 09/03/23  THERAPY DIAG:  Pain in left hip  Muscle weakness (generalized)  Other abnormalities of gait and mobility  Other symptoms and signs involving the musculoskeletal system  Rationale for Evaluation and Treatment: Rehabilitation  ONSET DATE:  DOS 09/03/23  SUBJECTIVE:   SUBJECTIVE STATEMENT: Patient states some hip soreness with trying to run faster.   EVAL: Patient states he has not been putting weight on his leg yet with walking. Sleeping in recliner is more comfortable than bed. Using crutches. Normally active: weightlifting, riding bike, etc.   PERTINENT HISTORY: HTN, HLD, sarcoidosis of lung, hx LBP PAIN:  Are you having pain? Yes: NPRS scale: 0/10 Pain location: L hip Pain description: all of the above Aggravating factors: sleeping in bed Relieving factors: rest   PRECAUTIONS: Other: L hip arthroscopy with labral repair   WEIGHT BEARING RESTRICTIONS: WBAT  FALLS:  Has patient fallen in last 6 months? No  OCCUPATION: [employer]  PLOF: Independent  PATIENT GOALS: strengthening, decrease pain, rehab from surgery  OBJECTIVE: (objective measures from initial evaluation unless otherwise dated)  Observation: sutures intact, No s/s of infection  PATIENT SURVEYS:  LEFS  Extreme difficulty/unable (0), Quite a bit of difficulty (1), Moderate difficulty (  2), Little difficulty (3), No difficulty (4) Survey date:  09/05/23 11/20/23  Any of your usual work, housework or school activities 0 4  2. Usual hobbies, recreational or sporting activities 0 2  3. Getting into/out of the bath 0 4  4. Walking between rooms 1 4  5. Putting on socks/shoes 1 4  6. Squatting  1 3  7. Lifting an object, like a bag of groceries from the floor 1 4  8.  Performing light activities around your home 2 4  9. Performing heavy activities around your home 0 3  10. Getting into/out of a car 1 3  11. Walking 2 blocks 1 2  12. Walking 1 mile 0 2  13. Going up/down 10 stairs (1 flight) 1 3  14. Standing for 1 hour 1 2  15.  sitting for 1 hour 1 1  16. Running on even ground 0 2  17. Running on uneven ground 0 1  18. Making sharp turns while running fast 0 1  19. Hopping  0 4  20. Rolling over in bed 1 3  Score total:  12/80 56/80     COGNITION: Overall cognitive status: Within functional limits for tasks assessed     SENSATION: WFL  POSTURE: No Significant postural limitations  PALPATION: Grossly Tender L hip  LOWER EXTREMITY ROM:  Passive ROM Right eval Left eval  Hip flexion  90  Hip extension    Hip abduction  30  Hip adduction    Hip internal rotation  20  Hip external rotation  20  Knee flexion    Knee extension    Ankle dorsiflexion    Ankle plantarflexion    Ankle inversion    Ankle eversion     (Blank rows = not tested) *= pain/symptoms  LOWER EXTREMITY MMT:  MMT Right eval Left eval Right 11/20/23 Left 11/20/23  Hip flexion   91.4 94.9  Hip extension   89.4 84.6  Hip abduction   116.9 95  Hip adduction      Hip internal rotation      Hip external rotation      Knee flexion 5 4- *    Knee extension 5 4    Ankle dorsiflexion      Ankle plantarflexion      Ankle inversion      Ankle eversion       (Blank rows = not tested) *= pain/symptoms   FUNCTIONAL TESTS:  NT due to post op status  GAIT: Distance walked: 100 feet Assistive device utilized: Crutches Level of assistance: Modified independence Comments: NWB L hip; able to ambulate with bilateral axillary crutches following cueing/education   TODAY'S TREATMENT:                                                                                                                              DATE:  11/27/23 Elliptical 5 minutes, level 4 for dynamic  warm up  and conditioning Walking lunges 3 x 25 feet Standing hip flexor stretch 5 x 20 second holds Retro sliding lunge 2 x 10  Lateral shuffle 3 x 25 feet Jogging 3 x 25 feet High skipping 3 x 25 feet Karaoke 3 x 25 feet  11/20/23 Elliptical 5 minutes, level 4 for dynamic warm up and conditioning Reassessment Hop onto/off airex 1 x 10 Hip flexor stretch 3 x 20 second holds Manual: stm hip flexors Thomas hip flexor stretch   11/18/23 Elliptical 5 minutes, level 4 for dynamic warm up and conditioning SL hip hinge 15# 3 x 10 Split squat 13# kb 2x10 Curtsy lunge 13# kb 2 x 10 Barbell squat 95# 1 x 10, 135# 2 x 10 Hip flexor stretch 3 x 20 second holds Standing adductor stretch 35 x 10-15 second holds  11/06/23 Elliptical 5 minutes, level 4 for dynamic warm up and conditioning Manual: STM/TPR hip flexors, TFL; grade III PA glides in progressive ER and IR in prone SL hip hinge 10# 3 x 10 Standing hip flexor stretch 3 x 20-30 second holds Lunge 1 x 10  11/04/23 Elliptical 5 minutes, level 4 for dynamic warm up and conditioning STS with LLE bias 15# 2 x 10 Hip hinge 15# 1 x 15 SL hip hinge 3 x 8 Mini squat slide out 3 way 2 x 5  Manual: STM/TPR hip flexors, TFL Education on self STM  10/28/23 Elliptical 5 minutes, level 4 for dynamic warm up and conditioning Squat 1 x 20, 15# 1 x 20 Mini squat slide out 3 way 2 x 5  Hip hinge 15# 2 x 15 Manual: STM/TPR to glutes and piriformis Shuttle DL 899# 1 x 15, SL 24# 1 x 15, sidelying 50# 1 x 15 Supine thomas stretch 2 x 30 second holds Supine figure 4 stretch 4 x 20 second holds Supine active hamstring stretch 3 x 10 second holds  10/26/23: Upright bike 5 minutes, lvl 9 Seated HS stretch  Modified thomas stretch  Bridges 2x 10  Quadruped rocking  Quadruped cat/cow  Supine figure 4 stretch  STM to HS with muscle bending and pin and stretch Cross friction massage to piriformis.    10/22/23 Manual: STM to L glutes pre and post  dry needling for trigger point identification and muscular relaxation. Trigger Point Dry Needling  Initial Treatment: Pt instructed on Dry Needling rational, procedures, and possible side effects. Pt instructed to expect mild to moderate muscle soreness later in the day and/or into the next day.  Pt instructed in methods to reduce muscle soreness. Pt instructed to continue prescribed HEP. Patient was educated on signs and symptoms of infection and other risk factors and advised to seek medical attention should they occur.  Patient verbalized understanding of these instructions and education.   Patient Verbal Consent Given: Yes Education Handout Provided: Yes Muscles Treated: L glutes, hip flexor Electrical Stimulation Performed: No Treatment Response/Outcome: twitch response, decrease in tissue tension  Supine active hamstring stretch 5 x 10 second holds Test retest with ambulation throughout Education on continued HEP, DN   10/15/2023: Upright bike 5 minutes, lvl 9 Step up 6 inch 2 x 10 Lateral step up 6 step 2 10 Bridges with SL eccentric control.  Modified thomas stretch to tolerance for hip flexor stretch Side stepping with BTB  Standing hip extension with BTB Standing hip abduction with BTB  Lap around clinic to cool down and assess gait.     PATIENT EDUCATION:  Education details: Patient educated  on exam findings, POC, scope of PT, HEP, protocol/precaution, gait mechanics, and dressing change. 09/10/23: HEP, proper activity levels/progressions 10/22/23: HEP, DN Person educated: Patient Education method: Explanation, Demonstration, and Handouts Education comprehension: verbalized understanding, returned demonstration, verbal cues required, and tactile cues required  HOME EXERCISE PROGRAM: Access Code: [email or web address] URL: [email or web address] Date: 09/05/2023 Prepared by: Prentice Donis Pinder  Exercises - Gluteal Sets (Mirrored)  - 3 x daily - 7 x weekly - 2  sets - 10 reps - 5-10 second hold - Supine Quadricep Sets (Mirrored)  - 3 x daily - 7 x weekly - 2 sets - 10 reps - 5-10 second hold - Clamshell (Mirrored)  - 3 x daily - 7 x weekly - 2 sets - 10 reps - Bent Knee Fallouts (Mirrored)  - 3 x daily - 7 x weekly - 2 sets - 10 reps  ASSESSMENT:  CLINICAL IMPRESSION: Began on elliptical for dynamic warm up. Continued hip flexor tightness. Worked into elongated hip flexor exercises and more dynamic exercises which are tolerated well. Fatigue 7-8/10 at EOS. Patient will continue to benefit from skilled physical therapy in order to improve function and reduce impairment.     OBJECTIVE IMPAIRMENTS: Abnormal gait, decreased activity tolerance, decreased balance, decreased endurance, decreased mobility, difficulty walking, decreased ROM, decreased strength, increased muscle spasms, impaired flexibility, improper body mechanics, and pain.   ACTIVITY LIMITATIONS: carrying, lifting, bending, standing, squatting, sleeping, stairs, transfers, bed mobility, bathing, toileting, dressing, hygiene/grooming, locomotion level, and caring for others  PARTICIPATION LIMITATIONS: meal prep, cleaning, laundry, driving, shopping, community activity, occupation, and yard work  PERSONAL FACTORS: Time since onset of injury/illness/exacerbation and 3+ comorbidities: HTN, HLD, sarcoidosis of lung are also affecting patient's functional outcome.   REHAB POTENTIAL: Good  CLINICAL DECISION MAKING: Stable/uncomplicated  EVALUATION COMPLEXITY: Low   GOALS: Goals reviewed with patient? Yes  SHORT TERM GOALS: Target date: 10/03/2023    Patient will be independent with HEP in order to improve functional outcomes. Baseline: Goal status: MET  2.  Patient will report at least 25% improvement in symptoms for improved quality of life. Baseline: Goal status: MET  3.  SLS 30s without compensation Baseline:  Goal status: MET      LONG TERM GOALS: Target date:  11/28/2023    Patient will report at least 75% improvement in symptoms for improved quality of life. Baseline:  Goal status: MET  2.  Patient will improve LEFS score by at least 48 points in order to indicate improved tolerance to activity. Baseline: 12/80 Goal status: INITIAL  3.  Patient will be able to navigate stairs with reciprocal pattern without compensation in order to demonstrate improved LE strength. Baseline:  Goal status: MET  4.  Patient will demonstrate MMT 90% of opposite lower extremity in all tested musculature as evidence of improved strength to assist with stair ambulation and gait. Baseline:  Goal status: INITIAL  5.  Patient will be able to return to all activities unrestricted for improved ability to perform work functions and participate with family.  Baseline:  Goal status: INITIAL    PLAN:  PT FREQUENCY: 1-2x/week  PT DURATION: 6 weeks  PLANNED INTERVENTIONS: 97164- PT Re-evaluation, 97110-Therapeutic exercises, 97530- Therapeutic activity, [id number]- Neuromuscular re-education, 97535- Self Care, [id number]- Manual therapy, [id number]- Gait training, [phone]- Orthotic Fit/training, [phone]- Canalith repositioning, [id number]- Aquatic Therapy, 97760- Splinting, [phone]- Wound care (first 20 sq cm), 97598- Wound care (each additional 20 sq cm)Patient/Family education, Balance training, Stair training, Taping, Dry Needling, Joint mobilization, Joint manipulation, Spinal manipulation, Spinal  mobilization, Scar mobilization, and DME instructions.  PLAN FOR NEXT SESSION: progress with hip labral repair protocol    Prentice GORMAN Stains, PT, DPT 11/27/2023, 3:11 PM

## 2023-11-28 DIAGNOSIS — E669 Obesity, unspecified: Secondary | ICD-10-CM | POA: Diagnosis not present

## 2023-12-02 ENCOUNTER — Encounter (HOSPITAL_BASED_OUTPATIENT_CLINIC_OR_DEPARTMENT_OTHER): Admitting: Orthopaedic Surgery

## 2023-12-02 ENCOUNTER — Encounter (HOSPITAL_BASED_OUTPATIENT_CLINIC_OR_DEPARTMENT_OTHER): Admitting: Physical Therapy

## 2023-12-04 ENCOUNTER — Encounter (HOSPITAL_BASED_OUTPATIENT_CLINIC_OR_DEPARTMENT_OTHER): Payer: Self-pay | Admitting: Physical Therapy

## 2023-12-04 ENCOUNTER — Ambulatory Visit (HOSPITAL_BASED_OUTPATIENT_CLINIC_OR_DEPARTMENT_OTHER): Admitting: Physical Therapy

## 2023-12-04 ENCOUNTER — Ambulatory Visit (INDEPENDENT_AMBULATORY_CARE_PROVIDER_SITE_OTHER): Admitting: Orthopaedic Surgery

## 2023-12-04 DIAGNOSIS — M25552 Pain in left hip: Secondary | ICD-10-CM

## 2023-12-04 DIAGNOSIS — M6281 Muscle weakness (generalized): Secondary | ICD-10-CM | POA: Diagnosis not present

## 2023-12-04 DIAGNOSIS — S73192A Other sprain of left hip, initial encounter: Secondary | ICD-10-CM

## 2023-12-04 DIAGNOSIS — R29898 Other symptoms and signs involving the musculoskeletal system: Secondary | ICD-10-CM | POA: Diagnosis not present

## 2023-12-04 DIAGNOSIS — R2689 Other abnormalities of gait and mobility: Secondary | ICD-10-CM

## 2023-12-04 NOTE — Therapy (Signed)
 OUTPATIENT PHYSICAL THERAPY TREATMENT   Patient Name: Dominic Joseph MRN: [id number] DOB:September 29, 1980, 43 y.o., male Today's Date: 12/04/2023  Progress Note   Reporting Period 09/05/23 to 11/20/23   See note below for Objective Data and Assessment of Progress/Goals   END OF SESSION:  PT End of Session - 12/04/23 1150     Visit Number 18    Number of Visits 30    Date for PT Re-Evaluation 01/01/24    Authorization Type [employer]    PT Start Time 1147    PT Stop Time 1227    PT Time Calculation (min) 40 min    Activity Tolerance Patient tolerated treatment well    Behavior During Therapy [facility] for tasks assessed/performed             Past Medical History:  Diagnosis Date   Depression    Environmental allergies    Hyperlipidemia    Hypertension    Sarcoidosis of lung (HCC)    Syncope and collapse    Past Surgical History:  Procedure Laterality Date   LUNG BIOPSY Left    SHOULDER SURGERY Right    SHOULDER SURGERY Left 11/27/2021   WISDOM TOOTH EXTRACTION     Patient Active Problem List   Diagnosis Date Noted   Neutropenia (HCC) 08/15/2022   Family history of prostate cancer 06/09/2022   Healthcare maintenance 06/09/2022   Visit for suture removal 05/20/2022   Slow transit constipation 05/20/2022   Benign prostatic hyperplasia with urinary hesitancy 05/20/2022   Syncope and collapse    Cluster headaches 07/07/2019   Essential hypertension 07/07/2019   Fatigue 07/07/2019   ADD (attention deficit disorder) 07/07/2019   Severe depression (HCC) 07/07/2019   IBS (irritable colon syndrome) 07/07/2019   Elevated serum creatinine 07/07/2019   Chronic pansinusitis 11/30/2018   Perennial allergic rhinitis 11/30/2018   Tobacco use disorder 01/29/2012   Environmental allergies 01/28/2012   Plantar fascial fibromatosis 12/23/2011   Asthma 07/25/2011   Sarcoidosis 07/25/2011   Pain in joint involving lower leg 06/10/2011   Primary localized osteoarthrosis, lower leg  06/10/2011    PCP: Berneta Elsie Sayre, MD  REFERRING PROVIDER: Genelle Elspeth, MD  REFERRING DIAG: [phone] (ICD-10-CM) - Tear of left acetabular labrum, initial encounter; L hip arthroscopy with labral repair DOS 09/03/23  THERAPY DIAG:  Pain in left hip  Muscle weakness (generalized)  Other abnormalities of gait and mobility  Other symptoms and signs involving the musculoskeletal system  Rationale for Evaluation and Treatment: Rehabilitation  ONSET DATE:  DOS 09/03/23  SUBJECTIVE:   SUBJECTIVE STATEMENT: Patient states MD released him. Was able to hop the other day.  EVAL: Patient states he has not been putting weight on his leg yet with walking. Sleeping in recliner is more comfortable than bed. Using crutches. Normally active: weightlifting, riding bike, etc.   PERTINENT HISTORY: HTN, HLD, sarcoidosis of lung, hx LBP PAIN:  Are you having pain? Yes: NPRS scale: 0/10 Pain location: L hip Pain description: all of the above Aggravating factors: sleeping in bed Relieving factors: rest   PRECAUTIONS: Other: L hip arthroscopy with labral repair   WEIGHT BEARING RESTRICTIONS: WBAT  FALLS:  Has patient fallen in last 6 months? No  OCCUPATION: [employer]  PLOF: Independent  PATIENT GOALS: strengthening, decrease pain, rehab from surgery  OBJECTIVE: (objective measures from initial evaluation unless otherwise dated)  Observation: sutures intact, No s/s of infection  PATIENT SURVEYS:  LEFS  Extreme difficulty/unable (0), Quite a bit of difficulty (1), Moderate  difficulty (2), Little difficulty (3), No difficulty (4) Survey date:  09/05/23 11/20/23  Any of your usual work, housework or school activities 0 4  2. Usual hobbies, recreational or sporting activities 0 2  3. Getting into/out of the bath 0 4  4. Walking between rooms 1 4  5. Putting on socks/shoes 1 4  6. Squatting  1 3  7. Lifting an object, like a bag of groceries from the floor 1 4  8.  Performing light activities around your home 2 4  9. Performing heavy activities around your home 0 3  10. Getting into/out of a car 1 3  11. Walking 2 blocks 1 2  12. Walking 1 mile 0 2  13. Going up/down 10 stairs (1 flight) 1 3  14. Standing for 1 hour 1 2  15.  sitting for 1 hour 1 1  16. Running on even ground 0 2  17. Running on uneven ground 0 1  18. Making sharp turns while running fast 0 1  19. Hopping  0 4  20. Rolling over in bed 1 3  Score total:  12/80 56/80     COGNITION: Overall cognitive status: Within functional limits for tasks assessed     SENSATION: WFL  POSTURE: No Significant postural limitations  PALPATION: Grossly Tender L hip  LOWER EXTREMITY ROM:  Passive ROM Right eval Left eval  Hip flexion  90  Hip extension    Hip abduction  30  Hip adduction    Hip internal rotation  20  Hip external rotation  20  Knee flexion    Knee extension    Ankle dorsiflexion    Ankle plantarflexion    Ankle inversion    Ankle eversion     (Blank rows = not tested) *= pain/symptoms  LOWER EXTREMITY MMT:  MMT Right eval Left eval Right 11/20/23 Left 11/20/23  Hip flexion   91.4 94.9  Hip extension   89.4 84.6  Hip abduction   116.9 95  Hip adduction      Hip internal rotation      Hip external rotation      Knee flexion 5 4- *    Knee extension 5 4    Ankle dorsiflexion      Ankle plantarflexion      Ankle inversion      Ankle eversion       (Blank rows = not tested) *= pain/symptoms   FUNCTIONAL TESTS:  NT due to post op status  GAIT: Distance walked: 100 feet Assistive device utilized: Crutches Level of assistance: Modified independence Comments: NWB L hip; able to ambulate with bilateral axillary crutches following cueing/education   TODAY'S TREATMENT:                                                                                                                              DATE:  12/04/23 Elliptical 5 minutes, level 4 for dynamic  warm  up and conditioning Standing hip flexor stretch 5 x 20 second holds Walking lunges 3 x 25 feet Lateral shuffle 3 x 25 feet High skipping 3 x 25 feet Jogging 3 x 100 feet Lateral bounding 3 x 20 Squat jump 3 x 5 Box jump 20 inch x 10   11/27/23 Elliptical 5 minutes, level 4 for dynamic warm up and conditioning Walking lunges 3 x 25 feet Standing hip flexor stretch 5 x 20 second holds Retro sliding lunge 2 x 10  Lateral shuffle 3 x 25 feet Jogging 3 x 25 feet High skipping 3 x 25 feet Karaoke 3 x 25 feet  11/20/23 Elliptical 5 minutes, level 4 for dynamic warm up and conditioning Reassessment Hop onto/off airex 1 x 10 Hip flexor stretch 3 x 20 second holds Manual: stm hip flexors Thomas hip flexor stretch   11/18/23 Elliptical 5 minutes, level 4 for dynamic warm up and conditioning SL hip hinge 15# 3 x 10 Split squat 13# kb 2x10 Curtsy lunge 13# kb 2 x 10 Barbell squat 95# 1 x 10, 135# 2 x 10 Hip flexor stretch 3 x 20 second holds Standing adductor stretch 35 x 10-15 second holds  11/06/23 Elliptical 5 minutes, level 4 for dynamic warm up and conditioning Manual: STM/TPR hip flexors, TFL; grade III PA glides in progressive ER and IR in prone SL hip hinge 10# 3 x 10 Standing hip flexor stretch 3 x 20-30 second holds Lunge 1 x 10  11/04/23 Elliptical 5 minutes, level 4 for dynamic warm up and conditioning STS with LLE bias 15# 2 x 10 Hip hinge 15# 1 x 15 SL hip hinge 3 x 8 Mini squat slide out 3 way 2 x 5  Manual: STM/TPR hip flexors, TFL Education on self STM  10/28/23 Elliptical 5 minutes, level 4 for dynamic warm up and conditioning Squat 1 x 20, 15# 1 x 20 Mini squat slide out 3 way 2 x 5  Hip hinge 15# 2 x 15 Manual: STM/TPR to glutes and piriformis Shuttle DL 899# 1 x 15, SL 24# 1 x 15, sidelying 50# 1 x 15 Supine thomas stretch 2 x 30 second holds Supine figure 4 stretch 4 x 20 second holds Supine active hamstring stretch 3 x 10 second  holds  10/26/23: Upright bike 5 minutes, lvl 9 Seated HS stretch  Modified thomas stretch  Bridges 2x 10  Quadruped rocking  Quadruped cat/cow  Supine figure 4 stretch  STM to HS with muscle bending and pin and stretch Cross friction massage to piriformis.    10/22/23 Manual: STM to L glutes pre and post dry needling for trigger point identification and muscular relaxation. Trigger Point Dry Needling  Initial Treatment: Pt instructed on Dry Needling rational, procedures, and possible side effects. Pt instructed to expect mild to moderate muscle soreness later in the day and/or into the next day.  Pt instructed in methods to reduce muscle soreness. Pt instructed to continue prescribed HEP. Patient was educated on signs and symptoms of infection and other risk factors and advised to seek medical attention should they occur.  Patient verbalized understanding of these instructions and education.   Patient Verbal Consent Given: Yes Education Handout Provided: Yes Muscles Treated: L glutes, hip flexor Electrical Stimulation Performed: No Treatment Response/Outcome: twitch response, decrease in tissue tension  Supine active hamstring stretch 5 x 10 second holds Test retest with ambulation throughout Education on continued HEP, DN   10/15/2023: Upright bike 5 minutes, lvl 9 Step up  6 inch 2 x 10 Lateral step up 6 step 2 10 Bridges with SL eccentric control.  Modified thomas stretch to tolerance for hip flexor stretch Side stepping with BTB  Standing hip extension with BTB Standing hip abduction with BTB  Lap around clinic to cool down and assess gait.     PATIENT EDUCATION:  Education details: Patient educated on exam findings, POC, scope of PT, HEP, protocol/precaution, gait mechanics, and dressing change. 09/10/23: HEP, proper activity levels/progressions 10/22/23: HEP, DN Person educated: Patient Education method: Explanation, Demonstration, and Handouts Education  comprehension: verbalized understanding, returned demonstration, verbal cues required, and tactile cues required  HOME EXERCISE PROGRAM: Access Code: [email or web address] URL: [email or web address] Date: 09/05/2023 Prepared by: Prentice Graceson Nichelson  Exercises - Gluteal Sets (Mirrored)  - 3 x daily - 7 x weekly - 2 sets - 10 reps - 5-10 second hold - Supine Quadricep Sets (Mirrored)  - 3 x daily - 7 x weekly - 2 sets - 10 reps - 5-10 second hold - Clamshell (Mirrored)  - 3 x daily - 7 x weekly - 2 sets - 10 reps - Bent Knee Fallouts (Mirrored)  - 3 x daily - 7 x weekly - 2 sets - 10 reps  ASSESSMENT:  CLINICAL IMPRESSION: Began on elliptical for dynamic warm up. Continued with more dynamic exercise today which is tolerated well. Some hip flexor tightness noted. Progression into pyrometrics performed well. Patient will continue to benefit from skilled physical therapy in order to improve function and reduce impairment.     OBJECTIVE IMPAIRMENTS: Abnormal gait, decreased activity tolerance, decreased balance, decreased endurance, decreased mobility, difficulty walking, decreased ROM, decreased strength, increased muscle spasms, impaired flexibility, improper body mechanics, and pain.   ACTIVITY LIMITATIONS: carrying, lifting, bending, standing, squatting, sleeping, stairs, transfers, bed mobility, bathing, toileting, dressing, hygiene/grooming, locomotion level, and caring for others  PARTICIPATION LIMITATIONS: meal prep, cleaning, laundry, driving, shopping, community activity, occupation, and yard work  PERSONAL FACTORS: Time since onset of injury/illness/exacerbation and 3+ comorbidities: HTN, HLD, sarcoidosis of lung are also affecting patient's functional outcome.   REHAB POTENTIAL: Good  CLINICAL DECISION MAKING: Stable/uncomplicated  EVALUATION COMPLEXITY: Low   GOALS: Goals reviewed with patient? Yes  SHORT TERM GOALS: Target date: 10/03/2023    Patient will be independent  with HEP in order to improve functional outcomes. Baseline: Goal status: MET  2.  Patient will report at least 25% improvement in symptoms for improved quality of life. Baseline: Goal status: MET  3.  SLS 30s without compensation Baseline:  Goal status: MET      LONG TERM GOALS: Target date: 11/28/2023    Patient will report at least 75% improvement in symptoms for improved quality of life. Baseline:  Goal status: MET  2.  Patient will improve LEFS score by at least 48 points in order to indicate improved tolerance to activity. Baseline: 12/80 Goal status: INITIAL  3.  Patient will be able to navigate stairs with reciprocal pattern without compensation in order to demonstrate improved LE strength. Baseline:  Goal status: MET  4.  Patient will demonstrate MMT 90% of opposite lower extremity in all tested musculature as evidence of improved strength to assist with stair ambulation and gait. Baseline:  Goal status: INITIAL  5.  Patient will be able to return to all activities unrestricted for improved ability to perform work functions and participate with family.  Baseline:  Goal status: INITIAL    PLAN:  PT FREQUENCY: 1-2x/week  PT DURATION: 6 weeks  PLANNED INTERVENTIONS:  02835- PT Re-evaluation, 97110-Therapeutic exercises, 97530- Therapeutic activity, [id number]- Neuromuscular re-education, [phone]- Self Care, 02859- Manual therapy, [phone]- Gait training, [phone]- Orthotic Fit/training, [phone]- Canalith repositioning, [id number]- Aquatic Therapy, 97760- Splinting, 97597- Wound care (first 20 sq cm), 97598- Wound care (each additional 20 sq cm)Patient/Family education, Balance training, Stair training, Taping, Dry Needling, Joint mobilization, Joint manipulation, Spinal manipulation, Spinal mobilization, Scar mobilization, and DME instructions.  PLAN FOR NEXT SESSION: progress with hip labral repair protocol    Prentice GORMAN Stains, PT, DPT 12/04/2023, 12:30 PM

## 2023-12-04 NOTE — Progress Notes (Signed)
 Post Operative Evaluation    Procedure/Date of Surgery: Left hip arthroscopy with labral repair and cam debridement 6/12  Interval History:    Presents today 12 weeks status post the above procedure.  Overall he is doing extremely well.  Range of motion and strength are coming along nicely.  He has been working on his PhD thesis  PMH/PSH/Family History/Social History/Meds/Allergies:    Past Medical History:  Diagnosis Date   Depression    Environmental allergies    Hyperlipidemia    Hypertension    Sarcoidosis of lung (HCC)    Syncope and collapse    Past Surgical History:  Procedure Laterality Date   LUNG BIOPSY Left    SHOULDER SURGERY Right    SHOULDER SURGERY Left 11/27/2021   WISDOM TOOTH EXTRACTION     Social History   Socioeconomic History   Marital status: Married    Spouse name: Not on file   Number of children: 4   Years of education: Not on file   Highest education level: Master's degree (e.g., MA, MS, MEng, MEd, MSW, MBA)  Occupational History   Occupation: Emergency planning/management officer  Tobacco Use   Smoking status: Some Days    Types: Cigars   Smokeless tobacco: Never  Vaping Use   Vaping status: Never Used  Substance and Sexual Activity   Alcohol use: Yes    Comment: occ   Drug use: Not Currently    Types: Marijuana    Comment: in high school   Sexual activity: Yes  Other Topics Concern   Not on file  Social History Narrative   Not on file   Social Drivers of Health   Financial Resource Strain: Low Risk  (04/12/2023)   Overall Financial Resource Strain (CARDIA)    Difficulty of Paying Living Expenses: Not hard at all  Food Insecurity: No Food Insecurity (04/12/2023)   Hunger Vital Sign    Worried About Running Out of Food in the Last Year: Never true    Ran Out of Food in the Last Year: Never true  Transportation Needs: No Transportation Needs (04/12/2023)   PRAPARE - Administrator, Civil Service  (Medical): No    Lack of Transportation (Non-Medical): No  Physical Activity: Insufficiently Active (04/12/2023)   Exercise Vital Sign    Days of Exercise per Week: 3 days    Minutes of Exercise per Session: 40 min  Stress: Stress Concern Present (04/12/2023)   [employer] of Occupational Health - Occupational Stress Questionnaire    Feeling of Stress : To some extent  Social Connections: Socially Integrated (04/12/2023)   Social Connection and Isolation Panel    Frequency of Communication with Friends and Family: More than three times a week    Frequency of Social Gatherings with Friends and Family: Never    Attends Religious Services: More than 4 times per year    Active Member of [employer] or Organizations: Yes    Attends Engineer, structural: More than 4 times per year    Marital Status: Married   Family History  Problem Relation Age of Onset   Hypertension Mother    Cardiomyopathy Father    Hypertension Sister    Hypertension Sister    Hypertension Brother    Cardiomyopathy Brother    Hyperlipidemia Maternal Grandmother  Hypertension Maternal Grandmother    Stroke Maternal Grandmother    Stroke Maternal Grandfather    No Known Allergies Current Outpatient Medications  Medication Sig Dispense Refill   albuterol  (VENTOLIN  HFA) 108 (90 Base) MCG/ACT inhaler Inhale 1-2 puffs into the lungs every 6 (six) hours as needed for wheezing or shortness of breath. 8 g 1   amLODipine  (NORVASC ) 10 MG tablet Take 1 tablet (10 mg total) by mouth daily. 90 tablet 2   aspirin  EC 325 MG tablet Take 1 tablet (325 mg total) by mouth daily. 14 tablet 0   celecoxib  (CELEBREX ) 200 MG capsule TAKE 1 CAPSULE BY MOUTH TWICE A DAY 60 capsule 0   docusate sodium  (COLACE) 100 MG capsule Take 1 capsule (100 mg total) by mouth 2 (two) times daily as needed for mild constipation. 100 capsule 0   EPINEPHrine  0.3 mg/0.3 mL IJ SOAJ injection Inject into the muscle.     FLUoxetine (PROZAC) 20 MG  capsule Take 20 mg by mouth every morning.     methylPREDNISolone  (MEDROL  DOSEPAK) 4 MG TBPK tablet Take per packet instructions 21 each 0   Multiple Vitamin (MULTI-VITAMIN) tablet Take by mouth.     Omega-3 Fatty Acids (FISH OIL) 300 MG CAPS Take by mouth.     oxyCODONE  (ROXICODONE ) 5 MG immediate release tablet Take 1 tablet (5 mg total) by mouth every 4 (four) hours as needed for severe pain (pain score 7-10) or breakthrough pain. 10 tablet 0   polyethylene glycol powder (GLYCOLAX /MIRALAX ) 17 GM/SCOOP powder Take 17 g by mouth 2 (two) times daily as needed. 3350 g 1   traZODone  (DESYREL ) 50 MG tablet Take 0.5 tablets (25 mg total) by mouth daily as needed for sleep. 30 tablet 1   triamterene -hydrochlorothiazide  (DYAZIDE) 37.5-25 MG capsule TAKE 1 EACH (1 CAPSULE TOTAL) BY MOUTH DAILY. 90 capsule 1   ZEPBOUND 2.5 MG/0.5ML Pen Inject 2.5 mg into the skin once a week.     No current facility-administered medications for this visit.   No results found.  Review of Systems:   A ROS was performed including pertinent positives and negatives as documented in the HPI.   Musculoskeletal Exam:    There were no vitals taken for this visit.  Left hip incisions are well-appearing without erythema or drainage.  30 degrees internal rotation external rotation of the hip without pain.  Walks with mildly antalgic gait.  Distal neurosensory exam is intact  Imaging:      I personally reviewed and interpreted the radiographs.   Assessment:   12 weeks status post left hip arthroscopic labral repair and cam debridement doing well.  At this time I do believe he is overall doing quite well and I will plan to see him back as needed  Plan :    - Return to clinic as needed      I personally saw and evaluated the patient, and participated in the management and treatment plan.  Elspeth Parker, MD Attending Physician, Orthopedic Surgery  This document was dictated using Dragon voice recognition  software. A reasonable attempt at proof reading has been made to minimize errors.

## 2023-12-09 ENCOUNTER — Encounter (HOSPITAL_BASED_OUTPATIENT_CLINIC_OR_DEPARTMENT_OTHER): Admitting: Physical Therapy

## 2023-12-11 ENCOUNTER — Ambulatory Visit (HOSPITAL_BASED_OUTPATIENT_CLINIC_OR_DEPARTMENT_OTHER): Admitting: Physical Therapy

## 2023-12-12 ENCOUNTER — Ambulatory Visit (HOSPITAL_BASED_OUTPATIENT_CLINIC_OR_DEPARTMENT_OTHER): Admitting: Physical Therapy

## 2023-12-12 ENCOUNTER — Encounter (HOSPITAL_BASED_OUTPATIENT_CLINIC_OR_DEPARTMENT_OTHER): Payer: Self-pay | Admitting: Physical Therapy

## 2023-12-12 DIAGNOSIS — M6281 Muscle weakness (generalized): Secondary | ICD-10-CM

## 2023-12-12 DIAGNOSIS — M25552 Pain in left hip: Secondary | ICD-10-CM

## 2023-12-12 DIAGNOSIS — R29898 Other symptoms and signs involving the musculoskeletal system: Secondary | ICD-10-CM | POA: Diagnosis not present

## 2023-12-12 DIAGNOSIS — R2689 Other abnormalities of gait and mobility: Secondary | ICD-10-CM | POA: Diagnosis not present

## 2023-12-12 NOTE — Therapy (Signed)
 OUTPATIENT PHYSICAL THERAPY TREATMENT   Patient Name: Dominic Joseph MRN: [id number] DOB:[birth year], 43 y.o., male Today's Date: 12/12/2023  PHYSICAL THERAPY DISCHARGE SUMMARY  Visits from Start of Care: 19  Current functional level related to goals / functional outcomes: See below   Remaining deficits: See below   Education / Equipment: See below   Patient agrees to discharge. Patient goals were met. Patient is being discharged due to meeting the stated rehab goals.   END OF SESSION:  PT End of Session - 12/12/23 1006     Visit Number 19    Number of Visits 30    Date for Recertification  01/01/24    Authorization Type [employer]    PT Start Time 1002    PT Stop Time 1029    PT Time Calculation (min) 27 min    Activity Tolerance Patient tolerated treatment well    Behavior During Therapy WFL for tasks assessed/performed             Past Medical History:  Diagnosis Date   Depression    Environmental allergies    Hyperlipidemia    Hypertension    Sarcoidosis of lung (HCC)    Syncope and collapse    Past Surgical History:  Procedure Laterality Date   LUNG BIOPSY Left    SHOULDER SURGERY Right    SHOULDER SURGERY Left 11/27/2021   WISDOM TOOTH EXTRACTION     Patient Active Problem List   Diagnosis Date Noted   Neutropenia (HCC) 08/15/2022   Family history of prostate cancer 06/09/2022   Healthcare maintenance 06/09/2022   Visit for suture removal 05/20/2022   Slow transit constipation 05/20/2022   Benign prostatic hyperplasia with urinary hesitancy 05/20/2022   Syncope and collapse    Cluster headaches 07/07/2019   Essential hypertension 07/07/2019   Fatigue 07/07/2019   ADD (attention deficit disorder) 07/07/2019   Severe depression (HCC) 07/07/2019   IBS (irritable colon syndrome) 07/07/2019   Elevated serum creatinine 07/07/2019   Chronic pansinusitis 11/30/2018   Perennial allergic rhinitis 11/30/2018   Tobacco use disorder 01/29/2012    Environmental allergies 01/28/2012   Plantar fascial fibromatosis 12/23/2011   Asthma 07/25/2011   Sarcoidosis 07/25/2011   Pain in joint involving lower leg 06/10/2011   Primary localized osteoarthrosis, lower leg 06/10/2011    PCP: Berneta Elsie Sayre, MD  REFERRING PROVIDER: Genelle Elspeth, MD  REFERRING DIAG: [phone] (ICD-10-CM) - Tear of left acetabular labrum, initial encounter; L hip arthroscopy with labral repair DOS 09/03/23  THERAPY DIAG:  Pain in left hip  Muscle weakness (generalized)  Other abnormalities of gait and mobility  Other symptoms and signs involving the musculoskeletal system  Rationale for Evaluation and Treatment: Rehabilitation  ONSET DATE:  DOS 09/03/23  SUBJECTIVE:   SUBJECTIVE STATEMENT: Patient states doing workouts. Hip flexor symptoms which ease with movement. Doing HEP. States 95% functional status. Remains limited by endurance.   EVAL: Patient states he has not been putting weight on his leg yet with walking. Sleeping in recliner is more comfortable than bed. Using crutches. Normally active: weightlifting, riding bike, etc.   PERTINENT HISTORY: HTN, HLD, sarcoidosis of lung, hx LBP PAIN:  Are you having pain? Yes: NPRS scale: 0/10 Pain location: L hip Pain description: all of the above Aggravating factors: sleeping in bed Relieving factors: rest   PRECAUTIONS: Other: L hip arthroscopy with labral repair   WEIGHT BEARING RESTRICTIONS: WBAT  FALLS:  Has patient fallen in last 6 months? No  OCCUPATION: [employer]  PLOF: Independent  PATIENT GOALS: strengthening, decrease pain, rehab from surgery  OBJECTIVE: (objective measures from initial evaluation unless otherwise dated)  Observation: sutures intact, No s/s of infection  PATIENT SURVEYS:  LEFS  Extreme difficulty/unable (0), Quite a bit of difficulty (1), Moderate difficulty (2), Little difficulty (3), No difficulty (4) Survey date:  09/05/23 11/20/23 12/12/23  Any  of your usual work, housework or school activities 0 4 4  2. Usual hobbies, recreational or sporting activities 0 2 3  3. Getting into/out of the bath 0 4 4  4. Walking between rooms 1 4 4   5. Putting on socks/shoes 1 4 4   6. Squatting  1 3 4   7. Lifting an object, like a bag of groceries from the floor 1 4 3   8. Performing light activities around your home 2 4 4   9. Performing heavy activities around your home 0 3 2  10. Getting into/out of a car 1 3 4   11. Walking 2 blocks 1 2 4   12. Walking 1 mile 0 2 4  13. Going up/down 10 stairs (1 flight) 1 3 4   14. Standing for 1 hour 1 2 3   15.  sitting for 1 hour 1 1 3   16. Running on even ground 0 2 4  17. Running on uneven ground 0 1 3  18. Making sharp turns while running fast 0 1 3  19. Hopping  0 4 4  20. Rolling over in bed 1 3 4   Score total:  12/80 56/80 72/80      COGNITION: Overall cognitive status: Within functional limits for tasks assessed     SENSATION: WFL  POSTURE: No Significant postural limitations  PALPATION: Grossly Tender L hip  LOWER EXTREMITY ROM:  Passive ROM Right eval Left eval  Hip flexion  90  Hip extension    Hip abduction  30  Hip adduction    Hip internal rotation  20  Hip external rotation  20  Knee flexion    Knee extension    Ankle dorsiflexion    Ankle plantarflexion    Ankle inversion    Ankle eversion     (Blank rows = not tested) *= pain/symptoms  LOWER EXTREMITY MMT:  MMT Right eval Left eval Right 11/20/23 Left 11/20/23 Right 12/12/23 Left 12/12/23  Hip flexion   91.4 94.9 120 115  Hip extension   89.4 84.6 106 99.2  Hip abduction   116.9 95 125 127  Hip adduction        Hip internal rotation        Hip external rotation        Knee flexion 5 4- *      Knee extension 5 4      Ankle dorsiflexion        Ankle plantarflexion        Ankle inversion        Ankle eversion         (Blank rows = not tested) *= pain/symptoms   FUNCTIONAL TESTS:  NT due to post op  status  GAIT: Distance walked: 100 feet Assistive device utilized: Crutches Level of assistance: Modified independence Comments: NWB L hip; able to ambulate with bilateral axillary crutches following cueing/education   TODAY'S TREATMENT:  DATE:  12/04/23 Elliptical 5 minutes, level 4 for dynamic warm up and conditioning Reassessment  Walking lunges 3 x 25 feet Lateral shuffle 3 x 25 feet High skipping 3 x 25 feet Jogging 4 x 100 feet    12/04/23 Elliptical 5 minutes, level 4 for dynamic warm up and conditioning Standing hip flexor stretch 5 x 20 second holds Walking lunges 3 x 25 feet Lateral shuffle 3 x 25 feet High skipping 3 x 25 feet Jogging 3 x 100 feet Lateral bounding 3 x 20 Squat jump 3 x 5 Box jump 20 inch x 10   11/27/23 Elliptical 5 minutes, level 4 for dynamic warm up and conditioning Walking lunges 3 x 25 feet Standing hip flexor stretch 5 x 20 second holds Retro sliding lunge 2 x 10  Lateral shuffle 3 x 25 feet Jogging 3 x 25 feet High skipping 3 x 25 feet Karaoke 3 x 25 feet  11/20/23 Elliptical 5 minutes, level 4 for dynamic warm up and conditioning Reassessment Hop onto/off airex 1 x 10 Hip flexor stretch 3 x 20 second holds Manual: stm hip flexors Thomas hip flexor stretch   11/18/23 Elliptical 5 minutes, level 4 for dynamic warm up and conditioning SL hip hinge 15# 3 x 10 Split squat 13# kb 2x10 Curtsy lunge 13# kb 2 x 10 Barbell squat 95# 1 x 10, 135# 2 x 10 Hip flexor stretch 3 x 20 second holds Standing adductor stretch 35 x 10-15 second holds  11/06/23 Elliptical 5 minutes, level 4 for dynamic warm up and conditioning Manual: STM/TPR hip flexors, TFL; grade III PA glides in progressive ER and IR in prone SL hip hinge 10# 3 x 10 Standing hip flexor stretch 3 x 20-30 second holds Lunge 1 x 10  11/04/23 Elliptical 5  minutes, level 4 for dynamic warm up and conditioning STS with LLE bias 15# 2 x 10 Hip hinge 15# 1 x 15 SL hip hinge 3 x 8 Mini squat slide out 3 way 2 x 5  Manual: STM/TPR hip flexors, TFL Education on self STM  10/28/23 Elliptical 5 minutes, level 4 for dynamic warm up and conditioning Squat 1 x 20, 15# 1 x 20 Mini squat slide out 3 way 2 x 5  Hip hinge 15# 2 x 15 Manual: STM/TPR to glutes and piriformis Shuttle DL 899# 1 x 15, SL 24# 1 x 15, sidelying 50# 1 x 15 Supine thomas stretch 2 x 30 second holds Supine figure 4 stretch 4 x 20 second holds Supine active hamstring stretch 3 x 10 second holds  10/26/23: Upright bike 5 minutes, lvl 9 Seated HS stretch  Modified thomas stretch  Bridges 2x 10  Quadruped rocking  Quadruped cat/cow  Supine figure 4 stretch  STM to HS with muscle bending and pin and stretch Cross friction massage to piriformis.    10/22/23 Manual: STM to L glutes pre and post dry needling for trigger point identification and muscular relaxation. Trigger Point Dry Needling  Initial Treatment: Pt instructed on Dry Needling rational, procedures, and possible side effects. Pt instructed to expect mild to moderate muscle soreness later in the day and/or into the next day.  Pt instructed in methods to reduce muscle soreness. Pt instructed to continue prescribed HEP. Patient was educated on signs and symptoms of infection and other risk factors and advised to seek medical attention should they occur.  Patient verbalized understanding of these instructions and education.   Patient Verbal Consent Given: Yes Education Handout  Provided: Yes Muscles Treated: L glutes, hip flexor Electrical Stimulation Performed: No Treatment Response/Outcome: twitch response, decrease in tissue tension  Supine active hamstring stretch 5 x 10 second holds Test retest with ambulation throughout Education on continued HEP, DN   10/15/2023: Upright bike 5 minutes, lvl 9 Step up  6 inch 2 x 10 Lateral step up 6 step 2 10 Bridges with SL eccentric control.  Modified thomas stretch to tolerance for hip flexor stretch Side stepping with BTB  Standing hip extension with BTB Standing hip abduction with BTB  Lap around clinic to cool down and assess gait.     PATIENT EDUCATION:  Education details: Patient educated on exam findings, POC, scope of PT, HEP, protocol/precaution, gait mechanics, and dressing change. 09/10/23: HEP, proper activity levels/progressions 10/22/23: HEP, DN 12/12/23: reassessment findings, returning to PT if needed, HEP Person educated: Patient Education method: Explanation, Demonstration, and Handouts Education comprehension: verbalized understanding, returned demonstration, verbal cues required, and tactile cues required  HOME EXERCISE PROGRAM: Access Code: [facility] URL: [email or web address] Date: 09/05/2023 Prepared by: Prentice Atticus Lemberger  Exercises - Gluteal Sets (Mirrored)  - 3 x daily - 7 x weekly - 2 sets - 10 reps - 5-10 second hold - Supine Quadricep Sets (Mirrored)  - 3 x daily - 7 x weekly - 2 sets - 10 reps - 5-10 second hold - Clamshell (Mirrored)  - 3 x daily - 7 x weekly - 2 sets - 10 reps - Bent Knee Fallouts (Mirrored)  - 3 x daily - 7 x weekly - 2 sets - 10 reps  ASSESSMENT:  CLINICAL IMPRESSION: Began on elliptical for dynamic warm up. Patient has met 3/3 short term goals and 5/5 long term goals with ability to complete HEP and improvement in symptoms, strength, motor control, ROM, activity tolerance, gait, balance, and functional mobility. Discussed continued HEP and gym routine and returning to PT if needed. Patient discharged from PT.     OBJECTIVE IMPAIRMENTS: Abnormal gait, decreased activity tolerance, decreased balance, decreased endurance, decreased mobility, difficulty walking, decreased ROM, decreased strength, increased muscle spasms, impaired flexibility, improper body mechanics, and pain.    ACTIVITY LIMITATIONS: carrying, lifting, bending, standing, squatting, sleeping, stairs, transfers, bed mobility, bathing, toileting, dressing, hygiene/grooming, locomotion level, and caring for others  PARTICIPATION LIMITATIONS: meal prep, cleaning, laundry, driving, shopping, community activity, occupation, and yard work  PERSONAL FACTORS: Time since onset of injury/illness/exacerbation and 3+ comorbidities: HTN, HLD, sarcoidosis of lung are also affecting patient's functional outcome.   REHAB POTENTIAL: Good  CLINICAL DECISION MAKING: Stable/uncomplicated  EVALUATION COMPLEXITY: Low   GOALS: Goals reviewed with patient? Yes  SHORT TERM GOALS: Target date: 10/03/2023    Patient will be independent with HEP in order to improve functional outcomes. Baseline: Goal status: MET  2.  Patient will report at least 25% improvement in symptoms for improved quality of life. Baseline: Goal status: MET  3.  SLS 30s without compensation Baseline:  Goal status: MET      LONG TERM GOALS: Target date: 11/28/2023    Patient will report at least 75% improvement in symptoms for improved quality of life. Baseline:  Goal status: MET  2.  Patient will improve LEFS score by at least 48 points in order to indicate improved tolerance to activity. Baseline: 12/80 Goal status: MET  3.  Patient will be able to navigate stairs with reciprocal pattern without compensation in order to demonstrate improved LE strength. Baseline:  Goal status: MET  4.  Patient will demonstrate MMT 90%  of opposite lower extremity in all tested musculature as evidence of improved strength to assist with stair ambulation and gait. Baseline:  Goal status: MET  5.  Patient will be able to return to all activities unrestricted for improved ability to perform work functions and participate with family.  Baseline:  Goal status: MET    PLAN:  PT FREQUENCY: 1-2x/week  PT DURATION: 6 weeks  PLANNED  INTERVENTIONS: 97164- PT Re-evaluation, 97110-Therapeutic exercises, 97530- Therapeutic activity, [id number]- Neuromuscular re-education, 97535- Self Care, [id number]- Manual therapy, [id number]- Gait training, [phone]- Orthotic Fit/training, [phone]- Canalith repositioning, [id number]- Aquatic Therapy, [phone]- Splinting, [phone]- Wound care (first 20 sq cm), 97598- Wound care (each additional 20 sq cm)Patient/Family education, Balance training, Stair training, Taping, Dry Needling, Joint mobilization, Joint manipulation, Spinal manipulation, Spinal mobilization, Scar mobilization, and DME instructions.  PLAN FOR NEXT SESSION: progress with hip labral repair protocol    Prentice GORMAN Stains, PT, DPT 12/12/2023, 10:29 AM

## 2023-12-18 ENCOUNTER — Encounter (HOSPITAL_BASED_OUTPATIENT_CLINIC_OR_DEPARTMENT_OTHER): Admitting: Physical Therapy

## 2023-12-23 ENCOUNTER — Encounter (HOSPITAL_BASED_OUTPATIENT_CLINIC_OR_DEPARTMENT_OTHER): Payer: Self-pay | Admitting: Physical Therapy

## 2024-01-02 ENCOUNTER — Encounter (HOSPITAL_BASED_OUTPATIENT_CLINIC_OR_DEPARTMENT_OTHER): Payer: Self-pay | Admitting: Physical Therapy

## 2024-01-04 ENCOUNTER — Encounter (HOSPITAL_BASED_OUTPATIENT_CLINIC_OR_DEPARTMENT_OTHER): Admitting: Physical Therapy

## 2024-01-11 ENCOUNTER — Encounter (HOSPITAL_BASED_OUTPATIENT_CLINIC_OR_DEPARTMENT_OTHER): Admitting: Physical Therapy

## 2024-01-18 ENCOUNTER — Other Ambulatory Visit: Payer: Self-pay

## 2024-01-18 ENCOUNTER — Encounter (HOSPITAL_BASED_OUTPATIENT_CLINIC_OR_DEPARTMENT_OTHER): Admitting: Physical Therapy

## 2024-01-18 ENCOUNTER — Other Ambulatory Visit (HOSPITAL_BASED_OUTPATIENT_CLINIC_OR_DEPARTMENT_OTHER): Payer: Self-pay

## 2024-01-18 DIAGNOSIS — F411 Generalized anxiety disorder: Secondary | ICD-10-CM | POA: Diagnosis not present

## 2024-01-18 DIAGNOSIS — F4312 Post-traumatic stress disorder, chronic: Secondary | ICD-10-CM | POA: Diagnosis not present

## 2024-01-18 DIAGNOSIS — F332 Major depressive disorder, recurrent severe without psychotic features: Secondary | ICD-10-CM | POA: Diagnosis not present

## 2024-01-18 MED ORDER — FLUOXETINE HCL 40 MG PO CAPS
40.0000 mg | ORAL_CAPSULE | Freq: Every morning | ORAL | 1 refills | Status: AC
  Filled 2024-01-18 – 2024-01-19 (×2): qty 90, 90d supply, fill #0

## 2024-01-18 MED ORDER — TRAZODONE HCL 50 MG PO TABS
25.0000 mg | ORAL_TABLET | ORAL | 1 refills | Status: AC | PRN
  Filled 2024-01-18: qty 90, 90d supply, fill #0

## 2024-01-19 ENCOUNTER — Other Ambulatory Visit (HOSPITAL_BASED_OUTPATIENT_CLINIC_OR_DEPARTMENT_OTHER): Payer: Self-pay

## 2024-01-22 ENCOUNTER — Telehealth: Admitting: Physician Assistant

## 2024-01-22 ENCOUNTER — Other Ambulatory Visit (HOSPITAL_BASED_OUTPATIENT_CLINIC_OR_DEPARTMENT_OTHER): Payer: Self-pay

## 2024-01-22 DIAGNOSIS — J019 Acute sinusitis, unspecified: Secondary | ICD-10-CM | POA: Diagnosis not present

## 2024-01-22 DIAGNOSIS — B9689 Other specified bacterial agents as the cause of diseases classified elsewhere: Secondary | ICD-10-CM

## 2024-01-22 MED ORDER — AMOXICILLIN-POT CLAVULANATE 875-125 MG PO TABS
1.0000 | ORAL_TABLET | Freq: Two times a day (BID) | ORAL | 0 refills | Status: AC
  Filled 2024-01-22: qty 14, 7d supply, fill #0

## 2024-01-22 MED ORDER — PREDNISONE 20 MG PO TABS
40.0000 mg | ORAL_TABLET | Freq: Every day | ORAL | 0 refills | Status: AC
  Filled 2024-01-22: qty 10, 5d supply, fill #0

## 2024-01-22 NOTE — Progress Notes (Signed)
 E-Visit for Sinus Problems  We are sorry that you are not feeling well.  Here is how we plan to help!  Based on what you have shared with me it looks like you have sinusitis.  Sinusitis is inflammation and infection in the sinus cavities of the head.  Based on your presentation I believe you most likely have Acute Bacterial Sinusitis.  This is an infection caused by bacteria and is treated with antibiotics. I have prescribed Augmentin 875mg /125mg  one tablet twice daily with food, for 7 days. I have also prescribed Prednisone  20mg  Take 2 tablets (40mg  ) for 5 days. You may use an oral decongestant such as Mucinex D or if you have glaucoma or high blood pressure use plain Mucinex. Saline nasal spray help and can safely be used as often as needed for congestion.  If you develop worsening sinus pain, fever or notice severe headache and vision changes, or if symptoms are not better after completion of antibiotic, please schedule an appointment with a health care provider.    Sinus infections are not as easily transmitted as other respiratory infection, however we still recommend that you avoid close contact with loved ones, especially the very young and elderly.  Remember to wash your hands thoroughly throughout the day as this is the number one way to prevent the spread of infection!  Home Care: Only take medications as instructed by your medical team. Complete the entire course of an antibiotic. Do not take these medications with alcohol. A steam or ultrasonic humidifier can help congestion.  You can place a towel over your head and breathe in the steam from hot water coming from a faucet. Avoid close contacts especially the very young and the elderly. Cover your mouth when you cough or sneeze. Always remember to wash your hands.  Get Help Right Away If: You develop worsening fever or sinus pain. You develop a severe head ache or visual changes. Your symptoms persist after you have completed  your treatment plan.  Make sure you Understand these instructions. Will watch your condition. Will get help right away if you are not doing well or get worse.  Your e-visit answers were reviewed by a board certified advanced clinical practitioner to complete your personal care plan.  Depending on the condition, your plan could have included both over the counter or prescription medications.  If there is a problem please reply  once you have received a response from your provider.  Your safety is important to us .  If you have drug allergies check your prescription carefully.    You can use MyChart to ask questions about today's visit, request a non-urgent call back, or ask for a work or school excuse for 24 hours related to this e-Visit. If it has been greater than 24 hours you will need to follow up with your provider, or enter a new e-Visit to address those concerns.  You will get an e-mail in the next two days asking about your experience.  I hope that your e-visit has been valuable and will speed your recovery. Thank you for using e-visits.  I have spent 5 minutes in review of e-visit questionnaire, review and updating patient chart, medical decision making and response to patient.   Delon CHRISTELLA Dickinson, PA-C

## 2024-01-25 ENCOUNTER — Encounter: Payer: Self-pay | Admitting: Radiology

## 2024-02-11 DIAGNOSIS — F339 Major depressive disorder, recurrent, unspecified: Secondary | ICD-10-CM | POA: Diagnosis not present

## 2024-02-11 DIAGNOSIS — F4312 Post-traumatic stress disorder, chronic: Secondary | ICD-10-CM | POA: Diagnosis not present

## 2024-02-11 DIAGNOSIS — F4325 Adjustment disorder with mixed disturbance of emotions and conduct: Secondary | ICD-10-CM | POA: Diagnosis not present

## 2024-03-07 DIAGNOSIS — F339 Major depressive disorder, recurrent, unspecified: Secondary | ICD-10-CM | POA: Diagnosis not present

## 2024-03-07 DIAGNOSIS — F4312 Post-traumatic stress disorder, chronic: Secondary | ICD-10-CM | POA: Diagnosis not present

## 2024-03-30 ENCOUNTER — Other Ambulatory Visit (HOSPITAL_BASED_OUTPATIENT_CLINIC_OR_DEPARTMENT_OTHER): Payer: Self-pay | Admitting: Orthopaedic Surgery

## 2024-03-30 ENCOUNTER — Ambulatory Visit (HOSPITAL_BASED_OUTPATIENT_CLINIC_OR_DEPARTMENT_OTHER): Admitting: Orthopaedic Surgery

## 2024-03-30 ENCOUNTER — Telehealth (HOSPITAL_BASED_OUTPATIENT_CLINIC_OR_DEPARTMENT_OTHER): Payer: Self-pay | Admitting: Orthopaedic Surgery

## 2024-03-30 ENCOUNTER — Other Ambulatory Visit (HOSPITAL_BASED_OUTPATIENT_CLINIC_OR_DEPARTMENT_OTHER): Payer: Self-pay

## 2024-03-30 DIAGNOSIS — G5601 Carpal tunnel syndrome, right upper limb: Secondary | ICD-10-CM | POA: Diagnosis not present

## 2024-03-30 MED ORDER — MELOXICAM 15 MG PO TABS
15.0000 mg | ORAL_TABLET | Freq: Every day | ORAL | 0 refills | Status: AC
  Filled 2024-03-30: qty 30, 30d supply, fill #0

## 2024-03-30 NOTE — Telephone Encounter (Signed)
 Patient wants to know what can he do about pain unit Feb when he see Dr Erwin. Please advise via myhart

## 2024-03-30 NOTE — Progress Notes (Signed)
 "                                Post Operative Evaluation    Interval History:    Presents today for follow-up of his right hand.  He has been previously diagnosed with carpal tunnel syndrome.  He did have an EMG nerve conduction test consistent with this with Dr. Eldonna.  PMH/PSH/Family History/Social History/Meds/Allergies:    Past Medical History:  Diagnosis Date   Depression    Environmental allergies    Hyperlipidemia    Hypertension    Sarcoidosis of lung    Syncope and collapse    Past Surgical History:  Procedure Laterality Date   LUNG BIOPSY Left    SHOULDER SURGERY Right    SHOULDER SURGERY Left 11/27/2021   WISDOM TOOTH EXTRACTION     Social History   Socioeconomic History   Marital status: Married    Spouse name: Not on file   Number of children: 4   Years of education: Not on file   Highest education level: Master's degree (e.g., MA, MS, MEng, MEd, MSW, MBA)  Occupational History   Occupation: Emergency planning/management officer  Tobacco Use   Smoking status: Some Days    Types: Cigars   Smokeless tobacco: Never  Vaping Use   Vaping status: Never Used  Substance and Sexual Activity   Alcohol use: Yes    Comment: occ   Drug use: Not Currently    Types: Marijuana    Comment: in high school   Sexual activity: Yes  Other Topics Concern   Not on file  Social History Narrative   Not on file   Social Drivers of Health   Tobacco Use: High Risk (12/12/2023)   Patient History    Smoking Tobacco Use: Some Days    Smokeless Tobacco Use: Never    Passive Exposure: Not on file  Financial Resource Strain: Low Risk (04/12/2023)   Overall Financial Resource Strain (CARDIA)    Difficulty of Paying Living Expenses: Not hard at all  Food Insecurity: No Food Insecurity (04/12/2023)   Hunger Vital Sign    Worried About Running Out of Food in the Last Year: Never true    Ran Out of Food in the Last Year: Never true  Transportation Needs: No Transportation Needs (04/12/2023)    PRAPARE - Administrator, Civil Service (Medical): No    Lack of Transportation (Non-Medical): No  Physical Activity: Insufficiently Active (04/12/2023)   Exercise Vital Sign    Days of Exercise per Week: 3 days    Minutes of Exercise per Session: 40 min  Stress: Stress Concern Present (04/12/2023)   [employer] of Occupational Health - Occupational Stress Questionnaire    Feeling of Stress : To some extent  Social Connections: Socially Integrated (04/12/2023)   Social Connection and Isolation Panel    Frequency of Communication with Friends and Family: More than three times a week    Frequency of Social Gatherings with Friends and Family: Never    Attends Religious Services: More than 4 times per year    Active Member of Clubs or Organizations: Yes    Attends Banker Meetings: More than 4 times per year    Marital Status: Married  Depression (PHQ2-9): High Risk (06/11/2023)   Depression (PHQ2-9)    PHQ-2 Score: 13  Alcohol Screen: Low Risk (04/12/2023)   Alcohol Screen    Last Alcohol  Screening Score (AUDIT): 1  Housing: Unknown (04/12/2023)   Housing Stability Vital Sign    Unable to Pay for Housing in the Last Year: No    Number of Times Moved in the Last Year: Not on file    Homeless in the Last Year: No  Utilities: Not on file  Health Literacy: Not on file   Family History  Problem Relation Age of Onset   Hypertension Mother    Cardiomyopathy Father    Hypertension Sister    Hypertension Sister    Hypertension Brother    Cardiomyopathy Brother    Hyperlipidemia Maternal Grandmother    Hypertension Maternal Grandmother    Stroke Maternal Grandmother    Stroke Maternal Grandfather    No Known Allergies Current Outpatient Medications  Medication Sig Dispense Refill   albuterol  (VENTOLIN  HFA) 108 (90 Base) MCG/ACT inhaler Inhale 1-2 puffs into the lungs every 6 (six) hours as needed for wheezing or shortness of breath. 8 g 1   amLODipine   (NORVASC ) 10 MG tablet Take 1 tablet (10 mg total) by mouth daily. 90 tablet 2   amoxicillin -clavulanate (AUGMENTIN ) 875-125 MG tablet Take 1 tablet by mouth 2 (two) times daily. 14 tablet 0   aspirin  EC 325 MG tablet Take 1 tablet (325 mg total) by mouth daily. 14 tablet 0   celecoxib  (CELEBREX ) 200 MG capsule TAKE 1 CAPSULE BY MOUTH TWICE A DAY 60 capsule 0   docusate sodium  (COLACE) 100 MG capsule Take 1 capsule (100 mg total) by mouth 2 (two) times daily as needed for mild constipation. 100 capsule 0   EPINEPHrine  0.3 mg/0.3 mL IJ SOAJ injection Inject into the muscle.     FLUoxetine  (PROZAC ) 20 MG capsule Take 20 mg by mouth every morning.     FLUoxetine  (PROZAC ) 40 MG capsule Take 1 capsule (40 mg total) by mouth in the morning. 90 capsule 1   Multiple Vitamin (MULTI-VITAMIN) tablet Take by mouth.     Omega-3 Fatty Acids (FISH OIL) 300 MG CAPS Take by mouth.     oxyCODONE  (ROXICODONE ) 5 MG immediate release tablet Take 1 tablet (5 mg total) by mouth every 4 (four) hours as needed for severe pain (pain score 7-10) or breakthrough pain. 10 tablet 0   polyethylene glycol powder (GLYCOLAX /MIRALAX ) 17 GM/SCOOP powder Take 17 g by mouth 2 (two) times daily as needed. 3350 g 1   predniSONE  (DELTASONE ) 20 MG tablet Take 2 tablets (40 mg total) by mouth daily with breakfast. 10 tablet 0   traZODone  (DESYREL ) 50 MG tablet Take 0.5 tablets (25 mg total) by mouth daily as needed for sleep. 30 tablet 1   traZODone  (DESYREL ) 50 MG tablet Take 0.5-1 tablets (25-50 mg total) by mouth as needed for sleep. 90 tablet 1   triamterene -hydrochlorothiazide  (DYAZIDE) 37.5-25 MG capsule TAKE 1 EACH (1 CAPSULE TOTAL) BY MOUTH DAILY. 90 capsule 1   ZEPBOUND 2.5 MG/0.5ML Pen Inject 2.5 mg into the skin once a week.     No current facility-administered medications for this visit.   No results found.  Review of Systems:   A ROS was performed including pertinent positives and negatives as documented in the  HPI.   Musculoskeletal Exam:    There were no vitals taken for this visit.  Left hip incisions are well-appearing without erythema or drainage.  30 degrees internal rotation external rotation of the hip without pain.  Walks with mildly antalgic gait.  Distal neurosensory exam is intact  Right wrist with positive  Durkan maneuver as well as Tinel and compression.  Imaging:      I personally reviewed and interpreted the radiographs.   Assessment:   Status post left hip arthroscopic labral repair and cam debridement doing well.  He does have evidence of carpal tunnel syndrome at today's visit.  EMG is consistent with moderate carpal tunnel syndrome.  Given this we will plan for referral to Dr. Erwin as he has trialed splinting in the past without relief  Plan :    - Return to clinic as needed      I personally saw and evaluated the patient, and participated in the management and treatment plan.  Elspeth Parker, MD Attending Physician, Orthopedic Surgery  This document was dictated using Dragon voice recognition software. A reasonable attempt at proof reading has been made to minimize errors. "

## 2024-04-25 ENCOUNTER — Ambulatory Visit: Admitting: Orthopedic Surgery

## 2024-04-28 ENCOUNTER — Ambulatory Visit: Admitting: Orthopedic Surgery

## 2024-04-28 DIAGNOSIS — G5601 Carpal tunnel syndrome, right upper limb: Secondary | ICD-10-CM

## 2024-04-28 NOTE — Progress Notes (Signed)
 "     Dominic Joseph - 44 y.o. male MRN [id number]  Date of birth: 07-Jul-1980  Office Visit Note: Visit Date: 04/28/2024 PCP: Berneta Elsie Sayre, MD Referred by: Berneta Elsie Sayre, MD  Subjective: No chief complaint on file.  HPI: Dominic Joseph is a pleasant 44 y.o. male who presents today as a referral from Dr.Bokshan for ongoing carpal tunnel syndrome refractory to conservative care.  Patient has both clinical and electrodiagnostic evidence to confirm his diagnosis.  He has trialed wrist bracing extensively with activity modification and nonsteroidal anti-inflammatories, symptoms have remained refractory to conservative care.  He is here today to discuss potential surgical options.  Pertinent ROS were reviewed with the patient and found to be negative unless otherwise specified above in HPI.   Visit Reason: Right carpal tunnel syndrome Duration of symptoms: 3 months Hand dominance: right Occupation: geologist, engineering Diabetic: No Smoking: Yes Heart/Lung History: none Blood Thinners: none  Prior Testing/EMG: EMG Injections (Date): None Treatments: Brace, meloxicam   Prior Surgery: none  Assessment & Plan: Visit Diagnoses:  1. Carpal tunnel syndrome of right wrist     Plan: Extensive discussion was had with the patient today about his ongoing right sided carpal tunnel syndrome that is refractory to conservative care.  We discussed the underlying etiology and pathophysiology of carpal tunnel syndrome.  We did discuss conservative treatments at length.  Patient has both clinical and electrodiagnostic evidence to confirm this diagnosis.  At this juncture, given that the symptoms remain refractory to conservative care, he is indicated for right open versus endoscopic carpal tunnel release.  Risks and benefits of both operations as well as forms of anesthesia were discussed in detail today.  Understanding all risks and benefits, patient would like to have surgery done in the form of  right open carpal tunnel release under local anesthesia at the next available date.  Risks include but not limited to infection, bleeding, scarring, stiffness, nerve injury or vascular, tendon injury, risk of recurrence and need for subsequent operation were all discussed in detail.  Patient consented understanding the above.  Will move forward surgical scheduling.   Follow-up: No follow-ups on file.   Meds & Orders: No orders of the defined types were placed in this encounter.  No orders of the defined types were placed in this encounter.    Procedures: No procedures performed      Clinical History: No specialty comments available.  He reports that he has been smoking cigars. He has never used smokeless tobacco.  Recent Labs    06/19/23 0822  HGBA1C 5.5    Objective:   Vital Signs: There were no vitals taken for this visit.  Physical Exam  Gen: Well-appearing, in no acute distress; non-toxic CV: Regular Rate. Well-perfused. Warm.  Resp: Breathing unlabored on room air; no wheezing. Psych: Fluid speech in conversation; appropriate affect; normal thought process  Ortho Exam PHYSICAL EXAM:  General: Patient is well appearing and in no distress.   Skin and Muscle: No significant skin changes are apparent to upper extremities.   Range of Motion and Palpation Tests: Mobility is full about the elbows with flexion and extension. Forearm supination and pronation are 85/85 bilaterally.  Wrist flexion/extension is 75/65 bilaterally.  Digital flexion and extension are full.  Thumb opposition is full to the base of the small fingers bilaterally.    No cords or nodules are palpated.  No triggering is observed.     Neurologic, Vascular, Motor: Sensation is diminished to light  touch in the right median nerve distribution.    Thenar atrophy: Negative right Tinel sign: Positive right carpal tunnel Carpal tunnel compression: Positive right Phalen test: Positive right  Motor right  hand FPL: 5/5 Index FDP: 5/5 APB: 4/5  Fingers pink and well perfused.  Capillary refill is brisk.     Lab Results  Component Value Date   HGBA1C 5.5 06/19/2023      Imaging: No results found.  Past Medical/Family/Surgical/Social History: Medications & Allergies reviewed per EMR, new medications updated. Patient Active Problem List   Diagnosis Date Noted   Neutropenia 08/15/2022   Family history of prostate cancer 06/09/2022   Healthcare maintenance 06/09/2022   Visit for suture removal 05/20/2022   Slow transit constipation 05/20/2022   Benign prostatic hyperplasia with urinary hesitancy 05/20/2022   Syncope and collapse    Cluster headaches 07/07/2019   Essential hypertension 07/07/2019   Fatigue 07/07/2019   ADD (attention deficit disorder) 07/07/2019   Severe depression (HCC) 07/07/2019   IBS (irritable colon syndrome) 07/07/2019   Elevated serum creatinine 07/07/2019   Chronic pansinusitis 11/30/2018   Perennial allergic rhinitis 11/30/2018   Tobacco use disorder 01/29/2012   Environmental allergies 01/28/2012   Plantar fascial fibromatosis 12/23/2011   Asthma 07/25/2011   Sarcoidosis 07/25/2011   Pain in joint involving lower leg 06/10/2011   Primary localized osteoarthrosis, lower leg 06/10/2011   Past Medical History:  Diagnosis Date   Depression    Environmental allergies    Hyperlipidemia    Hypertension    Sarcoidosis of lung    Syncope and collapse    Family History  Problem Relation Age of Onset   Hypertension Mother    Cardiomyopathy Father    Hypertension Sister    Hypertension Sister    Hypertension Brother    Cardiomyopathy Brother    Hyperlipidemia Maternal Grandmother    Hypertension Maternal Grandmother    Stroke Maternal Grandmother    Stroke Maternal Grandfather    Past Surgical History:  Procedure Laterality Date   LUNG BIOPSY Left    SHOULDER SURGERY Right    SHOULDER SURGERY Left 11/27/2021   WISDOM TOOTH EXTRACTION      Social History   Occupational History   Occupation: Emergency planning/management officer  Tobacco Use   Smoking status: Some Days    Types: Cigars   Smokeless tobacco: Never  Vaping Use   Vaping status: Never Used  Substance and Sexual Activity   Alcohol use: Yes    Comment: occ   Drug use: Not Currently    Types: Marijuana    Comment: in high school   Sexual activity: Yes    Norval Slaven Estela) Arlinda, M.D. [facility], Hand Surgery  "

## 2024-06-13 ENCOUNTER — Encounter: Admitting: Family Medicine
